# Patient Record
Sex: Male | Born: 1940 | Race: White | Hispanic: No | Marital: Married | Smoking: Former smoker
Health system: Southern US, Community
[De-identification: ages and names within clinical notes are randomized; demographics above are authoritative.]

## PROBLEM LIST (undated history)

## (undated) DIAGNOSIS — E7800 Pure hypercholesterolemia, unspecified: Secondary | ICD-10-CM

## (undated) DIAGNOSIS — E119 Type 2 diabetes mellitus without complications: Secondary | ICD-10-CM

## (undated) DIAGNOSIS — M109 Gout, unspecified: Secondary | ICD-10-CM

## (undated) DIAGNOSIS — K769 Liver disease, unspecified: Secondary | ICD-10-CM

## (undated) DIAGNOSIS — H9190 Unspecified hearing loss, unspecified ear: Secondary | ICD-10-CM

## (undated) DIAGNOSIS — I2510 Atherosclerotic heart disease of native coronary artery without angina pectoris: Secondary | ICD-10-CM

## (undated) DIAGNOSIS — G4739 Other sleep apnea: Secondary | ICD-10-CM

## (undated) DIAGNOSIS — I10 Essential (primary) hypertension: Secondary | ICD-10-CM

## (undated) DIAGNOSIS — N289 Disorder of kidney and ureter, unspecified: Secondary | ICD-10-CM

## (undated) DIAGNOSIS — I519 Heart disease, unspecified: Secondary | ICD-10-CM

## (undated) DIAGNOSIS — C61 Malignant neoplasm of prostate: Secondary | ICD-10-CM

## (undated) DIAGNOSIS — G4731 Primary central sleep apnea: Secondary | ICD-10-CM

## (undated) HISTORY — DX: Pure hypercholesterolemia, unspecified: E78.00

## (undated) HISTORY — DX: Gout, unspecified: M10.9

## (undated) HISTORY — PX: TONSILLECTOMY: SUR1361

## (undated) HISTORY — DX: Disorder of kidney and ureter, unspecified: N28.9

## (undated) HISTORY — DX: Liver disease, unspecified: K76.9

## (undated) HISTORY — PX: DOPPLER ECHOCARDIOGRAPHY: SHX263

## (undated) HISTORY — DX: Malignant neoplasm of prostate: C61

## (undated) HISTORY — DX: Essential (primary) hypertension: I10

## (undated) HISTORY — PX: CARDIAC CATHETERIZATION: SHX172

## (undated) HISTORY — DX: Other sleep apnea: G47.39

## (undated) HISTORY — DX: Type 2 diabetes mellitus without complications: E11.9

## (undated) HISTORY — DX: Atherosclerotic heart disease of native coronary artery without angina pectoris: I25.10

## (undated) HISTORY — DX: Heart disease, unspecified: I51.9

## (undated) HISTORY — DX: Primary central sleep apnea: G47.31

## (undated) HISTORY — PX: COLONOSCOPY: SHX174

---

## 1984-12-10 HISTORY — PX: CORONARY ARTERY BYPASS GRAFT: SHX141

## 1998-06-27 ENCOUNTER — Ambulatory Visit (HOSPITAL_COMMUNITY): Admission: RE | Admit: 1998-06-27 | Discharge: 1998-06-27 | Payer: Self-pay | Admitting: Cardiology

## 2004-02-17 ENCOUNTER — Ambulatory Visit (HOSPITAL_COMMUNITY): Admission: RE | Admit: 2004-02-17 | Discharge: 2004-02-17 | Payer: Self-pay | Admitting: Gastroenterology

## 2005-12-10 HISTORY — PX: HEMORRHOID SURGERY: SHX153

## 2006-10-07 ENCOUNTER — Encounter (INDEPENDENT_AMBULATORY_CARE_PROVIDER_SITE_OTHER): Payer: Self-pay | Admitting: *Deleted

## 2006-10-07 ENCOUNTER — Ambulatory Visit (HOSPITAL_COMMUNITY): Admission: RE | Admit: 2006-10-07 | Discharge: 2006-10-07 | Payer: Self-pay | Admitting: General Surgery

## 2006-12-10 HISTORY — PX: CARDIOVASCULAR STRESS TEST: SHX262

## 2009-06-22 ENCOUNTER — Ambulatory Visit (HOSPITAL_COMMUNITY): Admission: RE | Admit: 2009-06-22 | Discharge: 2009-06-22 | Payer: Self-pay | Admitting: Cardiology

## 2009-12-10 HISTORY — PX: CHOLECYSTECTOMY: SHX55

## 2010-01-04 ENCOUNTER — Ambulatory Visit (HOSPITAL_COMMUNITY): Admission: RE | Admit: 2010-01-04 | Discharge: 2010-01-05 | Payer: Self-pay | Admitting: Surgery

## 2010-01-04 ENCOUNTER — Encounter (INDEPENDENT_AMBULATORY_CARE_PROVIDER_SITE_OTHER): Payer: Self-pay | Admitting: Surgery

## 2011-02-25 LAB — COMPREHENSIVE METABOLIC PANEL
AST: 18 U/L (ref 0–37)
Albumin: 3.9 g/dL (ref 3.5–5.2)
Alkaline Phosphatase: 85 U/L (ref 39–117)
Chloride: 106 mEq/L (ref 96–112)
GFR calc Af Amer: 59 mL/min — ABNORMAL LOW (ref >60)
Potassium: 3.8 mEq/L (ref 3.5–5.1)
Total Bilirubin: 0.5 mg/dL (ref 0.3–1.2)

## 2011-04-24 NOTE — Cardiovascular Report (Signed)
NAMEELIJIAH, Bruce Gray NO.:  [id number]   MEDICAL RECORD NO.:  [id number]          PATIENT TYPE:  OIB   LOCATION:  2899                         FACILITY:  [facility]   PHYSICIAN:  Thereasa Solo. Little, M.D. DATE OF BIRTH:  Jun 25, 1941   DATE OF PROCEDURE:  06/22/2009  DATE OF DISCHARGE:                            CARDIAC CATHETERIZATION   INDICATIONS FOR TEST:  This 70 year old male had bypass surgery in 1986.  He had a repeat cath in 1996 because of chest burning.  His grafts were  patent.  He presented to my office as a work-in with chest discomfort 2  days ago with recurrent burning in his chest.  There is no associated  shortness of breath or awareness of any arrhythmias with this and since  there is clearly a change in his symptoms.   Because of this, the patient is brought in for cardiac catheterization.   PROCEDURE:  After obtaining informed consent, the patient was prepped  and draped in the usual sterile fashion exposing the right groin and  local anesthetic with 1% Xylocaine and Seldinger technique employed, a 5-  [place] introducer sheath was placed in the right femoral artery.  Left  and right coronary arteriography, graft visualization x3,  ventriculography, and aortic root injection x3 was performed.   COMPLICATIONS:  None.   TOTAL CONTRAST USED:  180 mL.   RESULTS:  Central aortic pressure 131/62.  Left ventricular pressure  138/7.  End-diastolic pressure 17.   VENTRICULOGRAPHY:  Ventriculography in the RAO projection performed at  the end of the case revealed mild global hypokinesis with an ejection  fraction of approximately 50%.  There was ectopy and +1 mitral  regurgitation and it was probably related to the PVCs.   Aortic root:  Aortic root was performed because I could not selectively  find the graft to the PDA.  After 3 injections, I reread Dr. Adele Dan  note from 1986 and there appears and in fact it is true that his PDA is  supplied by a  sequential graft.   CORONARY ARTERIOGRAPHY:  There was dense calcification of the RCA, left  main, proximal LAD, and proximal circumflex.  1. Left main:  Normal.  2. LAD:  There was ostial 80% calcification of the LAD.  The vessel      was 100% occluded after a very small septal perforator and the      distal vessel was grafted.  3. Circumflex:  100% occluded at the ostium.  4. Right coronary artery:  100% occluded in the mid portion with the      entire proximal segment diffusely diseased.   GRAFTS:  1. Saphenous vein graft sequentially to the diagonal and the OM.  The      proximal portion of graft had a tapering less than 40% area of      narrowing.  There was no thrombus in the graft and it was not a      flow-limiting lesion.  It supplied two very small vessels, an OM      and a diagonal.  There was retrograde filling  of the LAD from the      diagonal.  2. Saphenous vein graft, sequentially OM-2 and the PDA.  This graft      was greater than 4 mm in diameter.  It was widely patent.  OM-2 was      a large vessel free of disease.  The PDA was free of disease.  3. Internal mammary artery.  The internal mammary artery to the LAD      was small less than 2 mm.  As it inserts into the LAD, there was      obvious bidirectional flow.   CONCLUSION:  1. Widely patent grafts with minimal narrowing in the proximal portion      of the saphenous vein graft to the diagonal and obtuse marginal.  I      do not think this is a flow-limiting issue.  2. Diffuse native disease and it may very well be that the ostial 80%      left anterior descending disease is responsible for so much of      discomfort.  It supplies only a very small septal perforator.  This      entire vessel is small and less than 1.5 mm and diffusely      calcified.  It is not amenable to any type of intervention.   He is currently on beta-blockers and Norvasc.  He had been on ACE  inhibitors in the past but had an increase  in his creatinine.  This was  discontinued.  I plan to add Imdur 30 mg half a tablet a day to his  current medications.  We will hydrate him and check his renal functions  in 2 days when he comes for followup.           ______________________________  Thereasa Solo. Little, M.D.     ABL/MEDQ  D:  06/22/2009  T:  06/22/2009  Job:  [id number]   cc:   Tammy R. Collins [place], M.D.  Catheterization Laboratory

## 2011-04-27 NOTE — Op Note (Signed)
NAME:  JT, BRABEC NO.:  [id number]   MEDICAL RECORD NO.:  [id number]          PATIENT TYPE:  AMB   LOCATION:  DAY                          FACILITY:  [facility]   PHYSICIAN:  Timothy E. Earlene Plater, M.D. DATE OF BIRTH:  [birth year]   DATE OF PROCEDURE:  10/07/2006  DATE OF DISCHARGE:                                 OPERATIVE REPORT   PREOPERATIVE DIAGNOSIS:  Bleeding prolapsing hemorrhoids.   POSTOPERATIVE DIAGNOSIS:  Bleeding prolapsing hemorrhoids.   PROCEDURE:  PPH hemorrhoidectomy.   SURGEON:  Timothy E. Earlene Plater, M.D.   ANESTHESIA:  General.   Mr. Gerhold is 14, has visited my office on a number of occasions, and has  been treated with conservative management, dietary changes, stool softeners,  and sclerotherapy, and has continued symptoms of pressure, protrusion, and  frequent bright red bleeding.  Colonoscopy 2-1/2 years ago was negative.  After a number of visits and failed to control the symptoms, because of his  nervousness about this, he wishes to have surgery. This has been carefully  discussed, including the details of the [facility] and the expectations and  potential complications.   He was seen, evaluated by Dr. Julieanne Manson preoperatively, seen and  evaluated by anesthesia preoperatively.  Today, his laboratory data looks  satisfactory except for an unexpected elevation of BUN at 33, creatinine  1.9.  He is ready to proceed surgery.  He is seen, identified, and the  permit signed.   The patient was taken to the operating room and placed supine.  General  endotracheal anesthesia administered.  He was carefully placed in lithotomy.  Perianal area was inspected.  There were some prolapsed internal  hemorrhoids.  There was one prominent left posterior external tag that was  uncomplicated.  The anus was lubricated, and the flexible sigmoidoscope was  introduced and under direct vision was gently advanced to 50 cm.  No new or  bleeding lesions were seen.  The  scope was withdrawn.  The area was prepped  and draped in the usual fashion.  A mixture of Marcaine 0.25% with  epinephrine mixed 1:20 with Wydase was injected as a wide field block around  the perineum.  The operating anoscope was placed, generously lubricated.  Hemorrhoids were noted, most predominantly right anterior.  A 2-0 Prolene  pursestring suture was placed approximately 4 cm proximal to the dentate  line.  This appeared secure.  It was tested and was satisfactory.  The  operating anoscope was removed. The clear plastic short anoscope was  inserted.  The suture was brought through the center of that scope. This was  held firmly in place.  Then, the [facility] stapling device in the fully open  position was gently advanced into the clear anoscope until it popped through  the pursestring suture.  The pursestring suture was then tied down snugly  around the post of the stapling device.  The sutures was brought through the  post and the stapling mechanism.  With the proper alignment and pressure,  the stapler was closed, held for 30 seconds, fired, held for 30 seconds, and  removed.  There was some unusual bleeding, and there was a slight gap in the  doughnut.  I therefore quickly examined and looked, and the defect was  posterior for a distance of only about 1 cm.  This was then patched over and  sutured with interrupted 4-0 Vicryls, which completed the entire 360-degree  PPH hemorrhoidectomy.  No other lesions or bleeding was seen.  This was watched for total of 10 minutes.  A pack of Gelfoam wrapped in  Surgicel was placed. All counts were correct.  He tolerated it well and was  removed to the recovery room in good condition.  Written and verbal  instructions were given to him and his wife, including Vicodin, #36, and he  will be seen in followup in the office.      Timothy E. Earlene Plater, M.D.  Electronically Signed     TED/MEDQ  D:  10/07/2006  T:  10/08/2006  Job:  [id number]   cc:    Thereasa Solo. Little, M.D.  Fax: [phone]   Teena Irani. Arlyce Dice, M.D.  Fax: [phone]

## 2011-04-27 NOTE — Op Note (Signed)
NAME:  Bruce Gray, Bruce Gray                          ACCOUNT NO.:  [id number]   MEDICAL RECORD NO.:  [id number]                   PATIENT TYPE:  AMB   LOCATION:  ENDO                                 FACILITY:  [facility]   PHYSICIAN:  Graylin Shiver, M.D.                DATE OF BIRTH:  1941-08-31   DATE OF PROCEDURE:  02/17/2004  DATE OF DISCHARGE:                                 OPERATIVE REPORT   PROCEDURE:  Colonoscopy.   INDICATIONS FOR PROCEDURE:  Intermittent rectal bleeding.   CONSENT:  Informed consent was obtained after explanation of the risks of  bleeding, infection, and perforation.   PREMEDICATION:  Fentanyl 62.5 mcg IV, Versed 6 mg IV.   DESCRIPTION OF PROCEDURE:  With the patient in the left lateral decubitus  position, the rectal exam was performed.  No masses were felt.  Some small  external hemorrhoids were noted.  The Olympus colonoscope was inserted into  the rectum and advanced around the colon to the cecum.  Cecal landmarks were  identified.  The cecum and ascending colon were normal.  The transverse  colon was normal.  The descending colon, sigmoid and rectum were normal.  He  tolerated the procedure well without complications.   IMPRESSION:  Normal colonoscopy to the cecum with some small  hemorrhoids.                                               Graylin Shiver, M.D.    Bruce Gray  D:  02/17/2004  T:  02/17/2004  Job:  [id number]

## 2011-12-11 HISTORY — PX: INSERTION PROSTATE RADIATION SEED: SUR718

## 2012-11-20 ENCOUNTER — Other Ambulatory Visit (HOSPITAL_COMMUNITY): Payer: Self-pay | Admitting: Cardiovascular Disease

## 2012-11-20 DIAGNOSIS — I2510 Atherosclerotic heart disease of native coronary artery without angina pectoris: Secondary | ICD-10-CM

## 2012-12-19 ENCOUNTER — Ambulatory Visit (HOSPITAL_COMMUNITY)
Admission: RE | Admit: 2012-12-19 | Discharge: 2012-12-19 | Disposition: A | Discharge status: Home / Self Care | Payer: Medicare Other | Source: Ambulatory Visit | Attending: Cardiovascular Disease | Admitting: Cardiovascular Disease

## 2012-12-19 ENCOUNTER — Ambulatory Visit (HOSPITAL_COMMUNITY): Payer: Self-pay

## 2012-12-19 DIAGNOSIS — I10 Essential (primary) hypertension: Secondary | ICD-10-CM | POA: Insufficient documentation

## 2012-12-19 DIAGNOSIS — I4510 Unspecified right bundle-branch block: Secondary | ICD-10-CM | POA: Insufficient documentation

## 2012-12-19 DIAGNOSIS — I2510 Atherosclerotic heart disease of native coronary artery without angina pectoris: Secondary | ICD-10-CM

## 2012-12-19 DIAGNOSIS — Z8249 Family history of ischemic heart disease and other diseases of the circulatory system: Secondary | ICD-10-CM | POA: Insufficient documentation

## 2012-12-19 MED ORDER — TECHNETIUM TC 99M SESTAMIBI GENERIC - CARDIOLITE
31.0000 | Freq: Once | INTRAVENOUS | Status: AC | PRN
  Administered 2012-12-19: 31 via INTRAVENOUS

## 2012-12-19 MED ORDER — REGADENOSON 0.4 MG/5ML IV SOLN
0.4000 mg | Freq: Once | INTRAVENOUS | Status: AC
  Administered 2012-12-19: 0.4 mg via INTRAVENOUS

## 2012-12-19 MED ORDER — TECHNETIUM TC 99M SESTAMIBI GENERIC - CARDIOLITE
10.6000 | Freq: Once | INTRAVENOUS | Status: AC | PRN
  Administered 2012-12-19: 11 via INTRAVENOUS

## 2012-12-19 NOTE — Procedures (Addendum)
[facility] [address] [place] [address] [phone]  Cardiology Nuclear Med Study  Bruce Gray is a 72 y.o. male     MRN : [id number]     DOB: 1941-09-02  Procedure Date: 12/19/2012  Nuclear Med Background Indication for Stress Test:  Graft Patency History:  CABG x 5 1986 Cardiac Risk Factors: Family History - CAD, History of Smoking, Hypertension, Lipids and RBBB  Symptoms:  none   Nuclear Pre-Procedure Caffeine/Decaff Intake:  10:00pm NPO After: 8:00am   IV Site: R Antecubital  IV 0.9% NS with Angio Cath:  22g  Chest Size (in):  42 IV Started by: Koren Shiver, CNMT  Height: 5\' 7"  (1.702 m)  Cup Size: n/a  BMI:  Body mass index is 26.94 kg/(m^2). Weight:  172 lb (78.019 kg)   Tech Comments: held carvedilol, isosorbide and amlodipine 24 hrs prior to test    Nuclear Med Study 1 or 2 day study: 1 day  Stress Test Type:  Stress  Order Authorizing Provider:  Nicki Guadalajara, MD   Resting Radionuclide: Technetium 55m Sestamibi  Resting Radionuclide Dose: 10.6 mCi   Stress Radionuclide:  Technetium 18m Sestamibi  Stress Radionuclide Dose: 31.0 mCi           Stress Protocol Rest HR: 68 Stress HR: 102  Rest BP: 163/86 Stress BP: 150/71  Exercise Time (min): n/a METS: n/a          Dose of Adenosine (mg):  n/a Dose of Lexiscan: 0.4 mg  Dose of Atropine (mg): n/a Dose of Dobutamine: n/a mcg/kg/min (at max HR)  Stress Test Technologist: Ernestene Mention, CCT Nuclear Technologist: Gonzella Lex, CNMT   Rest Procedure:  Myocardial perfusion imaging was performed at rest 45 minutes following the intravenous administration of Technetium 76m Sestamibi. Stress Procedure:  The patient received IV Lexiscan 0.4 mg over 15-seconds.  Technetium 55m Sestamibi injected at 30-seconds.  There were no significant changes with Lexiscan.  Quantitative spect images were obtained after a 45 minute delay.  Transient Ischemic Dilatation  (Normal <1.22):  0.94 Lung/Heart Ratio (Normal <0.45):  0.42 QGS EDV:  62 ml QGS ESV:  18 ml LV Ejection Fraction: 71%  Signed by   Rest ECG: NSR - Normal EKG  Stress ECG: There are scattered PVCs.  QPS Raw Data Images:  Mild diaphragmatic attenuation.  Normal left ventricular size. Stress Images:  Small fixed apical thinning defect, basal inferior defect. Rest Images:  Small fixed apical thinning defect, basal inferior defect. Subtraction (SDS):  No evidence of ischemia.  Impression Exercise Capacity:  Lexiscan with no exercise. BP Response:  Normal blood pressure response. Clinical Symptoms:  No significant symptoms noted. ECG Impression:  There are scattered PACs. Comparison with Prior Nuclear Study: Prior NST in 2008 was non-ischemic as well.  Overall Impression:  Low risk stress nuclear study. Small fixed apical thinning defect. Basal inferior bowel artifact.  LV Wall Motion:  NL LV Function; NL Wall Motion  Chrystie Nose, MD, [facility] Attending Cardiologist [facility]  Chrystie Nose, MD  12/19/2012 1:16 PM

## 2012-12-24 ENCOUNTER — Ambulatory Visit (HOSPITAL_COMMUNITY)
Admission: RE | Admit: 2012-12-24 | Discharge: 2012-12-24 | Disposition: A | Discharge status: Home / Self Care | Payer: Medicare Other | Source: Ambulatory Visit | Attending: Cardiovascular Disease | Admitting: Cardiovascular Disease

## 2012-12-24 DIAGNOSIS — I369 Nonrheumatic tricuspid valve disorder, unspecified: Secondary | ICD-10-CM | POA: Insufficient documentation

## 2012-12-24 DIAGNOSIS — I059 Rheumatic mitral valve disease, unspecified: Secondary | ICD-10-CM | POA: Insufficient documentation

## 2012-12-24 DIAGNOSIS — I2510 Atherosclerotic heart disease of native coronary artery without angina pectoris: Secondary | ICD-10-CM | POA: Insufficient documentation

## 2012-12-24 DIAGNOSIS — E119 Type 2 diabetes mellitus without complications: Secondary | ICD-10-CM | POA: Insufficient documentation

## 2012-12-24 DIAGNOSIS — I379 Nonrheumatic pulmonary valve disorder, unspecified: Secondary | ICD-10-CM | POA: Insufficient documentation

## 2012-12-24 NOTE — Progress Notes (Signed)
[facility]   2D echo completed 12/24/2012.   Cindy Leita Lindbloom, RDCS   

## 2013-04-10 ENCOUNTER — Encounter: Payer: Self-pay | Admitting: Cardiovascular Disease

## 2013-07-03 ENCOUNTER — Encounter: Payer: Self-pay | Admitting: Cardiovascular Disease

## 2013-07-03 ENCOUNTER — Ambulatory Visit (INDEPENDENT_AMBULATORY_CARE_PROVIDER_SITE_OTHER): Payer: Self-pay | Admitting: Cardiovascular Disease

## 2013-07-03 VITALS — BP 140/78 | HR 75 | Ht 67.0 in | Wt 169.5 lb

## 2013-07-03 DIAGNOSIS — G4733 Obstructive sleep apnea (adult) (pediatric): Secondary | ICD-10-CM | POA: Insufficient documentation

## 2013-07-03 DIAGNOSIS — E785 Hyperlipidemia, unspecified: Secondary | ICD-10-CM

## 2013-07-03 DIAGNOSIS — E119 Type 2 diabetes mellitus without complications: Secondary | ICD-10-CM

## 2013-07-03 DIAGNOSIS — I10 Essential (primary) hypertension: Secondary | ICD-10-CM | POA: Insufficient documentation

## 2013-07-03 DIAGNOSIS — I2510 Atherosclerotic heart disease of native coronary artery without angina pectoris: Secondary | ICD-10-CM

## 2013-07-03 DIAGNOSIS — Z8546 Personal history of malignant neoplasm of prostate: Secondary | ICD-10-CM

## 2013-07-03 NOTE — Patient Instructions (Addendum)
Your physician recommends that you schedule a follow-up appointment in: 6 months  

## 2013-07-03 NOTE — Progress Notes (Signed)
Patient ID: Bruce Gray, male   DOB: [birth year], 72 y.o.   MRN: [id number]     HPI: Bruce Gray, is a 72 y.o. male who presents to the office today for six-month cardiology evaluation. He is a former patient of Dr. Caprice Kluver and to establish cardiology care with me in December 2013.  Bruce Gray underwent CABG revascularization surgery in 1986 the last catheterization in July 2000 and showed all his grafts to be patent but he had aggression of native coronary artery disease with total occlusion of his native RCA, total occlusion of the circumflex, and total occlusion of his LAD has a small septal perforating artery with ostial 80% left main stenosis. He had a sequential graft to a diagonal and obtuse marginal vessel, and vein sequentially to the OM 2 and PDA which were normal  Bruce Gray has a history of remote prostate cancer with seed implantation. History of type 2 diabetes mellitus, complex sleep apnea in 2009 was recommended that he undergo BiPAP adapt servoventilation titration. He apparently is not utilizing any therapy. He states he is sleeping 8 hours per night. He states his wife is has not noticed and snoring. He does not wish to pursue treatment at this time.  Bruce Gray underwent a nuclear perfusion an echo Doppler study in January 2014. Ejection fraciton was 55-60%; Grade 1 diastolic dysfunction. On nuclear imaging, myocardial perfusion was normal.   Since I last saw him, he did have an episode of vertical diplopia for which he did see an eye doctor.  He also lost a brother who had complex medical history and died in 2023-01-26. He tells me he recently was found to be anemic and apparently just sent off stool guaiacs but has not yet heard of the results.  From a cardiac standpoint, he denies recent chest pain. He is unaware of tachycardia palpitations. He denies bleeding. He denies significant shortness of breath.  Past Medical History  Diagnosis Date  . Prostate cancer     seed  implantation  . Diabetes mellitus   . Mild renal insufficiency   . CAD (coronary artery disease)   . Complex sleep apnea syndrome   . Gout     Past Surgical History  Procedure Laterality Date  . Coronary artery bypass graft  1986    Allergies  Allergen Reactions  . Prevacid (Lansoprazole) Nausea And Vomiting    Current Outpatient Prescriptions  Medication Sig Dispense Refill  . allopurinol (ZYLOPRIM) 100 MG tablet Take 100 mg by mouth daily.      Marland Kitchen amLODipine (NORVASC) 5 MG tablet Take 5 mg by mouth daily.      Marland Kitchen aspirin 81 MG tablet Take 81 mg by mouth daily.      Marland Kitchen atorvastatin (LIPITOR) 40 MG tablet Take 1 tablet by mouth daily.      . carvedilol (COREG) 6.25 MG tablet Take 1 tablet by mouth 2 (two) times daily.      Marland Kitchen docusate sodium (COLACE) 100 MG capsule Take 100 mg by mouth daily.      . furosemide (LASIX) 20 MG tablet Take 20 mg by mouth daily.      . isosorbide mononitrate (IMDUR) 30 MG 24 hr tablet Take 0.5 tablets by mouth daily.      . metFORMIN (GLUCOPHAGE-XR) 500 MG 24 hr tablet Take 500 mg by mouth daily.      . niacin 500 MG tablet Take 500 mg by mouth daily with breakfast.      .  omeprazole (PRILOSEC) 20 MG capsule Take 1 capsule by mouth daily.      . Saw Palmetto, Serenoa repens, (SAW PALMETTO PO) Take 900 mg by mouth daily.      . tamsulosin (FLOMAX) 0.4 MG CAPS Take 1 capsule by mouth daily.       No current facility-administered medications for this visit.    Socially he is married for 52 years. 2 children 4 grandchildren. He is retired. Completed 12th grade education. Is no tobacco or alcohol use.  ROS is negative for fevers, chills or night sweats. He denies any awareness of palpitations. He denies presyncope or syncope. There is no wheezing. He denies chest pressure. He states he is sleeping at least 8 hours per night. He is unaware of any snoring. He has not been utilizing any CPAP or BiPAP therapy. Denies daytime sleepiness he is unaware of bright  red blood per rectum. He denies edema. He denies paresthesias to  Other system review is negative.  PE BP 140/78  Pulse 75  Ht 5\' 7"  (1.702 m)  Wt 169 lb 8 oz (76.885 kg)  BMI 26.54 kg/m2  General: Alert, oriented, no distress.  Skin: normal turgor, no rashes HEENT: Normocephalic, atraumatic. Pupils round and reactive; sclera anicteric;no lid lag.  Nose without nasal septal hypertrophy Mouth/Parynx benign; Mallinpatti scale 2/3 Neck: No JVD, no carotid briuts Lungs: clear to ausculatation and percussion; no wheezing or rales Heart: RRR, s1 s2 normal 1/6 systolic murmur; no s3 Abdomen: soft, nontender; no hepatosplenomehaly, BS+; abdominal aorta nontender and not dilated by palpation. Pulses 2+ Extremities: no clubbing cyanosis or edema, Homan's sign negative  Neurologic: grossly nonfocal  ECG: Sinus rhythm with a premature atrial complex. Nonspecific T wave abnormality. QTc interval 381 ms.  LABS:  BMET    Component Value Date/Time   NA 140 01/03/2010 1420   K 3.8 01/03/2010 1420   CL 106 01/03/2010 1420   CO2 24 01/03/2010 1420   GLUCOSE 97 01/03/2010 1420   BUN 24* 01/03/2010 1420   CREATININE 1.44 01/03/2010 1420   CALCIUM 9.5 01/03/2010 1420   GFRNONAA 49* 01/03/2010 1420   GFRAA  Value: 59        The eGFR has been calculated using the MDRD equation. This calculation has not been validated in all clinical situations. eGFR's persistently <60 mL/min signify possible Chronic Kidney Disease.* 01/03/2010 1420     Hepatic Function Panel     Component Value Date/Time   PROT 6.9 01/03/2010 1420   ALBUMIN 3.9 01/03/2010 1420   AST 18 01/03/2010 1420   ALT 15 01/03/2010 1420   ALKPHOS 85 01/03/2010 1420   BILITOT 0.5 01/03/2010 1420     CBC No results found for this basename: wbc, rbc, hgb, hct, plt, mcv, mch, mchc, rdw, neutrabs, lymphsabs, monoabs, eosabs, basosabs     BNP No results found for this basename: probnp    Lipid Panel  No results found for this basename: chol,  trig, hdl, cholhdl, vldl, ldlcalc     RADIOLOGY: No results found.  ASSESSMENT AND PLAN: The cardiac standpoint, Bruce. Navarro is now 28 years since his CABG revascularization surgery. He has documented native coronary artery occlusion as noted above with patent grafts. His last nuclear study done in January 2014 continued to show fairly normal perfusion with only inferior bowel artifact noted. When I last saw he was in little pain and higher than the recommended dose of simvastatin. Consequently, I discontinued simvastatin and switched him to atorvastatin 40 mg.  He has tolerated this therapy and continues to take niacin 500 mg. Subsequent blood in April 2014 showed a total cholesterol now 146 triglycerides 140 HDL 42 LDL 76 on his combination therapy the he was recently found to be mildly anemic. Stool guaiac so recently been sent off. He may ultimately require colonoscopy. Cardiac standpoint he is doing well without anginal symptomatology. His blood pressure is now well controlled. There is a rare PAC. His lipid status is very good on current therapy. As long as he remains stable I will see him in 6 months for cardiology reevaluation.       Lennette Bihari, MD, [facility]  07/03/2013 10:29 AM

## 2013-07-15 ENCOUNTER — Other Ambulatory Visit: Payer: Self-pay

## 2013-07-16 ENCOUNTER — Encounter: Payer: Self-pay | Admitting: *Deleted

## 2013-07-16 ENCOUNTER — Encounter: Payer: Self-pay | Admitting: Cardiovascular Disease

## 2013-07-16 NOTE — Telephone Encounter (Signed)
Message forwarded to W. Waddell, CMA.  

## 2013-10-15 ENCOUNTER — Other Ambulatory Visit: Payer: Self-pay

## 2013-12-22 ENCOUNTER — Ambulatory Visit (INDEPENDENT_AMBULATORY_CARE_PROVIDER_SITE_OTHER): Payer: Medicare PPO | Admitting: Cardiovascular Disease

## 2013-12-22 VITALS — BP 110/72 | HR 87 | Ht 67.0 in | Wt 169.6 lb

## 2013-12-22 DIAGNOSIS — E785 Hyperlipidemia, unspecified: Secondary | ICD-10-CM

## 2013-12-22 DIAGNOSIS — Z8546 Personal history of malignant neoplasm of prostate: Secondary | ICD-10-CM

## 2013-12-22 DIAGNOSIS — E119 Type 2 diabetes mellitus without complications: Secondary | ICD-10-CM

## 2013-12-22 DIAGNOSIS — I2510 Atherosclerotic heart disease of native coronary artery without angina pectoris: Secondary | ICD-10-CM

## 2013-12-22 DIAGNOSIS — G4733 Obstructive sleep apnea (adult) (pediatric): Secondary | ICD-10-CM

## 2013-12-22 DIAGNOSIS — I10 Essential (primary) hypertension: Secondary | ICD-10-CM

## 2013-12-22 MED ORDER — CARVEDILOL 6.25 MG PO TABS: 6.2500 mg | ORAL_TABLET | Freq: Two times a day (BID) | ORAL | Status: DC

## 2013-12-22 MED ORDER — OMEPRAZOLE 20 MG PO CPDR: 20.0000 mg | DELAYED_RELEASE_CAPSULE | Freq: Every day | ORAL | Status: DC

## 2013-12-22 MED ORDER — AMLODIPINE BESYLATE 5 MG PO TABS: 5.0000 mg | ORAL_TABLET | Freq: Every day | ORAL | Status: DC

## 2013-12-22 MED ORDER — ISOSORBIDE MONONITRATE ER 30 MG PO TB24: 15.0000 mg | ORAL_TABLET | Freq: Every day | ORAL | Status: DC

## 2013-12-22 MED ORDER — ATORVASTATIN CALCIUM 40 MG PO TABS: 40.0000 mg | ORAL_TABLET | Freq: Every day | ORAL | Status: DC

## 2013-12-22 NOTE — Patient Instructions (Signed)
Your physician recommends that you schedule a follow-up appointment in: 1 YEAR. No changes were made today in your therapy.

## 2014-01-14 ENCOUNTER — Encounter: Payer: Self-pay | Admitting: Cardiovascular Disease

## 2014-01-14 NOTE — Progress Notes (Signed)
Patient ID: Bruce Gray, male   DOB: 19-Aug-1941, 73 y.o.   MRN: [id number]     HPI: Bruce Gray is a 73 y.o. male who is a former patient of Dr. Rex Kras. I saw him one year ago in January 2014.  Mr. Santone has a history of known coronary artery disease and in 1996 underwent CABG revascularization surgery the has a remote history of prostate CA treated with seed implantation, history of type 2 diabetes mellitus, mild renal insufficiency and also has a history of complex sleep apnea for which he has refused to be treated. There also is a history of gout.  An echo Doppler study in January 2014 showed normal systolic function with ejection fraction of 55-60% with grade 1 diastolic dysfunction. There is mild primary hypertension with estimated PA pressure 34 mm. He had mild atrial dilatation.  His last nuclear perfusion study in January 2014 showed normal perfusion and function with apical thinning.  Over the past year, he has continued to be fairly stable. He does have GERD for which he takes omeprazole. He has started to exercise at the [facility]. He denies recent anginal symptoms. He is unaware of palpitations. He does take medication for blood pressure. He denies leg swelling. He has a history of hyperlipidemia and as tolerated atorvastatin.  Past Medical History  Diagnosis Date  . Prostate cancer     seed implantation  . Diabetes mellitus   . Mild renal insufficiency   . CAD (coronary artery disease)   . Complex sleep apnea syndrome   . Gout     Past Surgical History  Procedure Laterality Date  . Coronary artery bypass graft  1986    Allergies  Allergen Reactions  . Prevacid [Lansoprazole] Nausea And Vomiting    Current Outpatient Prescriptions  Medication Sig Dispense Refill  . allopurinol (ZYLOPRIM) 100 MG tablet Take 200 mg by mouth daily.       Marland Kitchen amLODipine (NORVASC) 5 MG tablet Take 1 tablet (5 mg total) by mouth daily.  90 tablet  3  . aspirin 81 MG tablet Take 81 mg by  mouth daily.      Marland Kitchen atorvastatin (LIPITOR) 40 MG tablet Take 1 tablet (40 mg total) by mouth daily.  90 tablet  3  . carvedilol (COREG) 6.25 MG tablet Take 1 tablet (6.25 mg total) by mouth 2 (two) times daily.  180 tablet  3  . docusate sodium (COLACE) 100 MG capsule Take 100 mg by mouth daily.      . furosemide (LASIX) 20 MG tablet Take 20 mg by mouth daily.      . isosorbide mononitrate (IMDUR) 30 MG 24 hr tablet Take 0.5 tablets (15 mg total) by mouth daily.  45 tablet  3  . metFORMIN (GLUCOPHAGE-XR) 500 MG 24 hr tablet Take 500 mg by mouth daily.      . niacin 500 MG tablet Take 500 mg by mouth daily with breakfast.      . omeprazole (PRILOSEC) 20 MG capsule Take 1 capsule (20 mg total) by mouth daily.  90 capsule  3  . tamsulosin (FLOMAX) 0.4 MG CAPS Take 1 capsule by mouth daily.       No current facility-administered medications for this visit.    History   Social History  . Marital Status: Married    Spouse Name: N/A    Number of Children: 2  . Years of Education: 12   Occupational History  . retired    Science writer  History Main Topics  . Smoking status: Former Smoker    Quit date: 07/03/1958  . Smokeless tobacco: Never Used  . Alcohol Use: No     Comment: occasional  . Drug Use: No  . Sexual Activity: Not on file   Other Topics Concern  . Not on file   Social History Narrative  . No narrative on file    Family History  Problem Relation Age of Onset  . Cancer - Other Mother     breast  . Stroke Father     ROS is negative for fevers, chills or night sweats. He denies headaches. He denies change in vision or hearing. He is unaware of lymphadenopathy. He denies wheezing. He denies change in exercise. There is no shortness of breath. He denies presyncope or syncope. There is no exertional chest tightness. He denies nausea vomiting. He does not GERD. He denies blood in stool or urine. He denies recent leg swelling. He denies myalgias. He denies tremors. There is no  diabetes. He denies cold or heat intolerance. There is a history of gout. He is in CPAP therapy for his obstructive sleep apnea.   Other comprehensive 14 point system review is negative.  PE BP 110/72  Pulse 87  Ht $R'5\' 7"'Yj$  (1.702 m)  Wt 169 lb 9.6 oz (76.93 kg)  BMI 26.56 kg/m2  General: Alert, oriented, no distress.  Skin: normal turgor, no rashes HEENT: Normocephalic, atraumatic. Pupils round and reactive; sclera anicteric;no lid lag. Extraocular muscles intact. Nose without nasal septal hypertrophy Mouth/Parynx benign; Mallinpatti scale 3 Neck: No JVD, no carotid bruits; normal carotid upstroke Lungs: clear to ausculatation and percussion; no wheezing or rales Chest wall: no tenderness to palpitation Heart: RRR, s1 s2 normal 1/6 systolic murmur along the left sternal border. No gallop or rubs. Abdomen: soft, nontender; no hepatosplenomehaly, BS+; abdominal aorta nontender and not dilated by palpation. Back: no CVA tenderness Pulses 2+ Extremities: no clubbing cyanosis or edema, Homan's sign negative  Neurologic: grossly nonfocal; cranial nerves grossly normal. Psychologic: normal affect and mood.  ECG (independently read by me): Sinus rhythm 87 beats per minute. Complete left bundle-branch block. Nonspecific T changes.  LABS:  BMET    Component Value Date/Time   NA 140 01/03/2010 1420   K 3.8 01/03/2010 1420   CL 106 01/03/2010 1420   CO2 24 01/03/2010 1420   GLUCOSE 97 01/03/2010 1420   BUN 24* 01/03/2010 1420   CREATININE 1.44 01/03/2010 1420   CALCIUM 9.5 01/03/2010 1420   GFRNONAA 49* 01/03/2010 1420   GFRAA  Value: 59        The eGFR has been calculated using the MDRD equation. This calculation has not been validated in all clinical situations. eGFR's persistently <60 mL/min signify possible Chronic Kidney Disease.* 01/03/2010 1420     Hepatic Function Panel     Component Value Date/Time   PROT 6.9 01/03/2010 1420   ALBUMIN 3.9 01/03/2010 1420   AST 18 01/03/2010 1420   ALT  15 01/03/2010 1420   ALKPHOS 85 01/03/2010 1420   BILITOT 0.5 01/03/2010 1420     CBC No results found for this basename: wbc, rbc, hgb, hct, plt, mcv, mch, mchc, rdw, neutrabs, lymphsabs, monoabs, eosabs, basosabs     BNP No results found for this basename: probnp    Lipid Panel  No results found for this basename: chol, trig, hdl, cholhdl, vldl, ldlcalc     RADIOLOGY: No results found.    ASSESSMENT AND PLAN: Mr. Cobin  Leroy Sea is now 73 years old. He is status post CABG surgery in 1986. His last cardiac catheterization was done by Dr. Rex Kras in July 2010 which showed dense calcification of his native coronary arteries. All native vessels were occluded proximally. The headache pain sequential graft to the diagonal and OM with a proximal tapering of 40%. The sequential graft to the own to a PDA was large caliber and widely patent. His LIMA graft to the LAD was small and patent. He continues to be chest pain-free on his current medical regimen. He does have hyperlipidemia and has been taking niacin in addition to atorvastatin 40 mg. His blood pressure today is stable at 118/70. He's not having any significant edema on his current dose of furosemide 20 mg. I am recommending complete her laboratory be checked and if necessary adjustments will be made to his medical regimen depending upon the laboratory results. As long as he remains stable, we'll see him in one year for followup evaluation.     Troy Sine, MD, [facility]  01/14/2014 4:13 PM

## 2014-01-27 ENCOUNTER — Encounter: Payer: Self-pay | Admitting: Cardiovascular Disease

## 2014-08-12 ENCOUNTER — Other Ambulatory Visit: Payer: Self-pay | Admitting: Physician Assistant

## 2014-08-17 ENCOUNTER — Encounter (HOSPITAL_BASED_OUTPATIENT_CLINIC_OR_DEPARTMENT_OTHER): Payer: Self-pay | Admitting: *Deleted

## 2014-08-17 ENCOUNTER — Encounter (HOSPITAL_BASED_OUTPATIENT_CLINIC_OR_DEPARTMENT_OTHER)
Admission: RE | Admit: 2014-08-17 | Discharge: 2014-08-17 | Disposition: A | Discharge status: Home / Self Care | Payer: Medicare PPO | Source: Ambulatory Visit | Attending: Orthopedic Surgery | Admitting: Orthopedic Surgery

## 2014-08-17 DIAGNOSIS — Y929 Unspecified place or not applicable: Secondary | ICD-10-CM | POA: Diagnosis not present

## 2014-08-17 DIAGNOSIS — I10 Essential (primary) hypertension: Secondary | ICD-10-CM | POA: Diagnosis not present

## 2014-08-17 DIAGNOSIS — I2510 Atherosclerotic heart disease of native coronary artery without angina pectoris: Secondary | ICD-10-CM | POA: Diagnosis not present

## 2014-08-17 DIAGNOSIS — M109 Gout, unspecified: Secondary | ICD-10-CM | POA: Diagnosis not present

## 2014-08-17 DIAGNOSIS — IMO0002 Reserved for concepts with insufficient information to code with codable children: Secondary | ICD-10-CM | POA: Diagnosis present

## 2014-08-17 DIAGNOSIS — E119 Type 2 diabetes mellitus without complications: Secondary | ICD-10-CM | POA: Diagnosis not present

## 2014-08-17 DIAGNOSIS — Z885 Allergy status to narcotic agent status: Secondary | ICD-10-CM | POA: Diagnosis not present

## 2014-08-17 DIAGNOSIS — Z87891 Personal history of nicotine dependence: Secondary | ICD-10-CM | POA: Diagnosis not present

## 2014-08-17 DIAGNOSIS — X500XXA Overexertion from strenuous movement or load, initial encounter: Secondary | ICD-10-CM | POA: Diagnosis not present

## 2014-08-17 DIAGNOSIS — Z951 Presence of aortocoronary bypass graft: Secondary | ICD-10-CM | POA: Diagnosis not present

## 2014-08-17 LAB — BASIC METABOLIC PANEL
Anion gap: 14 (ref 5–15)
BUN: 18 mg/dL (ref 6–23)
CHLORIDE: 104 meq/L (ref 96–112)
CO2: 23 mEq/L (ref 19–32)
CREATININE: 1.16 mg/dL (ref 0.50–1.35)
Calcium: 9.3 mg/dL (ref 8.4–10.5)
GFR calc Af Amer: 69 mL/min — ABNORMAL LOW (ref >90)
GFR calc non Af Amer: 60 mL/min — ABNORMAL LOW (ref >90)
GLUCOSE: 119 mg/dL — AB (ref 70–99)
POTASSIUM: 4 meq/L (ref 3.7–5.3)
Sodium: 141 mEq/L (ref 137–147)

## 2014-08-17 NOTE — Progress Notes (Signed)
To come in for bmet-had ekg-2/15-last cardiac testing normal Does not use a cpap-to bring all meds and overnight bag in case he has to stay

## 2014-08-18 NOTE — H&P (Signed)
[facility] [address] [place], [place] [address] [phone] A Division of [facility]  Ninetta Lights, M.D.   Robert A. Noemi Chapel, M.D.   Faythe Casa, M.D.   Johnny Bridge, M.D.   Almedia Balls, M.D Ernesta Amble. Percell Miller, M.D.  Joseph Pierini, M.D.  Lanier Prude, M.D.    Verner Chol, M.D. Mary L. Fenton Malling, PA-C  Kirstin A. Shepperson, PA-C  Josh [place], PA-C [place], [place]   RE: Bruce, Gray                                [phone]      DOB: [birth year] INITIAL EVALUATION:  08-06-14 Bruce Gray is a new patient to the office.  I have taken care of his wife.  He is a 73 year-old male with a relatively acute onset of mechanical symptoms medial aspect, left knee.  He was walking, twisted and developed acute pain in the medial side of his knee.  Marked mechanical symptoms since.  This occurred earlier this week.  Able to bear weight, but he is using crutches.  Very uncomfortable.  No previous significant knee problems.  He does have a history of gout.  He is on Allopurinol, but nothing that he is describing sounds like an inflammatory event.   Remaining history is reviewed, updated and included in the chart.  This is significant for diabetes and hypertension.  Coronary artery bypass surgery in 1986.  Followed by Dr. Georgina Peer.  Last visit was in January of this year and he has been told he is doing well without ongoing issues.   Allergies: GI upset from Percocet and Codeine.    EXAMINATION: General exam is outlined and included in the chart.  Specifically, healthy appearing 73 year-old male.  Height: 5?6.  Weight: 168 pounds.  Markedly antalgic gait on the left, that he can barely put his foot down.  He has no acutely inflamed joints.  Left knee exquisitely tender medial joint line.  Positive McMurray's.  Not a lot of grating or crepitus.  Motion 0-100, being limited by pain.  Ligaments stable.  Neurovascularly  intact distally.  Opposite right knee does not have any joint line tenderness.  Good motion.  Good stability.        X-RAYS: Four view standing x-rays show some moderate narrowing of the medial compartment in both knees.  Evidence of some calcification popliteal artery.  He also has staples from his bypass surgery.   IMPRESSION: Acute medial meniscus tear, left knee.  Exquisite mechanical symptoms.    PLAN: I am going to give him an intraarticular Cortisone injection for comfort and relief for now.  He has enough in the way of findings however to get down to his diagnosis.  MRI scan to look at his meniscus.  I am going to follow up with him after his injection and after his scan to make a decision about treatment.  We have already touched upon the fact that this is probably a meniscus tear that is going to have to be addressed.  Final decision after we see his scan.  I am going to obviously have to get some cardiac clearance if we have to proceed.  Continued  [facility] [address] [place], [place] [address] [phone] A Division of [facility]  Ninetta Lights, M.D.  Robert A. Noemi Chapel, M.D.   Faythe Casa, M.D.   Johnny Bridge, M.D.   Almedia Balls, M.D Ernesta Amble. Percell Miller, M.D.  Joseph Pierini, M.D.  Lanier Prude, M.D.    Verner Chol, M.D. Mary L. Fenton Malling, PA-C  Kirstin A. Shepperson, PA-C  Josh McClave, PA-C [place], [place]   RE: Bruce, Gray                                [phone]      DOB: 1941/06/30 PROGRESS NOTE: 08-06-14 PROCEDURE NOTE: The patient's clinical condition is marked by substantial pain and/or significant functional disability.  Other conservative therapy has not provided relief, is contraindicated, or not appropriate.  There is a reasonable likelihood that injection will significantly improve the patient's pain and/or functional disability. After appropriate consent and  under sterile technique intraarticular injection of the left knee with Depo-Medrol/Marcaine.  Tolerated this well.       Ninetta Lights, M.D.   Electronically verified by Ninetta Lights, M.D. DFM:jjh Cc: Dr. Florina Ou, fax: [phone] Cc: Dr. Shelva Majestic, fax: [phone] D 08-06-14 T 08-09-14  [facility] [address] [place], [place] [address] [phone] A Division of [facility]  Ninetta Lights, M.D.   Robert A. Noemi Chapel, M.D.   Faythe Casa, M.D.   Johnny Bridge, M.D.   Almedia Balls, M.D Ernesta Amble. Percell Miller, M.D.  Joseph Pierini, M.D.  Lanier Prude, M.D.    Verner Chol, M.D. Mary L. Fenton Malling, PA-C  Kirstin A. Shepperson, PA-C  Josh [place], PA-C [place], [place]   RE: Bruce, Gray                                [phone]      DOB: 18-Mar-1941 PROGRESS NOTE: 08-10-14 Bruce Gray comes in for follow up.  He continues to have marked mechanical symptoms.  Cannot straighten his knee out.  MRI is complete.  When I saw him in the office I looked at the scan itself and I think he definitely has a displaced meniscus tear.  Fortunately I am not seeing a lot of other marked degenerative changes.  There are some areas of wear medial compartment, but again this does not look that profound.  By the time of this dictation his official report had come through showing free edge tearing medial meniscus.  Official report doesn't show a large displaced fragment, but based on his clinical findings and what I saw, I think that is the case.  There are certainly sufficient mechanical symptoms to warrant proceeding.  More than 25 minutes spent face-to-face with Bruce Gray and his wife.  We are going to set up exam under anesthesia, arthroscopy.  Because of his diabetes I want to try to do this as a first morning case.  We are going to set it up for next week.  Be cautious in the interim.  They understand and agree.  I will see  him at the time of operative intervention.    Ninetta Lights, M.D.   Electronically verified by Ninetta Lights, M.D. DFM:jjh D 08-11-14 T 08-11-14

## 2014-08-20 ENCOUNTER — Encounter (HOSPITAL_BASED_OUTPATIENT_CLINIC_OR_DEPARTMENT_OTHER)
Admission: RE | Disposition: A | Discharge status: Home / Self Care | Payer: Self-pay | Source: Ambulatory Visit | Attending: Orthopedic Surgery

## 2014-08-20 ENCOUNTER — Encounter (HOSPITAL_BASED_OUTPATIENT_CLINIC_OR_DEPARTMENT_OTHER): Payer: Self-pay | Admitting: *Deleted

## 2014-08-20 ENCOUNTER — Ambulatory Visit (HOSPITAL_BASED_OUTPATIENT_CLINIC_OR_DEPARTMENT_OTHER): Payer: Medicare PPO | Admitting: Anesthesiology

## 2014-08-20 ENCOUNTER — Ambulatory Visit (HOSPITAL_BASED_OUTPATIENT_CLINIC_OR_DEPARTMENT_OTHER)
Admission: RE | Admit: 2014-08-20 | Discharge: 2014-08-20 | Disposition: A | Discharge status: Home / Self Care | Payer: Medicare PPO | Source: Ambulatory Visit | Attending: Orthopedic Surgery | Admitting: Orthopedic Surgery

## 2014-08-20 ENCOUNTER — Encounter (HOSPITAL_BASED_OUTPATIENT_CLINIC_OR_DEPARTMENT_OTHER): Payer: Medicare PPO | Admitting: Anesthesiology

## 2014-08-20 DIAGNOSIS — IMO0002 Reserved for concepts with insufficient information to code with codable children: Secondary | ICD-10-CM | POA: Insufficient documentation

## 2014-08-20 DIAGNOSIS — Z87891 Personal history of nicotine dependence: Secondary | ICD-10-CM | POA: Insufficient documentation

## 2014-08-20 DIAGNOSIS — Y929 Unspecified place or not applicable: Secondary | ICD-10-CM | POA: Insufficient documentation

## 2014-08-20 DIAGNOSIS — M109 Gout, unspecified: Secondary | ICD-10-CM | POA: Diagnosis not present

## 2014-08-20 DIAGNOSIS — Z885 Allergy status to narcotic agent status: Secondary | ICD-10-CM | POA: Insufficient documentation

## 2014-08-20 DIAGNOSIS — I10 Essential (primary) hypertension: Secondary | ICD-10-CM | POA: Diagnosis not present

## 2014-08-20 DIAGNOSIS — Z951 Presence of aortocoronary bypass graft: Secondary | ICD-10-CM | POA: Insufficient documentation

## 2014-08-20 DIAGNOSIS — I2510 Atherosclerotic heart disease of native coronary artery without angina pectoris: Secondary | ICD-10-CM | POA: Diagnosis not present

## 2014-08-20 DIAGNOSIS — E119 Type 2 diabetes mellitus without complications: Secondary | ICD-10-CM | POA: Insufficient documentation

## 2014-08-20 DIAGNOSIS — X500XXA Overexertion from strenuous movement or load, initial encounter: Secondary | ICD-10-CM | POA: Insufficient documentation

## 2014-08-20 HISTORY — PX: KNEE ARTHROSCOPY WITH MEDIAL MENISECTOMY: SHX5651

## 2014-08-20 HISTORY — DX: Unspecified hearing loss, unspecified ear: H91.90

## 2014-08-20 LAB — POCT HEMOGLOBIN-HEMACUE
HEMOGLOBIN: 11.3 g/dL — AB (ref 13.0–17.0)
Hemoglobin: 11.9 g/dL — ABNORMAL LOW (ref 13.0–17.0)

## 2014-08-20 LAB — GLUCOSE, CAPILLARY
Glucose-Capillary: 121 mg/dL — ABNORMAL HIGH (ref 70–99)
Glucose-Capillary: 103 mg/dL — ABNORMAL HIGH (ref 70–99)

## 2014-08-20 SURGERY — ARTHROSCOPY, KNEE, WITH MEDIAL MENISCECTOMY
Anesthesia: General | Site: Knee | Laterality: Left

## 2014-08-20 MED ORDER — MIDAZOLAM HCL 5 MG/5ML IJ SOLN
INTRAMUSCULAR | Status: DC | PRN
  Administered 2014-08-20: 1 mg via INTRAVENOUS

## 2014-08-20 MED ORDER — HYDROMORPHONE HCL PF 1 MG/ML IJ SOLN: 0.2500 mg | INTRAMUSCULAR | Status: DC | PRN

## 2014-08-20 MED ORDER — CEFAZOLIN SODIUM-DEXTROSE 2-3 GM-% IV SOLR
INTRAVENOUS | Status: AC
  Filled 2014-08-20: qty 50

## 2014-08-20 MED ORDER — BUPIVACAINE HCL (PF) 0.5 % IJ SOLN
INTRAMUSCULAR | Status: AC
  Filled 2014-08-20: qty 30

## 2014-08-20 MED ORDER — LACTATED RINGERS IV SOLN: INTRAVENOUS | Status: DC

## 2014-08-20 MED ORDER — MIDAZOLAM HCL 2 MG/2ML IJ SOLN
INTRAMUSCULAR | Status: AC
  Filled 2014-08-20: qty 2

## 2014-08-20 MED ORDER — FENTANYL CITRATE 0.05 MG/ML IJ SOLN
INTRAMUSCULAR | Status: AC
  Filled 2014-08-20: qty 4

## 2014-08-20 MED ORDER — MEPERIDINE HCL 25 MG/ML IJ SOLN: 6.2500 mg | INTRAMUSCULAR | Status: DC | PRN

## 2014-08-20 MED ORDER — ONDANSETRON HCL 4 MG/2ML IJ SOLN: 4.0000 mg | Freq: Once | INTRAMUSCULAR | Status: DC | PRN

## 2014-08-20 MED ORDER — LACTATED RINGERS IV SOLN
INTRAVENOUS | Status: DC
  Administered 2014-08-20 (×2): via INTRAVENOUS

## 2014-08-20 MED ORDER — METHYLPREDNISOLONE ACETATE 40 MG/ML IJ SUSP
INTRAMUSCULAR | Status: AC
  Filled 2014-08-20: qty 1

## 2014-08-20 MED ORDER — FENTANYL CITRATE 0.05 MG/ML IJ SOLN: 50.0000 ug | INTRAMUSCULAR | Status: DC | PRN

## 2014-08-20 MED ORDER — CEFAZOLIN SODIUM-DEXTROSE 2-3 GM-% IV SOLR
2.0000 g | INTRAVENOUS | Status: AC
  Administered 2014-08-20: 2 g via INTRAVENOUS

## 2014-08-20 MED ORDER — METHYLPREDNISOLONE ACETATE 80 MG/ML IJ SUSP
INTRAMUSCULAR | Status: DC | PRN
  Administered 2014-08-20: 80 mg via INTRA_ARTICULAR

## 2014-08-20 MED ORDER — EPHEDRINE SULFATE 50 MG/ML IJ SOLN
INTRAMUSCULAR | Status: DC | PRN
  Administered 2014-08-20: 10 mg via INTRAVENOUS

## 2014-08-20 MED ORDER — FENTANYL CITRATE 0.05 MG/ML IJ SOLN
INTRAMUSCULAR | Status: DC | PRN
  Administered 2014-08-20 (×2): 25 ug via INTRAVENOUS
  Administered 2014-08-20: 50 ug via INTRAVENOUS

## 2014-08-20 MED ORDER — SODIUM CHLORIDE 0.9 % IR SOLN
Status: DC | PRN
  Administered 2014-08-20: 4500 mL

## 2014-08-20 MED ORDER — METHYLPREDNISOLONE ACETATE 80 MG/ML IJ SUSP
INTRAMUSCULAR | Status: AC
  Filled 2014-08-20: qty 1

## 2014-08-20 MED ORDER — DEXAMETHASONE SODIUM PHOSPHATE 4 MG/ML IJ SOLN
INTRAMUSCULAR | Status: DC | PRN
  Administered 2014-08-20: 4 mg via INTRAVENOUS

## 2014-08-20 MED ORDER — PROPOFOL 10 MG/ML IV BOLUS
INTRAVENOUS | Status: DC | PRN
  Administered 2014-08-20: 150 mg via INTRAVENOUS

## 2014-08-20 MED ORDER — OXYCODONE HCL 5 MG PO TABS: 5.0000 mg | ORAL_TABLET | ORAL | Status: DC | PRN

## 2014-08-20 MED ORDER — BUPIVACAINE HCL (PF) 0.5 % IJ SOLN
INTRAMUSCULAR | Status: DC | PRN
  Administered 2014-08-20: 20 mL via INTRA_ARTICULAR

## 2014-08-20 MED ORDER — OXYCODONE HCL 5 MG PO TABS: 5.0000 mg | ORAL_TABLET | Freq: Once | ORAL | Status: DC | PRN

## 2014-08-20 MED ORDER — CHLORHEXIDINE GLUCONATE 4 % EX LIQD
60.0000 mL | Freq: Once | CUTANEOUS | Status: DC
Start: 2014-08-20 — End: 2014-08-20

## 2014-08-20 MED ORDER — MIDAZOLAM HCL 2 MG/2ML IJ SOLN: 1.0000 mg | INTRAMUSCULAR | Status: DC | PRN

## 2014-08-20 MED ORDER — OXYCODONE HCL 5 MG/5ML PO SOLN: 5.0000 mg | Freq: Once | ORAL | Status: DC | PRN

## 2014-08-20 MED ORDER — LIDOCAINE HCL (CARDIAC) 20 MG/ML IV SOLN
INTRAVENOUS | Status: DC | PRN
  Administered 2014-08-20: 80 mg via INTRAVENOUS

## 2014-08-20 MED ORDER — ONDANSETRON HCL 4 MG/2ML IJ SOLN
INTRAMUSCULAR | Status: DC | PRN
  Administered 2014-08-20: 4 mg via INTRAVENOUS

## 2014-08-20 SURGICAL SUPPLY — 40 items
BANDAGE ELASTIC 6 VELCRO ST LF (GAUZE/BANDAGES/DRESSINGS) ×3 IMPLANT
BLADE CUDA 5.5 (BLADE) IMPLANT
BLADE CUDA GRT WHITE 3.5 (BLADE) IMPLANT
BLADE CUTTER GATOR 3.5 (BLADE) ×3 IMPLANT
BLADE CUTTER MENIS 5.5 (BLADE) IMPLANT
BLADE GREAT WHITE 4.2 (BLADE) ×2 IMPLANT
BLADE GREAT WHITE 4.2MM (BLADE) ×1
BUR OVAL 4.0 (BURR) IMPLANT
CANISTER SUCT 3000ML (MISCELLANEOUS) IMPLANT
CUTTER MENISCUS  4.2MM (BLADE)
CUTTER MENISCUS 4.2MM (BLADE) IMPLANT
DRAPE ARTHROSCOPY W/POUCH 90 (DRAPES) ×3 IMPLANT
DURAPREP 26ML APPLICATOR (WOUND CARE) ×3 IMPLANT
ELECT MENISCUS 165MM 90D (ELECTRODE) IMPLANT
ELECT REM PT RETURN 9FT ADLT (ELECTROSURGICAL) ×3
ELECTRODE REM PT RTRN 9FT ADLT (ELECTROSURGICAL) IMPLANT
GAUZE SPONGE 4X4 12PLY STRL (GAUZE/BANDAGES/DRESSINGS) ×4 IMPLANT
GAUZE XEROFORM 1X8 LF (GAUZE/BANDAGES/DRESSINGS) ×3 IMPLANT
GLOVE BIOGEL PI IND STRL 7.0 (GLOVE) ×1 IMPLANT
GLOVE BIOGEL PI INDICATOR 7.0 (GLOVE) ×4
GLOVE ECLIPSE 6.5 STRL STRAW (GLOVE) ×5 IMPLANT
GLOVE EXAM NITRILE LRG STRL (GLOVE) ×2 IMPLANT
GLOVE ORTHO TXT STRL SZ7.5 (GLOVE) ×3 IMPLANT
GOWN STRL REUS W/ TWL LRG LVL3 (GOWN DISPOSABLE) ×2 IMPLANT
GOWN STRL REUS W/ TWL XL LVL3 (GOWN DISPOSABLE) ×1 IMPLANT
GOWN STRL REUS W/TWL LRG LVL3 (GOWN DISPOSABLE) ×6
GOWN STRL REUS W/TWL XL LVL3 (GOWN DISPOSABLE) ×3
HOLDER KNEE FOAM BLUE (MISCELLANEOUS) ×3 IMPLANT
IV NS IRRIG 3000ML ARTHROMATIC (IV SOLUTION) ×6 IMPLANT
KNEE WRAP E Z 3 GEL PACK (MISCELLANEOUS) ×3 IMPLANT
MANIFOLD NEPTUNE II (INSTRUMENTS) ×3 IMPLANT
PACK ARTHROSCOPY DSU (CUSTOM PROCEDURE TRAY) ×3 IMPLANT
PACK BASIN DAY SURGERY FS (CUSTOM PROCEDURE TRAY) ×3 IMPLANT
PENCIL BUTTON HOLSTER BLD 10FT (ELECTRODE) IMPLANT
SET ARTHROSCOPY TUBING (MISCELLANEOUS) ×3
SET ARTHROSCOPY TUBING LN (MISCELLANEOUS) ×1 IMPLANT
SUT ETHILON 3 0 PS 1 (SUTURE) ×3 IMPLANT
SUT VIC AB 3-0 FS2 27 (SUTURE) IMPLANT
TOWEL OR 17X24 6PK STRL BLUE (TOWEL DISPOSABLE) ×3 IMPLANT
WATER STERILE IRR 1000ML POUR (IV SOLUTION) ×3 IMPLANT

## 2014-08-20 NOTE — Transfer of Care (Signed)
Immediate Anesthesia Transfer of Care Note  Patient: Bruce Gray  Procedure(s) Performed: Procedure(s): LEFT KNEE ARTHROSCOPY WITH DEBRIDEMENT/SHAVING (CHONDROPLASTY), MEDIAL MENISECTOMY, EXCISION OF PLICA (Left)  Patient Location: PACU  Anesthesia Type:General  Level of Consciousness: awake and sedated  Airway & Oxygen Therapy: Patient Spontanous Breathing and Patient connected to face mask oxygen  Post-op Assessment: Report given to PACU RN, Post -op Vital signs reviewed and stable and Patient moving all extremities  Post vital signs: Reviewed and stable  Complications: No apparent anesthesia complications

## 2014-08-20 NOTE — Anesthesia Preprocedure Evaluation (Signed)
Anesthesia Evaluation  Patient identified by MRN, date of birth, ID band Patient awake    Reviewed: Allergy & Precautions, H&P , NPO status , Patient's Chart, lab work & pertinent test results  Airway Mallampati: I TM Distance: >3 FB Neck ROM: Full    Dental   Pulmonary former smoker,          Cardiovascular hypertension, Pt. on medications + CAD and + CABG     Neuro/Psych    GI/Hepatic   Endo/Other  diabetes, Well Controlled, Type 2, Oral Hypoglycemic Agents  Renal/GU      Musculoskeletal   Abdominal   Peds  Hematology   Anesthesia Other Findings   Reproductive/Obstetrics                           Anesthesia Physical Anesthesia Plan  ASA: III  Anesthesia Plan: General   Post-op Pain Management:    Induction: Intravenous  Airway Management Planned: LMA  Additional Equipment:   Intra-op Plan:   Post-operative Plan: Extubation in OR  Informed Consent: I have reviewed the patients History and Physical, chart, labs and discussed the procedure including the risks, benefits and alternatives for the proposed anesthesia with the patient or authorized representative who has indicated his/her understanding and acceptance.     Plan Discussed with: CRNA and Surgeon  Anesthesia Plan Comments:         Anesthesia Quick Evaluation

## 2014-08-20 NOTE — Interval H&P Note (Signed)
History and Physical Interval Note:  08/20/2014 7:09 AM  Bruce Gray  has presented today for surgery, with the diagnosis of left knee chondromalcia - patella, tear menicus medial knee  The various methods of treatment have been discussed with the patient and family. After consideration of risks, benefits and other options for treatment, the patient has consented to  Procedure(s): LEFT KNEE ARTHROSCOPY WITH DEBRIDEMENT/SHAVING (CHONDROPLASTY), MEDIAL MENISECTOMY (Left) as a surgical intervention .  The patient's history has been reviewed, patient examined, no change in status, stable for surgery.  I have reviewed the patient's chart and labs.  Questions were answered to the patient's satisfaction.     Kimara Bencomo F

## 2014-08-20 NOTE — Discharge Instructions (Signed)
Arthroscopic Procedure, Knee, Care After Refer to this sheet in the next few weeks. These discharge instructions provide you with general information on caring for yourself after you leave the hospital. Your health care provider may also give you specific instructions. Your treatment has been planned according to the most current medical practices available, but unavoidable complications sometimes occur. If you have any problems or questions after discharge, please call your health care provider. HOME CARE INSTRUCTIONS   Weight bearing as tolerated.  Change dressing in 3 days and apply band-aids.  May shower but do not soak incisions.  May apply ice for up to 20 minutes at a time for pain and swelling.  Follow up appointment in one week.   It is normal to be sore for a couple days after surgery. See your health care provider if this seems to be getting worse rather than better.  Only take over-the-counter or prescription medicines for pain, discomfort, or fever as directed by your health care provider.  Take showers rather than baths, or as directed by your health care provider.  Change bandages (dressings) if necessary or as directed.  You may resume normal diet and activities as directed or allowed.  Avoid lifting and driving until you are directed otherwise.  Make an appointment to see your health care provider for stitches (suture) or staple removal as directed.  You may put ice on the area.  Put the ice in a plastic bag. Place a towel between your skin and the bag.  Leave the ice on for 15-20 minutes, three to four times per day for the first 2 days.  Elevate the knee above the level of your heart to reduce swelling, and avoid dangling the leg.  Do 10-15 ankle pumps (pointing your toes toward you and then away from you) two to three times daily.  If you are given compression stockings to wear after surgery, use them for as long as your surgeon tells you (around 10-14  days).  Avoid smoking and exposure to secondhand smoke. SEEK MEDICAL CARE IF:   You have increased bleeding from your wounds.  You see redness or swelling or you have increasing pain in your wounds.  You have pus coming from your wound.  You have a fever or persistent symptoms for more than 2-3 days.  You notice a bad smell coming from the wound or dressing.  You have severe pain with any motion of your knee. SEEK IMMEDIATE MEDICAL CARE IF:   You develop a rash.  You have difficulty breathing.  You develop any reaction or side effects to medicines taken.  You develop pain in the calves or back of the knee.  You develop chest pain, shortness of breath, or difficulty breathing.  You develop numbness or tingling in the leg or foot. MAKE SURE YOU:   Understand these instructions.  Will watch your condition.  Will get help right away if you are not doing well or you get worse. Document Released: 06/15/2005 Document Revised: 12/01/2013 Document Reviewed: 04/23/2013 [facility] Patient Information 2015 [place], [place]. This information is not intended to replace advice given to you by your health care provider. Make sure you discuss any questions you have with your health care provider.  Post Anesthesia Home Care Instructions  Activity: Get plenty of rest for the remainder of the day. A responsible adult should stay with you for 24 hours following the procedure.  For the next 24 hours, DO NOT: -Drive a car -Paediatric nurse -Drink alcoholic  beverages -Take any medication unless instructed by your physician -Make any legal decisions or sign important papers.  Meals: Start with liquid foods such as gelatin or soup. Progress to regular foods as tolerated. Avoid greasy, spicy, heavy foods. If nausea and/or vomiting occur, drink only clear liquids until the nausea and/or vomiting subsides. Call your physician if vomiting continues.  Special Instructions/Symptoms: Your throat  may feel dry or sore from the anesthesia or the breathing tube placed in your throat during surgery. If this causes discomfort, gargle with warm salt water. The discomfort should disappear within 24 hours.

## 2014-08-20 NOTE — Anesthesia Postprocedure Evaluation (Signed)
  Anesthesia Post-op Note  Patient: Bruce Gray  Procedure(s) Performed: Procedure(s): LEFT KNEE ARTHROSCOPY WITH DEBRIDEMENT/SHAVING (CHONDROPLASTY), MEDIAL MENISECTOMY, EXCISION OF PLICA (Left)  Patient Location: PACU  Anesthesia Type:General  Level of Consciousness: awake and alert   Airway and Oxygen Therapy: Patient Spontanous Breathing  Post-op Pain: none  Post-op Assessment: Post-op Vital signs reviewed, Patient's Cardiovascular Status Stable and Respiratory Function Stable  Post-op Vital Signs: Reviewed  Filed Vitals:   08/20/14 0845  BP: 120/62  Pulse: 78  Temp: 36.4 C  Resp: 15    Complications: No apparent anesthesia complications

## 2014-08-20 NOTE — Anesthesia Procedure Notes (Addendum)
Procedure Name: LMA Insertion Date/Time: 08/20/2014 7:26 AM Performed by: Baxter Flattery Pre-anesthesia Checklist: Patient identified, Emergency Drugs available, Suction available and Patient being monitored Patient Re-evaluated:Patient Re-evaluated prior to inductionOxygen Delivery Method: Circle System Utilized Preoxygenation: Pre-oxygenation with 100% oxygen Intubation Type: IV induction Ventilation: Mask ventilation without difficulty LMA: LMA inserted LMA Size: 4.0 Number of attempts: 1 Airway Equipment and Method: bite block Placement Confirmation: positive ETCO2 and breath sounds checked- equal and bilateral Tube secured with: Tape Dental Injury: Teeth and Oropharynx as per pre-operative assessment

## 2014-08-23 ENCOUNTER — Encounter (HOSPITAL_BASED_OUTPATIENT_CLINIC_OR_DEPARTMENT_OTHER): Payer: Self-pay | Admitting: Orthopedic Surgery

## 2014-08-23 NOTE — Op Note (Signed)
NAMETALHA, ISER NO.:  [id number]  MEDICAL RECORD NO.:  [id number]  LOCATION:                               FACILITY:  [facility]  PHYSICIAN:  Ninetta Lights, M.D. 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  POSTOPERATIVE DIAGNOSES: 1. Left knee tricompartmental arthritis with marked displaced medial     meniscus tear with grade 2 and 3 changes of the patella. 2. Grade 3 changes of the medial compartment. 3. Extensive degenerative complex tearing of the medial meniscus with     underlying chondrocalcinosis.  PROCEDURES: 1. Left knee exam under anesthesia and arthroscopy. 2. Partial medial meniscectomy. 3. Tricompartmental chondroplasty. 4. Excision of a relatively large fibrous medial plica.  SURGEON:  Ninetta Lights, M.D.  ASSISTANT:  Doran Stabler, P.A.  ANESTHESIA:  General.  BLOOD LOSS:  Minimal.  SPECIMENS:  None.  CULTURES:  None.  COMPLICATIONS:  None.  DRESSINGS:  Soft compressive.  TOURNIQUET:  Not employed.  PROCEDURE IN DETAIL:  The patient was brought to operating room, placed on the operating table in supine position.  After adequate anesthesia had been obtained, a leg holder was applied.  Leg was prepped and draped in usual sterile fashion.  Two portals, one each in medial and lateral parapatellar.  Arthroscope was introduced.  Knee was distended and inspected.  Two patellar tracking.  Some grade 2 and 3 changes, peak of the patella debrided.  Relatively large fibrotic medial plica excised. ACL intact.  Lateral compartment had a little bit of chondral calcinosis, no overt tears, minimal degenerative change.  Medial compartment, extensive complex tearing, posterior middle third medial meniscus.  Taken out to a stable rim,  tapered into remaining meniscus. Underlying chondrocalcinosis.  Diffuse grade 3 change in the compartment debrided.  The entire knee examined.  No other findings were appreciated.  Instruments were removed.  Portals were closed with nylon. Knee injected with Depo-Medrol Marcaine.  Anesthesia was reversed. Brought to the recovery room.  Tolerated the surgery well.  No complications.     Ninetta Lights, M.D.     DFM/MEDQ  D:  08/20/2014  T:  08/21/2014  Job:  [id number]

## 2014-12-29 ENCOUNTER — Other Ambulatory Visit: Payer: Self-pay | Admitting: Cardiovascular Disease

## 2015-01-03 ENCOUNTER — Ambulatory Visit: Payer: Medicare Other | Admitting: Cardiovascular Disease

## 2015-01-04 ENCOUNTER — Encounter: Payer: Self-pay | Admitting: Cardiovascular Disease

## 2015-01-04 NOTE — Telephone Encounter (Signed)
Rx(s) sent to pharmacy electronically.  

## 2015-02-22 DIAGNOSIS — K644 Residual hemorrhoidal skin tags: Secondary | ICD-10-CM | POA: Diagnosis not present

## 2015-02-22 DIAGNOSIS — Z1211 Encounter for screening for malignant neoplasm of colon: Secondary | ICD-10-CM | POA: Diagnosis not present

## 2015-02-22 DIAGNOSIS — K5730 Diverticulosis of large intestine without perforation or abscess without bleeding: Secondary | ICD-10-CM | POA: Diagnosis not present

## 2015-03-01 DIAGNOSIS — H5210 Myopia, unspecified eye: Secondary | ICD-10-CM | POA: Diagnosis not present

## 2015-03-08 DIAGNOSIS — M25562 Pain in left knee: Secondary | ICD-10-CM | POA: Diagnosis not present

## 2015-03-11 ENCOUNTER — Ambulatory Visit (INDEPENDENT_AMBULATORY_CARE_PROVIDER_SITE_OTHER): Payer: Commercial Managed Care - HMO | Admitting: Cardiovascular Disease

## 2015-03-11 VITALS — BP 156/90 | HR 80 | Ht 65.0 in | Wt 170.9 lb

## 2015-03-11 DIAGNOSIS — I10 Essential (primary) hypertension: Secondary | ICD-10-CM

## 2015-03-11 DIAGNOSIS — I2510 Atherosclerotic heart disease of native coronary artery without angina pectoris: Secondary | ICD-10-CM

## 2015-03-11 DIAGNOSIS — E118 Type 2 diabetes mellitus with unspecified complications: Secondary | ICD-10-CM

## 2015-03-11 NOTE — Patient Instructions (Signed)
Your physician wants you to follow-up in: 1 year or sooner if needed. You will receive a reminder letter in the mail two months in advance. If you don't receive a letter, please call our office to schedule the follow-up appointment.  Your physician has requested that you have a lexiscan myoview prior to your next visit in 1 year.

## 2015-03-12 ENCOUNTER — Encounter: Payer: Self-pay | Admitting: Cardiovascular Disease

## 2015-03-12 NOTE — Progress Notes (Signed)
Patient ID: Bruce [name], male   DOB: 22-Oct-1941, 73 y.o.   MRN: [id number]     Primary M.D.: Dr. Harlene [name]  HPI: Bruce [name] is a 74 y.o. male who is a former patient of Dr. Rex [name].  He presents for one-year follow-up cardiology evaluation.  Bruce [name] has known CAD and in 1996 underwent CABG revascularization surgery in 1986 by Dr. Glade [name].  He has a  history of prostate CA treated with seed implantation, type 2 diabetes mellitus, mild renal insufficiency and also has a history of complex sleep apnea for which he has refused to be treated. There also is a history of gout.  An echo Doppler study in January 2014 showed normal systolic function with ejection fraction of 55-60% with grade 1 diastolic dysfunction. There is mild primary hypertension with estimated PA pressure 34 mm. He had mild atrial dilatation.  His last nuclear perfusion study in January 2014 showed normal perfusion and function with apical thinning.  Since I last saw him in January 2015, he denies any recent chest pain.,  He has been taking amlodipine 5 mg and carvedilol 6.250 g twice a day in addition to Lasix 20 mg and isosorbide mononitrate 15 mg for his blood pressure and CAD.  He is unaware of any palpitations.  He denies presyncope or syncope.  He has been on combination therapy for mixed hyperlipidemia with atorvastatin 40 mg in addition to niacin 500 mg.  His GERD has been fairly well controlled with omeprazole 20 mg daily.  As been on aspirin.  There also is a history of gout for which he takesallupurinol 100 mg.  He is diabetic on metformin 500 mg daily.  He denies daytime fatigue.  He does snore. He never pursued CPAP/BIPAP therapy for his sleep apnea.  Past Medical History  Diagnosis Date  . Prostate cancer     seed implantation  . Diabetes mellitus     type 2  . Mild renal insufficiency   . CAD (coronary artery disease)   . Gout   . HOH (hard of hearing)   . Complex sleep apnea syndrome     does  not use a cpap-cannot  . Heart disease   . Liver disease   . Hypertension   . High cholesterol     Past Surgical History  Procedure Laterality Date  . Coronary artery bypass graft  1986  . Cholecystectomy  2011  . Tonsillectomy    . Hemorrhoid surgery  2007  . Colonoscopy    . Insertion prostate radiation seed  2013  . Cardiac catheterization  86,2010  . Knee arthroscopy with medial menisectomy Left 08/20/2014    Procedure: LEFT KNEE ARTHROSCOPY WITH DEBRIDEMENT/SHAVING (CHONDROPLASTY), MEDIAL MENISECTOMY, EXCISION OF PLICA;  Surgeon: Ninetta Lights, MD;  Location: [facility];  Service: Orthopedics;  Laterality: Left;  . Doppler echocardiography      mild concentric LVH with mild grade 1 diastolic dysfunction with normal tissue doppler parameters  . Cardiovascular stress test  2008    Allergies  Allergen Reactions  . Codeine Nausea And Vomiting  . Prevacid [Lansoprazole] Nausea And Vomiting    Current Outpatient Prescriptions  Medication Sig Dispense Refill  . allopurinol (ZYLOPRIM) 100 MG tablet Take 200 mg by mouth daily.     Marland Kitchen amLODipine (NORVASC) 5 MG tablet Take 1 tablet (5 mg total) by mouth daily. <PLEASE SCHEDULE APPOINTMENT FOR REFILLS> 90 tablet 0  . aspirin 81 MG tablet Take 81 mg by  mouth daily.    Marland Kitchen atorvastatin (LIPITOR) 40 MG tablet Take 1 tablet (40 mg total) by mouth daily. <PLEASE SCHEDULE APPOINTMENT FOR REFILLS> 90 tablet 0  . carvedilol (COREG) 6.25 MG tablet Take 1 tablet (6.25 mg total) by mouth 2 (two) times daily. <PLEASE SCHEDULE APPOINTMENT FOR REFILLS> 180 tablet 0  . docusate sodium (COLACE) 100 MG capsule Take 100 mg by mouth daily.    . furosemide (LASIX) 20 MG tablet Take 20 mg by mouth daily.    . isosorbide mononitrate (IMDUR) 30 MG 24 hr tablet Take 0.5 tablets (15 mg total) by mouth daily. <PLEASE SCHEDULE APPOINTMENT FOR REFILLS> 45 tablet 0  . metFORMIN (GLUCOPHAGE-XR) 500 MG 24 hr tablet Take 500 mg by mouth daily.    .  niacin 500 MG tablet Take 500 mg by mouth daily with breakfast.    . omeprazole (PRILOSEC) 20 MG capsule Take 1 capsule (20 mg total) by mouth daily. <PLEASE SCHEDULE APPOINTMENT FOR REFILLS> 90 capsule 3  . oxyCODONE (ROXICODONE) 5 MG immediate release tablet Take 1 tablet (5 mg total) by mouth every 4 (four) hours as needed for severe pain. 60 tablet 0  . tamsulosin (FLOMAX) 0.4 MG CAPS Take 1 capsule by mouth daily after supper.      No current facility-administered medications for this visit.    History   Social History  . Marital Status: Married    Spouse Name: N/A  . Number of Children: 2  . Years of Education: 12   Occupational History  . retired    Social History Main Topics  . Smoking status: Former Smoker    Quit date: 07/03/1958  . Smokeless tobacco: Never Used  . Alcohol Use: No     Comment: occasional  . Drug Use: No  . Sexual Activity: Not on file   Other Topics Concern  . Not on file   Social History Narrative    Family History  Problem Relation Age of Onset  . Cancer - Other Mother     breast  . Stroke Father    Bruce General: Negative; No fevers, chills, or night sweats;  HEENT: Positive for cataracts; No changes  hearing, sinus congestion, difficulty swallowing Pulmonary: Negative; No cough, wheezing, shortness of breath, hemoptysis Cardiovascular: See history of present illness GI: Negative; No nausea, vomiting, diarrhea, or abdominal pain GU: Negative; No dysuria, hematuria, or difficulty voiding Musculoskeletal: Had left knee surgery by Dr. Percell Miller.  History of left hip bursitis. Hematologic/Oncology: Negative; no easy bruising, bleeding Endocrine: Negative; no heat/cold intolerance; no diabetes Neuro: Negative; no changes in balance, headaches Skin: Negative; No rashes or skin lesions Psychiatric: Negative; No behavioral problems, depression Sleep: History of complex sleep apnea; currently not on therapy snoring, daytime sleepiness,  hypersomnolence, bruxism, restless legs, hypnogognic hallucinations, no cataplexy Other comprehensive 14 point system review is negative.   PE BP 156/90 mmHg  Pulse 80  Ht '5\' 5"'  (1.651 m)  Wt 170 lb 14.4 oz (77.52 kg)  BMI 28.44 kg/m2  Repeat blood pressure by me 140/82 General: Alert, oriented, no distress.  Skin: normal turgor, no rashes HEENT: Normocephalic, atraumatic. Pupils round and reactive; sclera anicteric;no lid lag. Extraocular muscles intact. Nose without nasal septal hypertrophy Mouth/Parynx benign; Mallinpatti scale 3 Neck: No JVD, no carotid bruits; normal carotid upstroke Lungs: clear to ausculatation and percussion; no wheezing or rales Chest wall: no tenderness to palpitation Heart: RRR, s1 s2 normal 1/6 systolic murmur along the left sternal border. No gallop or rubs. Abdomen:  soft, nontender; no hepatosplenomehaly, BS+; abdominal aorta nontender and not dilated by palpation. Back: no CVA tenderness Pulses 2+ Extremities: no clubbing cyanosis or edema, Homan's sign negative  Neurologic: grossly nonfocal; cranial nerves grossly normal. Psychologic: normal affect and mood.  ECG (independently read by me): Normal sinus rhythm at 80 bpm.  Normal intervals.  No significant ST segment changes  January 2015 ECG (independently read by me): Sinus rhythm 87 beats per minute. Complete left bundle-branch block. Nonspecific T changes.  LABS:  BMET  BMP Latest Ref Rng 08/17/2014 01/03/2010  Glucose 70 - 99 mg/dL 119(H) 97  BUN 6 - 23 mg/dL 18 24(H)  Creatinine 0.50 - 1.35 mg/dL 1.16 1.44  Sodium 137 - 147 mEq/L 141 140  Potassium 3.7 - 5.3 mEq/L 4.0 3.8  Chloride 96 - 112 mEq/L 104 106  CO2 19 - 32 mEq/L 23 24  Calcium 8.4 - 10.5 mg/dL 9.3 9.5     Hepatic Function Panel   Hepatic Function Latest Ref Rng 01/03/2010  Total Protein 6.0 - 8.3 g/dL 6.9  Albumin 3.5 - 5.2 g/dL 3.9  AST 0 - 37 U/L 18  ALT 0 - 53 U/L 15  Alk Phosphatase 39 - 117 U/L 85  Total  Bilirubin 0.3 - 1.2 mg/dL 0.5     CBC No results found for: WBC   BNP No results found for: PROBNP  Lipid Panel  No results found for: CHOL   RADIOLOGY: No results found.    ASSESSMENT AND PLAN: Bruce [name] is a 74 years old gentleman who is 30 years status post CABG surgery done by Dr. Glade [name] in 1986. His last cardiac catheterization was done by Dr. Rex [name] in July 2010 which showed dense calcification of his native coronary arteries. All native vessels were occluded proximally. There was a patent  sequential graft to the diagonal and OM with a proximal tapering of 40%. The sequential graft to the AM and PDA was large caliber and widely patent. His LIMA graft to the LAD was small and patent. He continues to be chest pain-free on his current medical regimen. He does have hyperlipidemia and has been taking niacin in addition to atorvastatin 40 mg. his blood pressure today was borderline to mildly elevated on his current therapy.  I again discussed his untreated sleep apnea and associated cardiovascular comorbidities.  He is contemplating possible following up with a reevaluation for this, but at present, he did not wish for me to schedule him for a follow-up evaluation.  He will contact me regarding his decision.  He sees Dr. Harlene [name], for primary care.  He tells me she will obese, obtaining a complete set of laboratory.  Target LDL is less than 70 in this patient with CAD.  I have recommended that next year he undergo a [place] study for further evaluation of his CAD and graft patency.  I will see him sooner if problems arise.  Otherwise, I will see him following his nuclear perfusion study.    Troy Sine, MD, [facility]  03/12/2015 1:49 PM

## 2015-03-15 DIAGNOSIS — E1122 Type 2 diabetes mellitus with diabetic chronic kidney disease: Secondary | ICD-10-CM | POA: Diagnosis not present

## 2015-03-15 DIAGNOSIS — I10 Essential (primary) hypertension: Secondary | ICD-10-CM | POA: Diagnosis not present

## 2015-03-15 DIAGNOSIS — E780 Pure hypercholesterolemia: Secondary | ICD-10-CM | POA: Diagnosis not present

## 2015-03-31 ENCOUNTER — Telehealth: Payer: Self-pay | Admitting: Cardiovascular Disease

## 2015-03-31 MED ORDER — ATORVASTATIN CALCIUM 40 MG PO TABS: 40.0000 mg | ORAL_TABLET | Freq: Every day | ORAL | Status: DC

## 2015-03-31 MED ORDER — CARVEDILOL 6.25 MG PO TABS: 6.2500 mg | ORAL_TABLET | Freq: Two times a day (BID) | ORAL | Status: DC

## 2015-03-31 MED ORDER — ISOSORBIDE MONONITRATE ER 30 MG PO TB24: 15.0000 mg | ORAL_TABLET | Freq: Every day | ORAL | Status: DC

## 2015-03-31 MED ORDER — OMEPRAZOLE 20 MG PO CPDR: 20.0000 mg | DELAYED_RELEASE_CAPSULE | Freq: Every day | ORAL | Status: DC

## 2015-03-31 MED ORDER — AMLODIPINE BESYLATE 5 MG PO TABS: 5.0000 mg | ORAL_TABLET | Freq: Every day | ORAL | Status: DC

## 2015-03-31 NOTE — Telephone Encounter (Signed)
°  1. Which medications need to be refilled? Amlodipine,Atorvastatin,Carvedilol,Isosorbide andOmetrazole  2. Which pharmacy is medication to be sent to?[facility]  3. Do they need a 30 day or 90 day supply? 90 and refills  4. Would they like a call back once the medication has been sent to the pharmacy? no

## 2015-03-31 NOTE — Telephone Encounter (Signed)
Rx(s) sent to pharmacy electronically.  

## 2015-05-18 DIAGNOSIS — N183 Chronic kidney disease, stage 3 (moderate): Secondary | ICD-10-CM | POA: Diagnosis not present

## 2015-05-18 DIAGNOSIS — I10 Essential (primary) hypertension: Secondary | ICD-10-CM | POA: Diagnosis not present

## 2015-05-18 DIAGNOSIS — C61 Malignant neoplasm of prostate: Secondary | ICD-10-CM | POA: Diagnosis not present

## 2015-05-25 DIAGNOSIS — C61 Malignant neoplasm of prostate: Secondary | ICD-10-CM | POA: Diagnosis not present

## 2015-05-25 DIAGNOSIS — R3916 Straining to void: Secondary | ICD-10-CM | POA: Diagnosis not present

## 2015-05-25 DIAGNOSIS — N401 Enlarged prostate with lower urinary tract symptoms: Secondary | ICD-10-CM | POA: Diagnosis not present

## 2015-06-06 ENCOUNTER — Other Ambulatory Visit: Payer: Self-pay

## 2015-06-24 DIAGNOSIS — M25562 Pain in left knee: Secondary | ICD-10-CM | POA: Diagnosis not present

## 2015-09-16 DIAGNOSIS — I10 Essential (primary) hypertension: Secondary | ICD-10-CM | POA: Diagnosis not present

## 2015-09-16 DIAGNOSIS — N189 Chronic kidney disease, unspecified: Secondary | ICD-10-CM | POA: Diagnosis not present

## 2015-09-16 DIAGNOSIS — E785 Hyperlipidemia, unspecified: Secondary | ICD-10-CM | POA: Diagnosis not present

## 2015-09-16 DIAGNOSIS — E119 Type 2 diabetes mellitus without complications: Secondary | ICD-10-CM | POA: Diagnosis not present

## 2015-09-20 DIAGNOSIS — E119 Type 2 diabetes mellitus without complications: Secondary | ICD-10-CM | POA: Diagnosis not present

## 2015-09-20 DIAGNOSIS — E785 Hyperlipidemia, unspecified: Secondary | ICD-10-CM | POA: Diagnosis not present

## 2015-09-20 DIAGNOSIS — I10 Essential (primary) hypertension: Secondary | ICD-10-CM | POA: Diagnosis not present

## 2015-12-07 DIAGNOSIS — C61 Malignant neoplasm of prostate: Secondary | ICD-10-CM | POA: Diagnosis not present

## 2015-12-14 DIAGNOSIS — R3916 Straining to void: Secondary | ICD-10-CM | POA: Diagnosis not present

## 2015-12-14 DIAGNOSIS — N401 Enlarged prostate with lower urinary tract symptoms: Secondary | ICD-10-CM | POA: Diagnosis not present

## 2015-12-14 DIAGNOSIS — C61 Malignant neoplasm of prostate: Secondary | ICD-10-CM | POA: Diagnosis not present

## 2015-12-14 DIAGNOSIS — Z0000 Encounter for general adult medical examination without abnormal findings: Secondary | ICD-10-CM | POA: Diagnosis not present

## 2016-01-17 DIAGNOSIS — R0782 Intercostal pain: Secondary | ICD-10-CM | POA: Diagnosis not present

## 2016-02-20 DIAGNOSIS — J209 Acute bronchitis, unspecified: Secondary | ICD-10-CM | POA: Diagnosis not present

## 2016-02-29 DIAGNOSIS — H35361 Drusen (degenerative) of macula, right eye: Secondary | ICD-10-CM | POA: Diagnosis not present

## 2016-02-29 DIAGNOSIS — H5210 Myopia, unspecified eye: Secondary | ICD-10-CM | POA: Diagnosis not present

## 2016-04-03 ENCOUNTER — Other Ambulatory Visit: Payer: Self-pay | Admitting: Cardiovascular Disease

## 2016-04-04 ENCOUNTER — Other Ambulatory Visit: Payer: Self-pay | Admitting: Cardiovascular Disease

## 2016-04-04 NOTE — Telephone Encounter (Signed)
Rx(s) sent to pharmacy electronically.  

## 2016-04-27 ENCOUNTER — Ambulatory Visit (INDEPENDENT_AMBULATORY_CARE_PROVIDER_SITE_OTHER): Payer: Commercial Managed Care - HMO | Admitting: Cardiovascular Disease

## 2016-04-27 ENCOUNTER — Encounter: Payer: Self-pay | Admitting: Cardiovascular Disease

## 2016-04-27 VITALS — BP 134/73 | HR 68 | Ht 67.0 in | Wt 168.4 lb

## 2016-04-27 DIAGNOSIS — I2510 Atherosclerotic heart disease of native coronary artery without angina pectoris: Secondary | ICD-10-CM | POA: Diagnosis not present

## 2016-04-27 DIAGNOSIS — E785 Hyperlipidemia, unspecified: Secondary | ICD-10-CM

## 2016-04-27 DIAGNOSIS — G4733 Obstructive sleep apnea (adult) (pediatric): Secondary | ICD-10-CM | POA: Diagnosis not present

## 2016-04-27 NOTE — Progress Notes (Signed)
Patient ID: Bruce Gray, male   DOB: [birth year], 75 y.o.   MRN: [id number]     Primary M.D.: Dr. Harlene Ramus  HPI: Bruce Gray is a 75 y.o. male who is a former patient of Dr. Rex Kras who  presents for one-year follow-up cardiology evaluation.  Bruce Gray has known CAD and underwent CABG revascularization surgery in 1986 by Dr. Glade Nurse.  He has a  history of prostate CA treated with seed implantation, type 2 diabetes mellitus, mild renal insufficiency and also has a history of complex sleep apnea for which he has refused to be treated. There also is a history of gout.  An echo Doppler study in January 2014 showed normal systolic function with ejection fraction of 55-60% with grade 1 diastolic dysfunction. There is mild primary hypertension with estimated PA pressure 34 mm. He had mild atrial dilatation.  His last nuclear perfusion study in January 2014 showed normal perfusion and function with apical thinning.  Since I last saw him in April 2016, he denies any recent chest pain.,  He has been taking amlodipine 5 mg and carvedilol 6.250 g twice a day in addition to Lasix 20 mg and isosorbide mononitrate 15 mg for his blood pressure and CAD.  He is unaware of any palpitations.  He denies presyncope or syncope.  He has been on combination therapy for mixed hyperlipidemia with atorvastatin 40 mg in addition to niacin 500 mg.  His GERD has been fairly well controlled with omeprazole 20 mg daily.  As been on aspirin.  There also is a history of gout for which he takesallupurinol 100 mg.  He is diabetic on metformin 500 mg daily.  He denies daytime fatigue.  He does snore. He never pursued CPAP/BIPAP therapy for his sleep apnea.   He tells me that he had lab work done by Dr. Zadie Rhine and equal.  Was told that his cholesterol was 137, and triglycerides were 144.  He did not know the specifics of sub-fractionation.  He will also have follow-up laboratory by her this fall.  He presents for evaluation  today.  Past Medical History  Diagnosis Date  . Prostate cancer ([place])     seed implantation  . Diabetes mellitus ([place])     type 2  . Mild renal insufficiency   . CAD (coronary artery disease)   . Gout   . HOH (hard of hearing)   . Complex sleep apnea syndrome     does not use a cpap-cannot  . Heart disease   . Liver disease   . Hypertension   . High cholesterol     Past Surgical History  Procedure Laterality Date  . Coronary artery bypass graft  1986  . Cholecystectomy  2011  . Tonsillectomy    . Hemorrhoid surgery  2007  . Colonoscopy    . Insertion prostate radiation seed  2013  . Cardiac catheterization  86,2010  . Knee arthroscopy with medial menisectomy Left 08/20/2014    Procedure: LEFT KNEE ARTHROSCOPY WITH DEBRIDEMENT/SHAVING (CHONDROPLASTY), MEDIAL MENISECTOMY, EXCISION OF PLICA;  Surgeon: Ninetta Lights, MD;  Location: [facility];  Service: Orthopedics;  Laterality: Left;  . Doppler echocardiography      mild concentric LVH with mild grade 1 diastolic dysfunction with normal tissue doppler parameters  . Cardiovascular stress test  2008    Allergies  Allergen Reactions  . Codeine Nausea And Vomiting  . Prevacid [Lansoprazole] Nausea And Vomiting    Current Outpatient Prescriptions  Medication Sig Dispense Refill  . allopurinol (ZYLOPRIM) 100 MG tablet Take 200 mg by mouth daily.     Marland Kitchen amLODipine (NORVASC) 5 MG tablet Take 1 tablet (5 mg total) by mouth daily. MUST KEEP APPOINTMENT 04/27/16 WITH DR Claiborne Billings FOR FUTURE REFILLS 90 tablet 0  . aspirin 81 MG tablet Take 81 mg by mouth daily.    Marland Kitchen atorvastatin (LIPITOR) 40 MG tablet Take 1 tablet (40 mg total) by mouth daily. MUST KEEP APPOINTMENT 04/27/16 WITH DR Claiborne Billings FOR FUTURE REFILLS 90 tablet 0  . carvedilol (COREG) 6.25 MG tablet Take 1 tablet (6.25 mg total) by mouth 2 (two) times daily with a meal. MUST KEEP APPOINTMENT 04/27/16 WITH DR Claiborne Billings FOR FUTURE REFILLS 180 tablet 0  . docusate sodium  (COLACE) 100 MG capsule Take 100 mg by mouth daily.    . furosemide (LASIX) 20 MG tablet Take 20 mg by mouth daily.    . isosorbide mononitrate (IMDUR) 30 MG 24 hr tablet TAKE 1/2 TABLET EVERY DAY 45 tablet 0  . metFORMIN (GLUCOPHAGE-XR) 500 MG 24 hr tablet Take 500 mg by mouth daily.    . niacin 500 MG tablet Take 500 mg by mouth daily with breakfast.    . omeprazole (PRILOSEC) 20 MG capsule Take 1 capsule (20 mg total) by mouth daily. 90 capsule 3   No current facility-administered medications for this visit.    Social History   Social History  . Marital Status: Married    Spouse Name: N/A  . Number of Children: 2  . Years of Education: 12   Occupational History  . retired    Social History Main Topics  . Smoking status: Former Smoker    Quit date: 07/03/1958  . Smokeless tobacco: Never Used  . Alcohol Use: No     Comment: occasional  . Drug Use: No  . Sexual Activity: Not on file   Other Topics Concern  . Not on file   Social History Narrative    Family History  Problem Relation Age of Onset  . Cancer - Other Mother     breast  . Stroke Father    ROS General: Negative; No fevers, chills, or night sweats;  HEENT: Positive for cataracts; No changes  hearing, sinus congestion, difficulty swallowing Pulmonary: Negative; No cough, wheezing, shortness of breath, hemoptysis Cardiovascular: See history of present illness GI: Negative; No nausea, vomiting, diarrhea, or abdominal pain GU: Negative; No dysuria, hematuria, or difficulty voiding Musculoskeletal: Had left knee surgery by Dr. Percell Miller.  History of left hip bursitis. Hematologic/Oncology: Negative; no easy bruising, bleeding Endocrine: Negative; no heat/cold intolerance; no diabetes Neuro: Negative; no changes in balance, headaches Skin: Negative; No rashes or skin lesions Psychiatric: Negative; No behavioral problems, depression Sleep: History of complex sleep apnea; currently not on therapy snoring, daytime  sleepiness, hypersomnolence, bruxism, restless legs, hypnogognic hallucinations, no cataplexy Other comprehensive 14 point system review is negative.   PE BP 134/73 mmHg  Pulse 68  Ht '5\' 7"'  (1.702 m)  Wt 168 lb 6.4 oz (76.386 kg)  BMI 26.37 kg/m2  Repeat blood pressure by me 136/78  Wt Readings from Last 3 Encounters:  04/27/16 168 lb 6.4 oz (76.386 kg)  03/11/15 170 lb 14.4 oz (77.52 kg)  08/20/14 162 lb 9.6 oz (73.755 kg)   General: Alert, oriented, no distress.  Skin: normal turgor, no rashes HEENT: Normocephalic, atraumatic. Pupils round and reactive; sclera anicteric;no lid lag. Extraocular muscles intact. Nose without nasal septal hypertrophy Mouth/Parynx benign; Mallinpatti  scale 3 Neck: No JVD, no carotid bruits; normal carotid upstroke Lungs: clear to ausculatation and percussion; no wheezing or rales Chest wall: no tenderness to palpitation Heart: RRR, s1 s2 normal 1/6 systolic murmur along the left sternal border. No gallop or rubs. Abdomen: soft, nontender; no hepatosplenomehaly, BS+; abdominal aorta nontender and not dilated by palpation. Back: no CVA tenderness Pulses 2+ Extremities: no clubbing cyanosis or edema, Homan's sign negative  Neurologic: grossly nonfocal; cranial nerves grossly normal. Psychologic: normal affect and mood.  ECG (independently read by me): Normal sinus rhythm at 68 bpm.  Nonspecific ST changes inferolaterally.  QTc interval 412 ms.  April 2016 ECG (independently read by me): Normal sinus rhythm at 80 bpm.  Normal intervals.  No significant ST segment changes  January 2015 ECG (independently read by me): Sinus rhythm 87 beats per minute. Complete left bundle-branch block. Nonspecific T changes.  LABS:  BMP Latest Ref Rng 08/17/2014 01/03/2010  Glucose 70 - 99 mg/dL 119(H) 97  BUN 6 - 23 mg/dL 18 24(H)  Creatinine 0.50 - 1.35 mg/dL 1.16 1.44  Sodium 137 - 147 mEq/L 141 140  Potassium 3.7 - 5.3 mEq/L 4.0 3.8  Chloride 96 - 112 mEq/L 104  106  CO2 19 - 32 mEq/L 23 24  Calcium 8.4 - 10.5 mg/dL 9.3 9.5    Hepatic Function Latest Ref Rng 01/03/2010  Total Protein 6.0 - 8.3 g/dL 6.9  Albumin 3.5 - 5.2 g/dL 3.9  AST 0 - 37 U/L 18  ALT 0 - 53 U/L 15  Alk Phosphatase 39 - 117 U/L 85  Total Bilirubin 0.3 - 1.2 mg/dL 0.5    No results found for: WBC   BNP No results found for: PROBNP  Lipid Panel  No results found for: CHOL   RADIOLOGY: No results found.    ASSESSMENT AND PLAN: Bruce Gray is a 75 years old gentleman who is 31 years status post CABG surgery done by Dr. Glade Nurse in 1986. His last cardiac catheterization was done by Dr. Rex Kras in July 2010 which showed dense calcification of his native coronary arteries. All native vessels were occluded proximally. There was a patent  sequential graft to the diagonal and OM with a proximal tapering of 40%. The sequential graft to the AM and PDA was large caliber and widely patent. His LIMA graft to the LAD was small and patent.  Over the past year, he has remained active and  continues to be chest pain-free on his current medical regimen.  His blood pressure today was stable on his current regimen consisting of amlodipine 5 mg, carvedilol 6.25 mg twice a day in addition to his Lasix 20 mg low-dose nitrate therapy.  He does not have edema.  He has been taking omeprazole for GERD.  He denies myalgias and is tolerating atorvastatin and low-dose niacin.  Dr. Zadie Rhine has been checking blood work.  I do not have the specifics of her lab work from last year and shoe be rechecking lab work over the next several months.  I will last of these be forwarded to me for my review.  His weight is stable with a BMI of 26.37.  We discussed importance of continued exercise with activity, at least 5 days per week for at least 30 minutes if possible.  As long as he remains stable I will see him in one year for reevaluation.   Time spent: 25 minutes  Troy Sine, MD, [facility]  04/27/2016 6:02  PM

## 2016-04-27 NOTE — Patient Instructions (Signed)
Your physician wants you to follow-up in: 1 year or sooner if needed. You will receive a reminder letter in the mail two months in advance. If you don't receive a letter, please call our office to schedule the follow-up appointment.   If you need a refill on your cardiac medications before your next appointment, please call your pharmacy.   

## 2016-05-18 DIAGNOSIS — H18412 Arcus senilis, left eye: Secondary | ICD-10-CM | POA: Diagnosis not present

## 2016-05-18 DIAGNOSIS — H2512 Age-related nuclear cataract, left eye: Secondary | ICD-10-CM | POA: Diagnosis not present

## 2016-05-18 DIAGNOSIS — H2513 Age-related nuclear cataract, bilateral: Secondary | ICD-10-CM | POA: Diagnosis not present

## 2016-05-18 DIAGNOSIS — H18411 Arcus senilis, right eye: Secondary | ICD-10-CM | POA: Diagnosis not present

## 2016-05-18 DIAGNOSIS — H2511 Age-related nuclear cataract, right eye: Secondary | ICD-10-CM | POA: Diagnosis not present

## 2016-05-23 DIAGNOSIS — Z7984 Long term (current) use of oral hypoglycemic drugs: Secondary | ICD-10-CM | POA: Diagnosis not present

## 2016-05-23 DIAGNOSIS — I10 Essential (primary) hypertension: Secondary | ICD-10-CM | POA: Diagnosis not present

## 2016-05-23 DIAGNOSIS — E785 Hyperlipidemia, unspecified: Secondary | ICD-10-CM | POA: Diagnosis not present

## 2016-05-23 DIAGNOSIS — E119 Type 2 diabetes mellitus without complications: Secondary | ICD-10-CM | POA: Diagnosis not present

## 2016-05-24 ENCOUNTER — Telehealth: Payer: Self-pay | Admitting: *Deleted

## 2016-05-24 NOTE — Telephone Encounter (Signed)
Requesting surgical clearance:   1. Type of surgery: right and left eye  2. Surgeon:   3. Surgical date:  June 26 AND July 14  4. Medications that need to be held: ASA                       Special instructions: [place]

## 2016-05-28 NOTE — Telephone Encounter (Signed)
[place] for surgery and hold ASA.

## 2016-05-28 NOTE — Telephone Encounter (Signed)
Faxed surgical clearance to Dr Rosalva Ferron. [employer].

## 2016-06-05 DIAGNOSIS — I8311 Varicose veins of right lower extremity with inflammation: Secondary | ICD-10-CM | POA: Diagnosis not present

## 2016-06-05 DIAGNOSIS — L820 Inflamed seborrheic keratosis: Secondary | ICD-10-CM | POA: Diagnosis not present

## 2016-06-05 DIAGNOSIS — I8312 Varicose veins of left lower extremity with inflammation: Secondary | ICD-10-CM | POA: Diagnosis not present

## 2016-06-05 DIAGNOSIS — I872 Venous insufficiency (chronic) (peripheral): Secondary | ICD-10-CM | POA: Diagnosis not present

## 2016-06-07 ENCOUNTER — Other Ambulatory Visit: Payer: Self-pay | Admitting: Cardiovascular Disease

## 2016-06-07 NOTE — Telephone Encounter (Signed)
Rx(s) sent to pharmacy electronically.  

## 2016-06-13 DIAGNOSIS — C61 Malignant neoplasm of prostate: Secondary | ICD-10-CM | POA: Diagnosis not present

## 2016-06-18 ENCOUNTER — Other Ambulatory Visit: Payer: Self-pay | Admitting: Cardiovascular Disease

## 2016-06-18 DIAGNOSIS — Z0000 Encounter for general adult medical examination without abnormal findings: Secondary | ICD-10-CM | POA: Diagnosis not present

## 2016-06-18 DIAGNOSIS — E785 Hyperlipidemia, unspecified: Secondary | ICD-10-CM | POA: Diagnosis not present

## 2016-06-18 DIAGNOSIS — E119 Type 2 diabetes mellitus without complications: Secondary | ICD-10-CM | POA: Diagnosis not present

## 2016-06-18 DIAGNOSIS — I10 Essential (primary) hypertension: Secondary | ICD-10-CM | POA: Diagnosis not present

## 2016-06-18 DIAGNOSIS — Z7984 Long term (current) use of oral hypoglycemic drugs: Secondary | ICD-10-CM | POA: Diagnosis not present

## 2016-06-18 DIAGNOSIS — Z8546 Personal history of malignant neoplasm of prostate: Secondary | ICD-10-CM | POA: Diagnosis not present

## 2016-06-18 NOTE — Telephone Encounter (Signed)
REFILL 

## 2016-06-18 NOTE — Telephone Encounter (Signed)
Omeprazole refilled 06/18/16

## 2016-06-20 DIAGNOSIS — Z8546 Personal history of malignant neoplasm of prostate: Secondary | ICD-10-CM | POA: Diagnosis not present

## 2016-08-28 DIAGNOSIS — H6120 Impacted cerumen, unspecified ear: Secondary | ICD-10-CM | POA: Diagnosis not present

## 2016-08-28 DIAGNOSIS — R6884 Jaw pain: Secondary | ICD-10-CM | POA: Diagnosis not present

## 2016-09-18 DIAGNOSIS — H903 Sensorineural hearing loss, bilateral: Secondary | ICD-10-CM | POA: Diagnosis not present

## 2016-09-18 DIAGNOSIS — J310 Chronic rhinitis: Secondary | ICD-10-CM | POA: Diagnosis not present

## 2016-09-18 DIAGNOSIS — R070 Pain in throat: Secondary | ICD-10-CM | POA: Diagnosis not present

## 2016-09-18 DIAGNOSIS — K219 Gastro-esophageal reflux disease without esophagitis: Secondary | ICD-10-CM | POA: Diagnosis not present

## 2016-12-17 DIAGNOSIS — E785 Hyperlipidemia, unspecified: Secondary | ICD-10-CM | POA: Diagnosis not present

## 2016-12-17 DIAGNOSIS — E119 Type 2 diabetes mellitus without complications: Secondary | ICD-10-CM | POA: Diagnosis not present

## 2016-12-17 DIAGNOSIS — Z7984 Long term (current) use of oral hypoglycemic drugs: Secondary | ICD-10-CM | POA: Diagnosis not present

## 2016-12-17 DIAGNOSIS — I10 Essential (primary) hypertension: Secondary | ICD-10-CM | POA: Diagnosis not present

## 2016-12-24 DIAGNOSIS — E119 Type 2 diabetes mellitus without complications: Secondary | ICD-10-CM | POA: Diagnosis not present

## 2016-12-24 DIAGNOSIS — K219 Gastro-esophageal reflux disease without esophagitis: Secondary | ICD-10-CM | POA: Diagnosis not present

## 2016-12-24 DIAGNOSIS — Z7984 Long term (current) use of oral hypoglycemic drugs: Secondary | ICD-10-CM | POA: Diagnosis not present

## 2016-12-24 DIAGNOSIS — I10 Essential (primary) hypertension: Secondary | ICD-10-CM | POA: Diagnosis not present

## 2016-12-24 DIAGNOSIS — E785 Hyperlipidemia, unspecified: Secondary | ICD-10-CM | POA: Diagnosis not present

## 2016-12-24 DIAGNOSIS — N189 Chronic kidney disease, unspecified: Secondary | ICD-10-CM | POA: Diagnosis not present

## 2017-01-08 DIAGNOSIS — N183 Chronic kidney disease, stage 3 (moderate): Secondary | ICD-10-CM | POA: Diagnosis not present

## 2017-01-08 DIAGNOSIS — I10 Essential (primary) hypertension: Secondary | ICD-10-CM | POA: Diagnosis not present

## 2017-01-08 DIAGNOSIS — E119 Type 2 diabetes mellitus without complications: Secondary | ICD-10-CM | POA: Diagnosis not present

## 2017-03-02 ENCOUNTER — Other Ambulatory Visit: Payer: Self-pay | Admitting: Cardiovascular Disease

## 2017-03-04 NOTE — Telephone Encounter (Signed)
Rx(s) sent to pharmacy electronically.  

## 2017-03-26 DIAGNOSIS — H5210 Myopia, unspecified eye: Secondary | ICD-10-CM | POA: Diagnosis not present

## 2017-04-12 ENCOUNTER — Encounter: Payer: Self-pay | Admitting: Cardiovascular Disease

## 2017-04-12 ENCOUNTER — Ambulatory Visit (INDEPENDENT_AMBULATORY_CARE_PROVIDER_SITE_OTHER): Payer: Medicare HMO | Admitting: Cardiovascular Disease

## 2017-04-12 VITALS — BP 138/72 | HR 71 | Ht 67.0 in | Wt 166.0 lb

## 2017-04-12 DIAGNOSIS — G4733 Obstructive sleep apnea (adult) (pediatric): Secondary | ICD-10-CM | POA: Diagnosis not present

## 2017-04-12 DIAGNOSIS — K219 Gastro-esophageal reflux disease without esophagitis: Secondary | ICD-10-CM | POA: Diagnosis not present

## 2017-04-12 DIAGNOSIS — I2510 Atherosclerotic heart disease of native coronary artery without angina pectoris: Secondary | ICD-10-CM | POA: Diagnosis not present

## 2017-04-12 DIAGNOSIS — E119 Type 2 diabetes mellitus without complications: Secondary | ICD-10-CM | POA: Diagnosis not present

## 2017-04-12 DIAGNOSIS — R011 Cardiac murmur, unspecified: Secondary | ICD-10-CM

## 2017-04-12 DIAGNOSIS — E785 Hyperlipidemia, unspecified: Secondary | ICD-10-CM

## 2017-04-12 NOTE — Progress Notes (Signed)
Patient ID: Bruce Gray, male   DOB: 1940-12-31, 76 y.o.   MRN: [id number]     Primary M.D.: Dr. Harlene Ramus  HPI: Bruce Gray is a 76 y.o. male who is a former patient of Dr. Rex Kras who  presents for one-year follow-up cardiology evaluation.  Bruce Gray has known CAD and underwent CABG revascularization surgery in 1986 by Dr. Glade Nurse.  He has a  history of prostate CA treated with seed implantation, type 2 diabetes mellitus, mild renal insufficiency and also has a history of complex sleep apnea for which he has refused to be treated. There also is a history of gout.  Since his bypass surgery, he believes he has had 2 heart catheterizations by Dr. Rex Kras with in the 1990s and his last in 2010.  He was told that his grafts remained normal.  An echo Doppler study in January 2014 showed normal systolic function with ejection fraction of 55-60% with grade 1 diastolic dysfunction. There is mild primary hypertension with estimated PA pressure 34 mm. He had mild atrial dilatation.  His last nuclear perfusion study in January 2014 showed normal perfusion and function with apical thinning.  Since I last saw him in May 2017, he has continued to remain stable without recurrent anginal type symptomatology.  He sees Dr. Arnell Asal, for primary care and had laboratory done on 12/17/2016.  Hemoglobin A1c was 6.6, BUN 24 Cr1.4.  LFTs were normal.  Total cholesterol was 138, HDL 41, LDL 68, and triglycerides 145 with a non-HDL of 97.  He notes fatigue.  He has no interest in pursuing his previous diagnosis of sleep apnea.  He is a very strong family history for heart disease and for his brothers have died as a result of this, after he had initially undergone his CABG.  He has not been exercising regularly.  At times he notes some transient ankle swelling.  He presents for evaluation.  Past Medical History:  Diagnosis Date  . CAD (coronary artery disease)   . Complex sleep apnea syndrome      does not use a cpap-cannot  . Diabetes mellitus ([place])    type 2  . Gout   . Heart disease   . High cholesterol   . HOH (hard of hearing)   . Hypertension   . Liver disease   . Mild renal insufficiency   . Prostate cancer ([place])    seed implantation    Past Surgical History:  Procedure Laterality Date  . CARDIAC CATHETERIZATION  86,2010  . CARDIOVASCULAR STRESS TEST  2008  . CHOLECYSTECTOMY  2011  . COLONOSCOPY    . CORONARY ARTERY BYPASS GRAFT  1986  . DOPPLER ECHOCARDIOGRAPHY     mild concentric LVH with mild grade 1 diastolic dysfunction with normal tissue doppler parameters  . HEMORRHOID SURGERY  2007  . INSERTION PROSTATE RADIATION SEED  2013  . KNEE ARTHROSCOPY WITH MEDIAL MENISECTOMY Left 08/20/2014   Procedure: LEFT KNEE ARTHROSCOPY WITH DEBRIDEMENT/SHAVING (CHONDROPLASTY), MEDIAL MENISECTOMY, EXCISION OF PLICA;  Surgeon: Ninetta Lights, MD;  Location: [place] [facility];  Service: Orthopedics;  Laterality: Left;  . TONSILLECTOMY      Allergies  Allergen Reactions  . Codeine Nausea And Vomiting  . Prevacid [Lansoprazole] Nausea And Vomiting    Current Outpatient Prescriptions  Medication Sig Dispense Refill  . allopurinol (ZYLOPRIM) 100 MG tablet Take 200 mg by mouth daily.     Marland Kitchen amLODipine (NORVASC) 5 MG tablet Take 1 tablet (5  mg total) by mouth daily. 90 tablet 3  . aspirin 81 MG tablet Take 81 mg by mouth daily.    Marland Kitchen atorvastatin (LIPITOR) 40 MG tablet TAKE 1 TABLET DAILY AT 6 PM. 90 tablet 0  . carvedilol (COREG) 6.25 MG tablet Take 1 tablet (6.25 mg total) by mouth 2 (two) times daily with a meal. 180 tablet 3  . docusate sodium (COLACE) 100 MG capsule Take 100 mg by mouth daily.    . furosemide (LASIX) 20 MG tablet Take 20 mg by mouth every other day.     . isosorbide mononitrate (IMDUR) 30 MG 24 hr tablet TAKE 1 TABLET EVERY DAY (Patient taking differently: TAKE 1/2 TABLET EVERY DAY) 90 tablet 3  . metFORMIN (GLUCOPHAGE-XR) 500 MG 24 hr tablet  Take 500 mg by mouth daily.    . niacin 500 MG tablet Take 500 mg by mouth daily with breakfast.    . omeprazole (PRILOSEC) 20 MG capsule Take 1 capsule (20 mg total) by mouth daily. 90 capsule 3   No current facility-administered medications for this visit.     Social History   Social History  . Marital status: Married    Spouse name: N/A  . Number of children: 2  . Years of education: 12   Occupational History  . retired    Social History Main Topics  . Smoking status: Former Smoker    Quit date: 07/03/1958  . Smokeless tobacco: Never Used  . Alcohol use No     Comment: occasional  . Drug use: No  . Sexual activity: Not on file   Other Topics Concern  . Not on file   Social History Narrative  . No narrative on file    Family History  Problem Relation Age of Onset  . Cancer - Other Mother     breast  . Stroke Father    ROS General: Negative; No fevers, chills, or night sweats;  HEENT: Positive for cataracts; No changes  hearing, sinus congestion, difficulty swallowing Pulmonary: Negative; No cough, wheezing, shortness of breath, hemoptysis Cardiovascular: See history of present illness GI: Negative; No nausea, vomiting, diarrhea, or abdominal pain GU: Negative; No dysuria, hematuria, or difficulty voiding Musculoskeletal: Had left knee surgery by Dr. Percell Miller.  History of left hip bursitis. Hematologic/Oncology: Negative; no easy bruising, bleeding Endocrine: Negative; no heat/cold intolerance; no diabetes Neuro: Negative; no changes in balance, headaches Skin: Negative; No rashes or skin lesions Psychiatric: Negative; No behavioral problems, depression Sleep: History of complex sleep apnea; currently not on therapy snoring, daytime sleepiness, hypersomnolence, bruxism, restless legs, hypnogognic hallucinations, no cataplexy Other comprehensive 14 point system review is negative.   PE BP 138/72   Pulse 71   Ht _0  (1.702 m)   Wt 166 lb (75.3 kg)   BMI  26.00 kg/m   Repeat blood pressure by me 130/74  Wt Readings from Last 3 Encounters:  04/12/17 166 lb (75.3 kg)  04/27/16 168 lb 6.4 oz (76.4 kg)  03/11/15 170 lb 14.4 oz (77.5 kg)   General: Alert, oriented, no distress.  Skin: normal turgor, no rashes HEENT: Normocephalic, atraumatic. Pupils round and reactive; sclera anicteric;no lid lag. Extraocular muscles intact. Nose without nasal septal hypertrophy Mouth/Parynx benign; Mallinpatti scale 3 Neck: No JVD, no carotid bruits; normal carotid upstroke Lungs: clear to ausculatation and percussion; no wheezing or rales Chest wall: no tenderness to palpitation Heart: RRR, s1 s2 normal 2/6 systolic murmur along the upper right and left sternal border. No gallop  or rubs. Abdomen: soft, nontender; no hepatosplenomehaly, BS+; abdominal aorta nontender and not dilated by palpation. Back: no CVA tenderness Pulses 2+ Extremities: no clubbing cyanosis or edema, Homan's sign negative  Neurologic: grossly nonfocal; cranial nerves grossly normal. Psychologic: normal affect and mood.  ECG (independently read by me): Normal sinus rhythm at 71 bpm with sinus arrhythmia.  Nonspecific T changes.  Normal intervals.  May 2017 ECG (independently read by me): Normal sinus rhythm at 68 bpm.  Nonspecific ST changes inferolaterally.  QTc interval 412 ms.  April 2016 ECG (independently read by me): Normal sinus rhythm at 80 bpm.  Normal intervals.  No significant ST segment changes  January 2015 ECG (independently read by me): Sinus rhythm 87 beats per minute. Complete left bundle-branch block. Nonspecific T changes.  LABS:  BMP Latest Ref Rng & Units 08/17/2014 01/03/2010  Glucose 70 - 99 mg/dL 119(H) 97  BUN 6 - 23 mg/dL 18 24(H)  Creatinine 0.50 - 1.35 mg/dL 1.16 1.44  Sodium 137 - 147 mEq/L 141 140  Potassium 3.7 - 5.3 mEq/L 4.0 3.8  Chloride 96 - 112 mEq/L 104 106  CO2 19 - 32 mEq/L 23 24  Calcium 8.4 - 10.5 mg/dL 9.3 9.5    Hepatic Function  Latest Ref Rng & Units 01/03/2010  Total Protein 6.0 - 8.3 g/dL 6.9  Albumin 3.5 - 5.2 g/dL 3.9  AST 0 - 37 U/L 18  ALT 0 - 53 U/L 15  Alk Phosphatase 39 - 117 U/L 85  Total Bilirubin 0.3 - 1.2 mg/dL 0.5    No results found for: WBC   BNP No results found for: PROBNP  Lipid Panel  No results found for: CHOL   RADIOLOGY: No results found.  IMPRESSION:  1. Systolic murmur   2. CAD in native artery   3. OSA (obstructive sleep apnea)   4. Hyperlipidemia with target LDL less than 70   5. Gastroesophageal reflux disease without esophagitis   6. Type 2 diabetes mellitus without complication, without long-term current use of insulin ([place])     ASSESSMENT AND PLAN: Bruce Gray is a 76 years old gentleman who is 32 years status post CABG surgery done by Dr. Glade Nurse in 1986. His last cardiac catheterization was done by Dr. Rex Kras in July 2010 which showed dense calcification of his native coronary arteries. All native vessels were occluded proximally. There was a patent  sequential graft to the diagonal and OM with a proximal tapering of 40%. The sequential graft to the AM and PDA was large caliber and widely patent. His LIMA graft to the LAD was small and patent.  Over the past year, he has remained active and continues to be chest pain-free on his current medical regimen.  He continues to be on amlodipine 5 mg, isosorbide 30 mg, nadolol 6.25 mg twice a day for both his blood pressure and CAD.  He is not having anginal symptoms.  His blood pressure today is stable.  He is diabetic on metformin and his most recent hemoglobin A1c was 6.6.  He is on combination therapy with atorvastatin 40 mg and continues to be on low-dose niacin 500 mg.  His lipid studies remain well-controlled.  His GERD is controlled with omeprazole.  He does not wish to pursue further evaluation of his sleep apnea.  His physical exam suggests an increase in his systolic murmur compared to previously.  His last echo  Doppler study was in January 2014.  I have suggested a follow-up  echo evaluation.  I have recommended he increase his exercise.  His weight is stable on BMI is excellent at 26.  I will see him back in the office in 6 months for reevaluation.    Time spent: 25 minutes Troy Sine, MD, [facility]  04/12/2017 2:09 PM

## 2017-04-12 NOTE — Patient Instructions (Signed)
Medication Instructions:  Continue current medications  Labwork: None Ordered  Testing/Procedures: Your physician has requested that you have an echocardiogram. Echocardiography is a painless test that uses sound waves to create images of your heart. It provides your doctor with information about the size and shape of your heart and how well your heart's chambers and valves are working. This procedure takes approximately one hour. There are no restrictions for this procedure.   Follow-Up: Your physician wants you to follow-up in: 6 Months. You will receive a reminder letter in the mail two months in advance. If you don't receive a letter, please call our office to schedule the follow-up appointment.   Any Other Special Instructions Will Be Listed Below (If Applicable).     If you need a refill on your cardiac medications before your next appointment, please call your pharmacy.

## 2017-04-24 ENCOUNTER — Ambulatory Visit (HOSPITAL_COMMUNITY): Payer: Medicare HMO | Attending: Cardiovascular Disease

## 2017-04-24 ENCOUNTER — Other Ambulatory Visit: Payer: Self-pay

## 2017-04-24 DIAGNOSIS — I2510 Atherosclerotic heart disease of native coronary artery without angina pectoris: Secondary | ICD-10-CM | POA: Diagnosis not present

## 2017-04-24 DIAGNOSIS — E785 Hyperlipidemia, unspecified: Secondary | ICD-10-CM | POA: Insufficient documentation

## 2017-04-24 DIAGNOSIS — Z951 Presence of aortocoronary bypass graft: Secondary | ICD-10-CM | POA: Insufficient documentation

## 2017-04-24 DIAGNOSIS — R011 Cardiac murmur, unspecified: Secondary | ICD-10-CM | POA: Diagnosis not present

## 2017-04-24 DIAGNOSIS — I340 Nonrheumatic mitral (valve) insufficiency: Secondary | ICD-10-CM | POA: Diagnosis not present

## 2017-04-24 DIAGNOSIS — I119 Hypertensive heart disease without heart failure: Secondary | ICD-10-CM | POA: Insufficient documentation

## 2017-04-24 LAB — ECHOCARDIOGRAM COMPLETE
CHL CUP RV SYS PRESS: 24 mmHg
CHL CUP TV REG PEAK VELOCITY: 229 cm/s
E decel time: 197 msec
EERAT: 10.46
FS: 25 % — AB (ref 28–44)
IVS/LV PW RATIO, ED: 1.15
LA ID, A-P, ES: 39 mm
LA diam end sys: 39 mm
LA diam index: 2.09 cm/m2
LA vol A4C: 37.8 ml
LA vol: 43.2 mL
LAVOLIN: 23.1 mL/m2
LV E/e'average: 10.46
LV TDI E'LATERAL: 8.49
LV TDI E'MEDIAL: 4.68
LV e' LATERAL: 8.49 cm/s
LVEEMED: 10.46
LVOT VTI: 15.5 cm
LVOT area: 4.52 cm2
LVOT diameter: 24 mm
LVOTPV: 70.9 cm/s
LVOTSV: 70 mL
Lateral S' vel: 8.49 cm/s
MV Dec: 197
MV pk A vel: 108 m/s
MV pk E vel: 88.8 m/s
MVPG: 3 mmHg
PW: 10.4 mm — AB (ref 0.6–1.1)
TAPSE: 18.9 mm
TR max vel: 229 cm/s

## 2017-06-11 ENCOUNTER — Other Ambulatory Visit: Payer: Self-pay | Admitting: Cardiovascular Disease

## 2017-06-18 DIAGNOSIS — C61 Malignant neoplasm of prostate: Secondary | ICD-10-CM | POA: Diagnosis not present

## 2017-06-18 DIAGNOSIS — Z7984 Long term (current) use of oral hypoglycemic drugs: Secondary | ICD-10-CM | POA: Diagnosis not present

## 2017-06-18 DIAGNOSIS — I10 Essential (primary) hypertension: Secondary | ICD-10-CM | POA: Diagnosis not present

## 2017-06-18 DIAGNOSIS — E119 Type 2 diabetes mellitus without complications: Secondary | ICD-10-CM | POA: Diagnosis not present

## 2017-06-24 DIAGNOSIS — K219 Gastro-esophageal reflux disease without esophagitis: Secondary | ICD-10-CM | POA: Diagnosis not present

## 2017-06-24 DIAGNOSIS — N189 Chronic kidney disease, unspecified: Secondary | ICD-10-CM | POA: Diagnosis not present

## 2017-06-24 DIAGNOSIS — E119 Type 2 diabetes mellitus without complications: Secondary | ICD-10-CM | POA: Diagnosis not present

## 2017-06-24 DIAGNOSIS — Z7984 Long term (current) use of oral hypoglycemic drugs: Secondary | ICD-10-CM | POA: Diagnosis not present

## 2017-06-24 DIAGNOSIS — E785 Hyperlipidemia, unspecified: Secondary | ICD-10-CM | POA: Diagnosis not present

## 2017-06-24 DIAGNOSIS — I10 Essential (primary) hypertension: Secondary | ICD-10-CM | POA: Diagnosis not present

## 2017-06-25 ENCOUNTER — Other Ambulatory Visit: Payer: Self-pay | Admitting: Cardiovascular Disease

## 2017-06-26 DIAGNOSIS — N401 Enlarged prostate with lower urinary tract symptoms: Secondary | ICD-10-CM | POA: Diagnosis not present

## 2017-06-26 DIAGNOSIS — R3912 Poor urinary stream: Secondary | ICD-10-CM | POA: Diagnosis not present

## 2017-06-26 DIAGNOSIS — Z8546 Personal history of malignant neoplasm of prostate: Secondary | ICD-10-CM | POA: Diagnosis not present

## 2017-06-26 NOTE — Telephone Encounter (Signed)
Rx has been sent to the pharmacy electronically. ° °

## 2017-10-25 ENCOUNTER — Encounter: Payer: Self-pay | Admitting: Cardiovascular Disease

## 2017-10-25 ENCOUNTER — Ambulatory Visit: Payer: Medicare HMO | Admitting: Cardiovascular Disease

## 2017-10-25 VITALS — BP 112/60 | HR 66 | Ht 67.0 in | Wt 167.0 lb

## 2017-10-25 DIAGNOSIS — Z79899 Other long term (current) drug therapy: Secondary | ICD-10-CM | POA: Diagnosis not present

## 2017-10-25 DIAGNOSIS — G4733 Obstructive sleep apnea (adult) (pediatric): Secondary | ICD-10-CM | POA: Diagnosis not present

## 2017-10-25 DIAGNOSIS — E785 Hyperlipidemia, unspecified: Secondary | ICD-10-CM | POA: Diagnosis not present

## 2017-10-25 DIAGNOSIS — I2510 Atherosclerotic heart disease of native coronary artery without angina pectoris: Secondary | ICD-10-CM | POA: Diagnosis not present

## 2017-10-25 DIAGNOSIS — E119 Type 2 diabetes mellitus without complications: Secondary | ICD-10-CM

## 2017-10-25 DIAGNOSIS — K219 Gastro-esophageal reflux disease without esophagitis: Secondary | ICD-10-CM

## 2017-10-25 MED ORDER — EZETIMIBE 10 MG PO TABS: 10.0000 mg | ORAL_TABLET | Freq: Every day | ORAL | 3 refills | Status: DC

## 2017-10-25 NOTE — Progress Notes (Signed)
Patient ID: Bruce Gray, male   DOB: 07-31-1941, 76 y.o.   MRN: [id number]     Primary M.D.: Dr. Harlene Ramus  HPI: Bruce Gray is a 76 y.o. male who is a former patient of Dr. Rex Kras who  presents for a 6 month follow-up cardiology evaluation.  Bruce Gray has known CAD and underwent CABG revascularization surgery in 1986 by Dr. Glade Nurse.  He has a  history of prostate CA treated with seed implantation, type 2 diabetes mellitus, mild renal insufficiency and also has a history of complex sleep apnea for which he has refused to be treated. There also is a history of gout.  Since his bypass surgery, he believes he has had 2 heart catheterizations by Dr. Rex Kras with in the 1990s and his last in 2010.  He was told that his grafts remained normal.  An echo Doppler study in January 2014 showed normal systolic function with ejection fraction of 55-60% with grade 1 diastolic dysfunction. There is mild primary hypertension with estimated PA pressure 34 mm. He had mild atrial dilatation.  His last nuclear perfusion study in January 2014 showed normal perfusion and function with apical thinning.  When saw him in May 2017, he was stable without recurrent anginal type symptomatology.  He sees Dr. Arnell Asal, for primary care and had laboratory  on 12/17/2016.  Hemoglobin A1c was 6.6, BUN 24 Cr1.4.  LFTs were normal.  Total cholesterol was 138, HDL 41, LDL 68, and triglycerides 145 with a non-HDL of 97.  He notes fatigue.  He has no interest in pursuing his previous diagnosis of sleep apnea.  He is a very strong family history for heart disease and for his brothers have died as a result of this, after he had initially undergone his CABG.   Since I last saw him, he has felt well.  He believes he is sleeping better since he is reduce the amount of tea that he had been drinking, which often would keep him awake.  He denies any anginal symptoms.  He underwent an echo Doppler study in May 2018  which showed an EF of 50-55%.  He had normal diastolic parameters.  There was mild MR.  His left atrium was mildly dilated.  He had laboratory done by his primary physician in July 2018.  Total cholesterol was 144, HDL 40, LDL 75, triglycerides 146.  Hemoglobin A1c 6.6.  He had normal LFTs.  He presents for evaluation.  Past Medical History:  Diagnosis Date  . CAD (coronary artery disease)   . Complex sleep apnea syndrome    does not use a cpap-cannot  . Diabetes mellitus ([place])    type 2  . Gout   . Heart disease   . High cholesterol   . HOH (hard of hearing)   . Hypertension   . Liver disease   . Mild renal insufficiency   . Prostate cancer ([place])    seed implantation    Past Surgical History:  Procedure Laterality Date  . CARDIAC CATHETERIZATION  86,2010  . CARDIOVASCULAR STRESS TEST  2008  . CHOLECYSTECTOMY  2011  . COLONOSCOPY    . CORONARY ARTERY BYPASS GRAFT  1986  . DOPPLER ECHOCARDIOGRAPHY     mild concentric LVH with mild grade 1 diastolic dysfunction with normal tissue doppler parameters  . HEMORRHOID SURGERY  2007  . INSERTION PROSTATE RADIATION SEED  2013  . LEFT KNEE ARTHROSCOPY WITH DEBRIDEMENT/SHAVING (CHONDROPLASTY), MEDIAL MENISECTOMY, EXCISION OF PLICA Left 9/93/7169  Performed by Ninetta Lights, MD at [facility]  . TONSILLECTOMY      Allergies  Allergen Reactions  . Codeine Nausea And Vomiting  . Prevacid [Lansoprazole] Nausea And Vomiting    Current Outpatient Medications  Medication Sig Dispense Refill  . allopurinol (ZYLOPRIM) 100 MG tablet Take 200 mg by mouth daily.     Marland Kitchen amLODipine (NORVASC) 5 MG tablet Take 1 tablet (5 mg total) by mouth daily. 90 tablet 3  . aspirin 81 MG tablet Take 81 mg by mouth daily.    Marland Kitchen atorvastatin (LIPITOR) 40 MG tablet TAKE 1 TABLET DAILY AT 6 PM. 90 tablet 1  . carvedilol (COREG) 6.25 MG tablet Take 1 tablet (6.25 mg total) by mouth 2 (two) times daily with a meal. 180 tablet 3  . docusate sodium  (COLACE) 100 MG capsule Take 100 mg by mouth daily.    . furosemide (LASIX) 20 MG tablet Take 20 mg by mouth every other day.     . isosorbide mononitrate (IMDUR) 30 MG 24 hr tablet TAKE 1 TABLET EVERY DAY (Patient taking differently: TAKE 1/2 TABLET EVERY DAY) 90 tablet 3  . metFORMIN (GLUCOPHAGE-XR) 500 MG 24 hr tablet Take 500 mg by mouth daily.    Marland Kitchen omeprazole (PRILOSEC) 20 MG capsule TAKE 1 CAPSULE (20 MG TOTAL) BY MOUTH DAILY. 90 capsule 3  . ezetimibe (ZETIA) 10 MG tablet Take 1 tablet (10 mg total) daily by mouth. 90 tablet 3   No current facility-administered medications for this visit.     Social History   Socioeconomic History  . Marital status: Married    Spouse name: Not on file  . Number of children: 2  . Years of education: 38  . Highest education level: Not on file  Social Needs  . Financial resource strain: Not on file  . Food insecurity - worry: Not on file  . Food insecurity - inability: Not on file  . Transportation needs - medical: Not on file  . Transportation needs - non-medical: Not on file  Occupational History  . Occupation: retired  Tobacco Use  . Smoking status: Former Smoker    Last attempt to quit: 07/03/1958    Years since quitting: 59.3  . Smokeless tobacco: Never Used  Substance and Sexual Activity  . Alcohol use: No    Comment: occasional  . Drug use: No  . Sexual activity: Not on file  Other Topics Concern  . Not on file  Social History Narrative  . Not on file    Family History  Problem Relation Age of Onset  . Cancer - Other Mother        breast  . Stroke Father    ROS General: Negative; No fevers, chills, or night sweats;  HEENT: Positive for cataracts; No changes  hearing, sinus congestion, difficulty swallowing Pulmonary: Negative; No cough, wheezing, shortness of breath, hemoptysis Cardiovascular: See history of present illness GI: Negative; No nausea, vomiting, diarrhea, or abdominal pain GU: Negative; No dysuria,  hematuria, or difficulty voiding Musculoskeletal: Had left knee surgery by Dr. Percell Miller.  History of left hip bursitis. Hematologic/Oncology: Negative; no easy bruising, bleeding Endocrine: Negative; no heat/cold intolerance; no diabetes Neuro: Negative; no changes in balance, headaches Skin: Negative; No rashes or skin lesions Psychiatric: Negative; No behavioral problems, depression Sleep: History of complex sleep apnea; currently not on therapy snoring, daytime sleepiness, hypersomnolence, bruxism, restless legs, hypnogognic hallucinations, no cataplexy Other comprehensive 14 point system review is negative.  PE BP 112/60   Pulse 66   Ht '5\' 7"'  (1.702 m)   Wt 167 lb (75.8 kg)   BMI 26.16 kg/m    Repeat blood pressure by me was 126/68.  Wt Readings from Last 3 Encounters:  10/25/17 167 lb (75.8 kg)  04/12/17 166 lb (75.3 kg)  04/27/16 168 lb 6.4 oz (76.4 kg)   General: Alert, oriented, no distress.  Skin: normal turgor, no rashes, warm and dry HEENT: Normocephalic, atraumatic. Pupils equal round and reactive to light; sclera anicteric; extraocular muscles intact;  Nose without nasal septal hypertrophy Mouth/Parynx benign; Mallinpatti scale 3 Neck: No JVD, no carotid bruits; normal carotid upstroke Lungs: clear to ausculatation and percussion; no wheezing or rales Chest wall: without tenderness to palpitation Heart: PMI not displaced, RRR, s1 s2 normal, 1/6 systolic murmur, no diastolic murmur, no rubs, gallops, thrills, or heaves Abdomen: soft, nontender; no hepatosplenomehaly, BS+; abdominal aorta nontender and not dilated by palpation. Back: no CVA tenderness Pulses 2+ Musculoskeletal: full range of motion, normal strength, no joint deformities Extremities: no clubbing cyanosis or edema, Homan's sign negative  Neurologic: grossly nonfocal; Cranial nerves grossly wnl Psychologic: Normal mood and affect   ECG (independently read by me): Normal sinus rhythm at 66 bpm.   Nonspecific T-wave changes.  QTc interval 396 ms.  May 2018 ECG (independently read by me): Normal sinus rhythm at 71 bpm with sinus arrhythmia.  Nonspecific T changes.  Normal intervals.  May 2017 ECG (independently read by me): Normal sinus rhythm at 68 bpm.  Nonspecific ST changes inferolaterally.  QTc interval 412 ms.  April 2016 ECG (independently read by me): Normal sinus rhythm at 80 bpm.  Normal intervals.  No significant ST segment changes  January 2015 ECG (independently read by me): Sinus rhythm 87 beats per minute. Complete left bundle-branch block. Nonspecific T changes.  LABS:  BMP Latest Ref Rng & Units 08/17/2014 01/03/2010  Glucose 70 - 99 mg/dL 119(H) 97  BUN 6 - 23 mg/dL 18 24(H)  Creatinine 0.50 - 1.35 mg/dL 1.16 1.44  Sodium 137 - 147 mEq/L 141 140  Potassium 3.7 - 5.3 mEq/L 4.0 3.8  Chloride 96 - 112 mEq/L 104 106  CO2 19 - 32 mEq/L 23 24  Calcium 8.4 - 10.5 mg/dL 9.3 9.5    Hepatic Function Latest Ref Rng & Units 01/03/2010  Total Protein 6.0 - 8.3 g/dL 6.9  Albumin 3.5 - 5.2 g/dL 3.9  AST 0 - 37 U/L 18  ALT 0 - 53 U/L 15  Alk Phosphatase 39 - 117 U/L 85  Total Bilirubin 0.3 - 1.2 mg/dL 0.5    No results found for: WBC   BNP No results found for: PROBNP  Lipid Panel  No results found for: CHOL   RADIOLOGY: No results found.  IMPRESSION:  1. CAD in native artery   2. Hyperlipidemia with target LDL less than 70   3. Medication management   4. OSA (obstructive sleep apnea)   5. Gastroesophageal reflux disease without esophagitis   6. Type 2 diabetes mellitus without complication, without long-term current use of insulin ([place])     ASSESSMENT AND PLAN: Bruce Gray is a 76 years old gentleman who is 32 years status post CABG surgery done by Dr. Glade Nurse in 1986. His last cardiac catheterization was done by Dr. Rex Kras in July 2010 which showed dense calcification of his native coronary arteries. All native vessels were occluded proximally.  There was a patent  sequential graft to  the diagonal and OM with a proximal tapering of 40%. The sequential graft to the AM and PDA was large caliber and widely patent. His LIMA graft to the LAD was small and patent.  With reference to his CAD, Bruce Gray remains angina free.  His blood pressure today is stable on his regimen consisting of amlodipine 5 mg, carb, nadolol 6.25 mg twice a day, furosemide 20 mg every other day in addition to his eyes Sr. by Butch Penny nitrate 30 mg daily.  Diabetic on metformin 500 mg daily and is tolerating this well.  He has been on atorvastatin 40 mg in addition to niacin 500 mg.  With his recent LDL being 75, and triglycerides 146, I have recommended that he discontinue niacin and in its place started acetamide 10 mg, which had another 20-25% LDL reduction.  When combined with statin.  He will continue the present dose of atorvastatin.  3 months.  Repeat laboratory will be obtained.  His GERD is controlled with omeprazole.  Although he has sleep apnea, he feels he is sleeping well, particularly since he has avoided tea which she was having significant amount previously and contributed to some of his poor sleep pattern.  I will see him in 6 months for reevaluation.   Time spent: 25 minutes Bruce Sine, MD, [facility]  10/27/2017 5:57 PM

## 2017-10-25 NOTE — Patient Instructions (Addendum)
Medication Instructions:  STOP Niacin  START Zetia 10 mg daily  Labwork: Please return for FASTING labs in 3 months (CMET, Lipid)  Follow-Up: Your physician wants you to follow-up in: 6 months with Dr. Claiborne Billings. You will receive a reminder letter in the mail two months in advance. If you don't receive a letter, please call our office to schedule the follow-up appointment.   Any Other Special Instructions Will Be Listed Below (If Applicable).     If you need a refill on your cardiac medications before your next appointment, please call your pharmacy.

## 2017-10-27 ENCOUNTER — Encounter: Payer: Self-pay | Admitting: Cardiovascular Disease

## 2017-10-30 ENCOUNTER — Telehealth: Payer: Self-pay | Admitting: Cardiovascular Disease

## 2017-10-30 NOTE — Telephone Encounter (Signed)
Returned call to pt he states that he cannot afford generic Zetia it is $77.50/90days he cannot affor this and would like an alternative. Please advise

## 2017-10-30 NOTE — Telephone Encounter (Signed)
New MEssage  Pt c/o medication issue:  1. Name of Medication: Ezetimibe   2. How are you currently taking this medication (dosage and times per day)? 10mg    3. Are you having a reaction (difficulty breathing--STAT)? No   4. What is your medication issue? Per pt medication is to expensive and would like to discuss an alternate medication .please call back to discuss

## 2017-11-03 NOTE — Telephone Encounter (Signed)
He cannot afford Zetia, consider increasing atorvastatin from 40 mg to 80 mg.

## 2017-11-04 MED ORDER — ATORVASTATIN CALCIUM 80 MG PO TABS: 80.0000 mg | ORAL_TABLET | Freq: Every day | ORAL | 1 refills | Status: DC

## 2017-12-20 DIAGNOSIS — E7800 Pure hypercholesterolemia, unspecified: Secondary | ICD-10-CM | POA: Diagnosis not present

## 2017-12-20 DIAGNOSIS — E119 Type 2 diabetes mellitus without complications: Secondary | ICD-10-CM | POA: Diagnosis not present

## 2017-12-20 DIAGNOSIS — Z7984 Long term (current) use of oral hypoglycemic drugs: Secondary | ICD-10-CM | POA: Diagnosis not present

## 2017-12-20 DIAGNOSIS — I10 Essential (primary) hypertension: Secondary | ICD-10-CM | POA: Diagnosis not present

## 2017-12-30 DIAGNOSIS — E119 Type 2 diabetes mellitus without complications: Secondary | ICD-10-CM | POA: Diagnosis not present

## 2017-12-30 DIAGNOSIS — L309 Dermatitis, unspecified: Secondary | ICD-10-CM | POA: Diagnosis not present

## 2017-12-30 DIAGNOSIS — I10 Essential (primary) hypertension: Secondary | ICD-10-CM | POA: Diagnosis not present

## 2017-12-30 DIAGNOSIS — E7800 Pure hypercholesterolemia, unspecified: Secondary | ICD-10-CM | POA: Diagnosis not present

## 2018-01-07 ENCOUNTER — Other Ambulatory Visit: Payer: Self-pay | Admitting: Cardiovascular Disease

## 2018-01-14 ENCOUNTER — Other Ambulatory Visit: Payer: Self-pay | Admitting: Cardiovascular Disease

## 2018-01-14 MED ORDER — CARVEDILOL 6.25 MG PO TABS: 6.2500 mg | ORAL_TABLET | Freq: Two times a day (BID) | ORAL | 3 refills | Status: DC

## 2018-01-14 MED ORDER — AMLODIPINE BESYLATE 5 MG PO TABS: 5.0000 mg | ORAL_TABLET | Freq: Every day | ORAL | 3 refills | Status: DC

## 2018-01-14 MED ORDER — ISOSORBIDE MONONITRATE ER 30 MG PO TB24: 15.0000 mg | ORAL_TABLET | Freq: Every day | ORAL | 3 refills | Status: DC

## 2018-01-14 NOTE — Telephone Encounter (Signed)
°*  STAT* If patient is at the pharmacy, call can be transferred to refill team.   1. Which medications need to be refilled? (please list name of each medication and dose if known) amLODipine (NORVASC) 5 MG tablet         carvedilol (COREG) 6.25 MG tablet        isosorbide mononitrate (IMDUR) 30 MG 24 hr tablet        2. Which pharmacy/location (including street and city if local pharmacy) is medication to be sent to?  [facility] mail order  3. Do they need a 30 day or 90 day supply?  [place]

## 2018-01-14 NOTE — Telephone Encounter (Signed)
Rx(s) sent to pharmacy electronically.  

## 2018-02-03 DIAGNOSIS — Z79899 Other long term (current) drug therapy: Secondary | ICD-10-CM | POA: Diagnosis not present

## 2018-02-03 DIAGNOSIS — I2510 Atherosclerotic heart disease of native coronary artery without angina pectoris: Secondary | ICD-10-CM | POA: Diagnosis not present

## 2018-02-03 DIAGNOSIS — E785 Hyperlipidemia, unspecified: Secondary | ICD-10-CM | POA: Diagnosis not present

## 2018-02-03 LAB — COMPREHENSIVE METABOLIC PANEL
ALK PHOS: 112 IU/L (ref 39–117)
ALT: 21 IU/L (ref 0–44)
AST: 24 IU/L (ref 0–40)
Albumin/Globulin Ratio: 1.9 (ref 1.2–2.2)
Albumin: 4.1 g/dL (ref 3.5–4.8)
BILIRUBIN TOTAL: 0.4 mg/dL (ref 0.0–1.2)
BUN/Creatinine Ratio: 18 (ref 10–24)
BUN: 22 mg/dL (ref 8–27)
CHLORIDE: 104 mmol/L (ref 96–106)
CO2: 20 mmol/L (ref 20–29)
Calcium: 9.3 mg/dL (ref 8.6–10.2)
Creatinine, Ser: 1.24 mg/dL (ref 0.76–1.27)
GFR calc Af Amer: 64 mL/min/{1.73_m2} (ref >59)
GFR calc non Af Amer: 55 mL/min/{1.73_m2} — ABNORMAL LOW (ref >59)
GLUCOSE: 93 mg/dL (ref 65–99)
Globulin, Total: 2.2 g/dL (ref 1.5–4.5)
POTASSIUM: 4.4 mmol/L (ref 3.5–5.2)
Sodium: 139 mmol/L (ref 134–144)
Total Protein: 6.3 g/dL (ref 6.0–8.5)

## 2018-02-03 LAB — LIPID PANEL
CHOL/HDL RATIO: 3.5 ratio (ref 0.0–5.0)
Cholesterol, Total: 124 mg/dL (ref 100–199)
HDL: 35 mg/dL — ABNORMAL LOW (ref >39)
LDL Calculated: 59 mg/dL (ref 0–99)
Triglycerides: 152 mg/dL — ABNORMAL HIGH (ref 0–149)
VLDL Cholesterol Cal: 30 mg/dL (ref 5–40)

## 2018-03-26 ENCOUNTER — Telehealth: Payer: Self-pay | Admitting: Cardiovascular Disease

## 2018-03-26 MED ORDER — OMEPRAZOLE 20 MG PO CPDR: 20.0000 mg | DELAYED_RELEASE_CAPSULE | Freq: Every day | ORAL | 3 refills | Status: DC

## 2018-03-26 NOTE — Telephone Encounter (Signed)
Rx request sent to pharmacy. Spoke to pt to make aware.

## 2018-03-26 NOTE — Telephone Encounter (Signed)
New Message:      *STAT* If patient is at the pharmacy, call can be transferred to refill team.   1. Which medications need to be refilled? (please list name of each medication and dose if known) omeprazole (PRILOSEC) 20 MG capsule  2. Which pharmacy/location (including street and city if local pharmacy) is medication to be sent to?[place], [place]  3. Do they need a 30 day or 90 day supply? [place]

## 2018-04-01 DIAGNOSIS — K625 Hemorrhage of anus and rectum: Secondary | ICD-10-CM | POA: Diagnosis not present

## 2018-04-01 DIAGNOSIS — K30 Functional dyspepsia: Secondary | ICD-10-CM | POA: Diagnosis not present

## 2018-04-10 DIAGNOSIS — H5210 Myopia, unspecified eye: Secondary | ICD-10-CM | POA: Diagnosis not present

## 2018-04-23 ENCOUNTER — Encounter: Payer: Self-pay | Admitting: Cardiovascular Disease

## 2018-04-25 ENCOUNTER — Encounter: Payer: Self-pay | Admitting: Cardiovascular Disease

## 2018-04-25 ENCOUNTER — Ambulatory Visit (INDEPENDENT_AMBULATORY_CARE_PROVIDER_SITE_OTHER): Payer: Medicare HMO | Admitting: Cardiovascular Disease

## 2018-04-25 VITALS — BP 120/68 | HR 79 | Ht 67.0 in | Wt 162.4 lb

## 2018-04-25 DIAGNOSIS — Z8719 Personal history of other diseases of the digestive system: Secondary | ICD-10-CM | POA: Diagnosis not present

## 2018-04-25 DIAGNOSIS — I2510 Atherosclerotic heart disease of native coronary artery without angina pectoris: Secondary | ICD-10-CM

## 2018-04-25 DIAGNOSIS — E119 Type 2 diabetes mellitus without complications: Secondary | ICD-10-CM

## 2018-04-25 DIAGNOSIS — Z951 Presence of aortocoronary bypass graft: Secondary | ICD-10-CM

## 2018-04-25 DIAGNOSIS — E785 Hyperlipidemia, unspecified: Secondary | ICD-10-CM

## 2018-04-25 DIAGNOSIS — G4733 Obstructive sleep apnea (adult) (pediatric): Secondary | ICD-10-CM | POA: Diagnosis not present

## 2018-04-25 NOTE — Progress Notes (Signed)
Patient ID: ANEL PUROHIT, male   DOB: Feb 07, 1941, 77 y.o.   MRN: [id number]     Primary M.D.: Dr. Harlene Ramus  HPI: KILO ESHELMAN is a 77 y.o. male who is a former patient of Dr. Rex Kras who  presents for a 6 month follow-up cardiology evaluation.  Mr. Tetzloff has known CAD and underwent CABG revascularization surgery in 1986 by Dr. Glade Nurse.  He has a  history of prostate CA treated with seed implantation, type 2 diabetes mellitus, mild renal insufficiency and also has a history of complex sleep apnea for which he has refused to be treated. There also is a history of gout.  Since his bypass surgery, he believes he has had 2 heart catheterizations by Dr. Rex Kras with in the 1990s and his last in 2010.  He was told that his grafts remained normal.  An echo Doppler study in January 2014 showed normal systolic function with ejection fraction of 55-60% with grade 1 diastolic dysfunction. There is mild primary hypertension with estimated PA pressure 34 mm. He had mild atrial dilatation.  His last nuclear perfusion study in January 2014 showed normal perfusion and function with apical thinning.  When saw him in May 2017, he was stable without recurrent anginal type symptomatology.  He sees Dr. Arnell Asal, for primary care and had laboratory  on 12/17/2016.  Hemoglobin A1c was 6.6, BUN 24 Cr1.4.  LFTs were normal.  Total cholesterol was 138, HDL 41, LDL 68, and triglycerides 145 with a non-HDL of 97.  He notes fatigue.  He has no interest in pursuing his previous diagnosis of sleep apnea.  He is a very strong family history for heart disease and for his brothers have died as a result of this, after he had initially undergone his CABG.   He underwent an echo Doppler study in May 2018 which showed an EF of 50-55%.  He had normal diastolic parameters.  There was mild MR.  His left atrium was mildly dilated.  He had laboratory done by his primary physician in July 2018.  Total cholesterol was  144, HDL 40, LDL 75, triglycerides 146.  Hemoglobin A1c 6.6.  He had normal LFTs.    Since I last saw him, he states that he has been sleeping well despite not under treatment for his sleep apnea.  He denies any chest pain or shortness of breath.  He remains active.  He has a history of hemorrhoids and had undergone hemorrhoidal surgery and had noticed some mild rectal bleeding.  He denies any palpitations.  He has not had any recent gout flares.  He presents for reevaluation.   Past Medical History:  Diagnosis Date  . CAD (coronary artery disease)   . Complex sleep apnea syndrome    does not use a cpap-cannot  . Diabetes mellitus ([place])    type 2  . Gout   . Heart disease   . High cholesterol   . HOH (hard of hearing)   . Hypertension   . Liver disease   . Mild renal insufficiency   . Prostate cancer ([place])    seed implantation    Past Surgical History:  Procedure Laterality Date  . CARDIAC CATHETERIZATION  86,2010  . CARDIOVASCULAR STRESS TEST  2008  . CHOLECYSTECTOMY  2011  . COLONOSCOPY    . CORONARY ARTERY BYPASS GRAFT  1986  . DOPPLER ECHOCARDIOGRAPHY     mild concentric LVH with mild grade 1 diastolic dysfunction with normal tissue doppler  parameters  . HEMORRHOID SURGERY  2007  . INSERTION PROSTATE RADIATION SEED  2013  . KNEE ARTHROSCOPY WITH MEDIAL MENISECTOMY Left 08/20/2014   Procedure: LEFT KNEE ARTHROSCOPY WITH DEBRIDEMENT/SHAVING (CHONDROPLASTY), MEDIAL MENISECTOMY, EXCISION OF PLICA;  Surgeon: Ninetta Lights, MD;  Location: [facility];  Service: Orthopedics;  Laterality: Left;  . TONSILLECTOMY      Allergies  Allergen Reactions  . Codeine Nausea And Vomiting  . Prevacid [Lansoprazole] Nausea And Vomiting    Current Outpatient Medications  Medication Sig Dispense Refill  . allopurinol (ZYLOPRIM) 100 MG tablet Take 200 mg by mouth daily.     Marland Kitchen amLODipine (NORVASC) 5 MG tablet Take 1 tablet (5 mg total) by mouth daily. 90 tablet 3  . aspirin  81 MG tablet Take 81 mg by mouth daily.    Marland Kitchen atorvastatin (LIPITOR) 80 MG tablet TAKE 1 TABLET EVERY DAY  AT  6PM (NEW DOSE) 90 tablet 2  . carvedilol (COREG) 6.25 MG tablet Take 1 tablet (6.25 mg total) by mouth 2 (two) times daily with a meal. 180 tablet 3  . docusate sodium (COLACE) 100 MG capsule Take 100 mg by mouth daily.    . furosemide (LASIX) 20 MG tablet Take 20 mg by mouth every other day.     . Glucosamine-Chondroitin (OSTEO BI-FLEX REGULAR STRENGTH PO) Take by mouth.    . isosorbide mononitrate (IMDUR) 30 MG 24 hr tablet Take 0.5 tablets (15 mg total) by mouth daily. 45 tablet 3  . metFORMIN (GLUCOPHAGE-XR) 500 MG 24 hr tablet Take 500 mg by mouth daily.    Marland Kitchen omeprazole (PRILOSEC) 20 MG capsule Take 1 capsule (20 mg total) by mouth daily. 90 capsule 3   No current facility-administered medications for this visit.     Social History   Socioeconomic History  . Marital status: Married    Spouse name: Not on file  . Number of children: 2  . Years of education: 76  . Highest education level: Not on file  Occupational History  . Occupation: retired  Scientific laboratory technician  . Financial resource strain: Not on file  . Food insecurity:    Worry: Not on file    Inability: Not on file  . Transportation needs:    Medical: Not on file    Non-medical: Not on file  Tobacco Use  . Smoking status: Former Smoker    Last attempt to quit: 07/03/1958    Years since quitting: 59.8  . Smokeless tobacco: Never Used  Substance and Sexual Activity  . Alcohol use: No    Comment: occasional  . Drug use: No  . Sexual activity: Not on file  Lifestyle  . Physical activity:    Days per week: Not on file    Minutes per session: Not on file  . Stress: Not on file  Relationships  . Social connections:    Talks on phone: Not on file    Gets together: Not on file    Attends religious service: Not on file    Active member of club or organization: Not on file    Attends meetings of clubs or  organizations: Not on file    Relationship status: Not on file  . Intimate partner violence:    Fear of current or ex partner: Not on file    Emotionally abused: Not on file    Physically abused: Not on file    Forced sexual activity: Not on file  Other Topics Concern  . Not  on file  Social History Narrative  . Not on file    Family History  Problem Relation Age of Onset  . Cancer - Other Mother        breast  . Stroke Father    ROS General: Negative; No fevers, chills, or night sweats;  HEENT: Positive for cataracts; No changes  hearing, sinus congestion, difficulty swallowing Pulmonary: Negative; No cough, wheezing, shortness of breath, hemoptysis Cardiovascular: See history of present illness GI: Negative; No nausea, vomiting, diarrhea, or abdominal pain GU: Negative; No dysuria, hematuria, or difficulty voiding Musculoskeletal: Had left knee surgery by Dr. Percell Miller.  History of left hip bursitis. Hematologic/Oncology: Negative; no easy bruising, bleeding Endocrine: Negative; no heat/cold intolerance; no diabetes Neuro: Negative; no changes in balance, headaches Skin: Negative; No rashes or skin lesions Psychiatric: Negative; No behavioral problems, depression Sleep: History of complex sleep apnea; currently not on therapy snoring, daytime sleepiness, hypersomnolence, bruxism, restless legs, hypnogognic hallucinations, no cataplexy Other comprehensive 14 point system review is negative.   PE BP 120/68 (BP Location: Left Arm)   Pulse 79   Ht '5\' 7"'  (1.702 m)   Wt 162 lb 6.4 oz (73.7 kg)   BMI 25.44 kg/m    Repeat blood pressure by me was 128/70  Wt Readings from Last 3 Encounters:  04/25/18 162 lb 6.4 oz (73.7 kg)  10/25/17 167 lb (75.8 kg)  04/12/17 166 lb (75.3 kg)    General: Alert, oriented, no distress.  Skin: normal turgor, no rashes, warm and dry HEENT: Normocephalic, atraumatic. Pupils equal round and reactive to light; sclera anicteric; extraocular  muscles intact;  Nose without nasal septal hypertrophy Mouth/Parynx benign; Mallinpatti 3 Neck: No JVD, no carotid bruits; normal carotid upstroke Lungs: clear to ausculatation and percussion; no wheezing or rales Chest wall: without tenderness to palpitation Heart: PMI not displaced, RRR, s1 s2 normal, 1/6 systolic murmur, no diastolic murmur, no rubs, gallops, thrills, or heaves Abdomen: soft, nontender; no hepatosplenomehaly, BS+; abdominal aorta nontender and not dilated by palpation. Back: no CVA tenderness Pulses 2+ Musculoskeletal: full range of motion, normal strength, no joint deformities Extremities: no clubbing cyanosis or edema, Homan's sign negative  Neurologic: grossly nonfocal; Cranial nerves grossly wnl Psychologic: Normal mood and affect   ECG (independently read by me): Normal sinus rhythm at 79 bpm.  Nonspecific T changes.  Normal intervals.  No ectopy.  November 2018 ECG (independently read by me): Normal sinus rhythm at 66 bpm.  Nonspecific T-wave changes.  QTc interval 396 ms.  May 2018 ECG (independently read by me): Normal sinus rhythm at 71 bpm with sinus arrhythmia.  Nonspecific T changes.  Normal intervals.  May 2017 ECG (independently read by me): Normal sinus rhythm at 68 bpm.  Nonspecific ST changes inferolaterally.  QTc interval 412 ms.  April 2016 ECG (independently read by me): Normal sinus rhythm at 80 bpm.  Normal intervals.  No significant ST segment changes  January 2015 ECG (independently read by me): Sinus rhythm 87 beats per minute. Complete left bundle-branch block. Nonspecific T changes.  LABS:  BMP Latest Ref Rng & Units 02/03/2018 08/17/2014 01/03/2010  Glucose 65 - 99 mg/dL 93 119(H) 97  BUN 8 - 27 mg/dL 22 18 24(H)  Creatinine 0.76 - 1.27 mg/dL 1.24 1.16 1.44  BUN/Creat Ratio 10 - 24 18 - -  Sodium 134 - 144 mmol/L 139 141 140  Potassium 3.5 - 5.2 mmol/L 4.4 4.0 3.8  Chloride 96 - 106 mmol/L 104 104 106  CO2 20 -  29 mmol/L '20 23 24    ' Calcium 8.6 - 10.2 mg/dL 9.3 9.3 9.5    Hepatic Function Latest Ref Rng & Units 02/03/2018 01/03/2010  Total Protein 6.0 - 8.5 g/dL 6.3 6.9  Albumin 3.5 - 4.8 g/dL 4.1 3.9  AST 0 - 40 IU/L 24 18  ALT 0 - 44 IU/L 21 15  Alk Phosphatase 39 - 117 IU/L 112 85  Total Bilirubin 0.0 - 1.2 mg/dL 0.4 0.5    No results found for: WBC   BNP No results found for: PROBNP  Lipid Panel     Component Value Date/Time   CHOL 124 02/03/2018 0843     RADIOLOGY: No results found.  IMPRESSION:  1. CAD in native artery   2. Hx of CABG   3. Hyperlipidemia with target LDL less than 70   4. OSA (obstructive sleep apnea)   5. Type 2 diabetes mellitus without complication, without long-term current use of insulin ([place])   6. History of hemorrhoids     ASSESSMENT AND PLAN: Mr. Afnan Emberton is a 77 years old gentleman who is 33 years status post CABG surgery done by Dr. Glade Nurse in 1986. His last cardiac catheterization was done by Dr. Rex Kras in July 2010 which showed dense calcification of his native coronary arteries. All native vessels were occluded proximally. There was a patent sequential graft to the diagonal and OM with a proximal tapering of 40%. The sequential graft to the AM and PDA was large caliber and widely patent. His LIMA graft to the LAD was small and patent.  He is free of anginal symptomatology.  He denies any change in exertional shortness of breath.  He has remained active.  His blood pressure and angina are controlled on amlodipine 5 mg, carvedilol 6.25 g twice a day, isosorbide 15 mg, and he also takes furosemide 20 g every other day.  He denies recent leg swelling.  He is diabetic on metformin 500 mg daily.  He has a history of hemorrhoids.  Recently, he started to take Preparation H with improvement.  He has a history of hemorrhoidal surgery.  He is on atorvastatin 80 mg for hyperlipidemia.  For every 25 2019.  Lipids were good with total cholesterol 124, LDL 59.  Triglycerides  were minimally increased at 152, and HDL was mildly low at 35. ,  Although he has sleep apnea, he states he has sleeping well.  CT head been drinking a fair amount of tea which he discontinued and feels that his sleep pattern has improved.  He denies waking up gasping for breath.  He believes his sleep is restorative.  I will see him in one year for reevaluation unless problems develop.   Time spent: 25 minutes Troy Sine, MD, [facility]  04/27/2018 4:02 PM

## 2018-04-25 NOTE — Patient Instructions (Signed)

## 2018-04-27 ENCOUNTER — Encounter: Payer: Self-pay | Admitting: Cardiovascular Disease

## 2018-05-12 DIAGNOSIS — H43811 Vitreous degeneration, right eye: Secondary | ICD-10-CM | POA: Diagnosis not present

## 2018-05-14 DIAGNOSIS — H4921 Sixth [abducent] nerve palsy, right eye: Secondary | ICD-10-CM | POA: Diagnosis not present

## 2018-05-14 DIAGNOSIS — H2513 Age-related nuclear cataract, bilateral: Secondary | ICD-10-CM | POA: Diagnosis not present

## 2018-05-27 DIAGNOSIS — H4921 Sixth [abducent] nerve palsy, right eye: Secondary | ICD-10-CM | POA: Diagnosis not present

## 2018-06-25 DIAGNOSIS — Z8546 Personal history of malignant neoplasm of prostate: Secondary | ICD-10-CM | POA: Diagnosis not present

## 2018-06-26 DIAGNOSIS — I10 Essential (primary) hypertension: Secondary | ICD-10-CM | POA: Diagnosis not present

## 2018-06-26 DIAGNOSIS — E7800 Pure hypercholesterolemia, unspecified: Secondary | ICD-10-CM | POA: Diagnosis not present

## 2018-06-26 DIAGNOSIS — E119 Type 2 diabetes mellitus without complications: Secondary | ICD-10-CM | POA: Diagnosis not present

## 2018-06-30 DIAGNOSIS — I10 Essential (primary) hypertension: Secondary | ICD-10-CM | POA: Diagnosis not present

## 2018-06-30 DIAGNOSIS — K219 Gastro-esophageal reflux disease without esophagitis: Secondary | ICD-10-CM | POA: Diagnosis not present

## 2018-06-30 DIAGNOSIS — E119 Type 2 diabetes mellitus without complications: Secondary | ICD-10-CM | POA: Diagnosis not present

## 2018-06-30 DIAGNOSIS — E7800 Pure hypercholesterolemia, unspecified: Secondary | ICD-10-CM | POA: Diagnosis not present

## 2018-06-30 DIAGNOSIS — I2510 Atherosclerotic heart disease of native coronary artery without angina pectoris: Secondary | ICD-10-CM | POA: Diagnosis not present

## 2018-07-02 DIAGNOSIS — N401 Enlarged prostate with lower urinary tract symptoms: Secondary | ICD-10-CM | POA: Diagnosis not present

## 2018-07-02 DIAGNOSIS — R3912 Poor urinary stream: Secondary | ICD-10-CM | POA: Diagnosis not present

## 2018-07-02 DIAGNOSIS — C61 Malignant neoplasm of prostate: Secondary | ICD-10-CM | POA: Diagnosis not present

## 2018-07-28 DIAGNOSIS — H2513 Age-related nuclear cataract, bilateral: Secondary | ICD-10-CM | POA: Diagnosis not present

## 2018-07-28 DIAGNOSIS — H4921 Sixth [abducent] nerve palsy, right eye: Secondary | ICD-10-CM | POA: Diagnosis not present

## 2018-10-14 DIAGNOSIS — R05 Cough: Secondary | ICD-10-CM | POA: Diagnosis not present

## 2018-12-11 ENCOUNTER — Telehealth: Payer: Self-pay | Admitting: Cardiovascular Disease

## 2018-12-11 ENCOUNTER — Other Ambulatory Visit: Payer: Self-pay

## 2018-12-11 MED ORDER — AMLODIPINE BESYLATE 5 MG PO TABS: 5.0000 mg | ORAL_TABLET | Freq: Every day | ORAL | 1 refills | Status: DC

## 2018-12-11 MED ORDER — ATORVASTATIN CALCIUM 80 MG PO TABS: ORAL_TABLET | ORAL | 1 refills | Status: DC

## 2018-12-11 MED ORDER — CARVEDILOL 6.25 MG PO TABS: 6.2500 mg | ORAL_TABLET | Freq: Two times a day (BID) | ORAL | 1 refills | Status: DC

## 2018-12-11 NOTE — Telephone Encounter (Signed)
Medications refilled

## 2018-12-11 NOTE — Telephone Encounter (Signed)
New message      *STAT* If patient is at the pharmacy, call can be transferred to refill team.   1. Which medications need to be refilled? (please list name of each medication and dose if known) amLODipine (NORVASC) 5 MG tablet, atorvastatin (LIPITOR) 80 MG tablet, carvedilol (COREG) 6.25 MG tablet ,  2. Which pharmacy/location (including street and city if local pharmacy) is medication to be sent to?[facility]  3. Do they need a 30 day or 90 day supply?[place]

## 2019-01-01 DIAGNOSIS — E7800 Pure hypercholesterolemia, unspecified: Secondary | ICD-10-CM | POA: Diagnosis not present

## 2019-01-01 DIAGNOSIS — I10 Essential (primary) hypertension: Secondary | ICD-10-CM | POA: Diagnosis not present

## 2019-01-01 DIAGNOSIS — E119 Type 2 diabetes mellitus without complications: Secondary | ICD-10-CM | POA: Diagnosis not present

## 2019-01-01 DIAGNOSIS — Z7984 Long term (current) use of oral hypoglycemic drugs: Secondary | ICD-10-CM | POA: Diagnosis not present

## 2019-01-05 DIAGNOSIS — I10 Essential (primary) hypertension: Secondary | ICD-10-CM | POA: Diagnosis not present

## 2019-01-05 DIAGNOSIS — E7800 Pure hypercholesterolemia, unspecified: Secondary | ICD-10-CM | POA: Diagnosis not present

## 2019-01-05 DIAGNOSIS — Z7984 Long term (current) use of oral hypoglycemic drugs: Secondary | ICD-10-CM | POA: Diagnosis not present

## 2019-01-05 DIAGNOSIS — E1169 Type 2 diabetes mellitus with other specified complication: Secondary | ICD-10-CM | POA: Diagnosis not present

## 2019-01-05 DIAGNOSIS — Z0000 Encounter for general adult medical examination without abnormal findings: Secondary | ICD-10-CM | POA: Diagnosis not present

## 2019-01-05 DIAGNOSIS — I2510 Atherosclerotic heart disease of native coronary artery without angina pectoris: Secondary | ICD-10-CM | POA: Diagnosis not present

## 2019-01-05 DIAGNOSIS — Z23 Encounter for immunization: Secondary | ICD-10-CM | POA: Diagnosis not present

## 2019-01-05 DIAGNOSIS — K219 Gastro-esophageal reflux disease without esophagitis: Secondary | ICD-10-CM | POA: Diagnosis not present

## 2019-01-06 ENCOUNTER — Other Ambulatory Visit: Payer: Self-pay | Admitting: Cardiovascular Disease

## 2019-02-01 DIAGNOSIS — J069 Acute upper respiratory infection, unspecified: Secondary | ICD-10-CM | POA: Diagnosis not present

## 2019-02-01 DIAGNOSIS — E1169 Type 2 diabetes mellitus with other specified complication: Secondary | ICD-10-CM | POA: Diagnosis not present

## 2019-04-10 ENCOUNTER — Other Ambulatory Visit: Payer: Self-pay | Admitting: Cardiovascular Disease

## 2019-04-11 NOTE — Telephone Encounter (Signed)
Isosorbide MN 30 mg refilled. 

## 2019-04-27 ENCOUNTER — Other Ambulatory Visit: Payer: Self-pay | Admitting: Cardiovascular Disease

## 2019-05-18 DIAGNOSIS — I10 Essential (primary) hypertension: Secondary | ICD-10-CM | POA: Diagnosis not present

## 2019-05-18 DIAGNOSIS — L820 Inflamed seborrheic keratosis: Secondary | ICD-10-CM | POA: Diagnosis not present

## 2019-05-18 DIAGNOSIS — L812 Freckles: Secondary | ICD-10-CM | POA: Diagnosis not present

## 2019-05-18 DIAGNOSIS — N189 Chronic kidney disease, unspecified: Secondary | ICD-10-CM | POA: Diagnosis not present

## 2019-05-18 DIAGNOSIS — D225 Melanocytic nevi of trunk: Secondary | ICD-10-CM | POA: Diagnosis not present

## 2019-05-18 DIAGNOSIS — R5383 Other fatigue: Secondary | ICD-10-CM | POA: Diagnosis not present

## 2019-05-18 DIAGNOSIS — I2510 Atherosclerotic heart disease of native coronary artery without angina pectoris: Secondary | ICD-10-CM | POA: Diagnosis not present

## 2019-05-18 DIAGNOSIS — L821 Other seborrheic keratosis: Secondary | ICD-10-CM | POA: Diagnosis not present

## 2019-05-18 DIAGNOSIS — E7800 Pure hypercholesterolemia, unspecified: Secondary | ICD-10-CM | POA: Diagnosis not present

## 2019-05-18 DIAGNOSIS — G4730 Sleep apnea, unspecified: Secondary | ICD-10-CM | POA: Diagnosis not present

## 2019-05-19 DIAGNOSIS — R5383 Other fatigue: Secondary | ICD-10-CM | POA: Diagnosis not present

## 2019-05-19 DIAGNOSIS — G4730 Sleep apnea, unspecified: Secondary | ICD-10-CM | POA: Diagnosis not present

## 2019-05-19 DIAGNOSIS — I2510 Atherosclerotic heart disease of native coronary artery without angina pectoris: Secondary | ICD-10-CM | POA: Diagnosis not present

## 2019-05-19 DIAGNOSIS — I10 Essential (primary) hypertension: Secondary | ICD-10-CM | POA: Diagnosis not present

## 2019-05-19 DIAGNOSIS — N189 Chronic kidney disease, unspecified: Secondary | ICD-10-CM | POA: Diagnosis not present

## 2019-05-19 DIAGNOSIS — E7800 Pure hypercholesterolemia, unspecified: Secondary | ICD-10-CM | POA: Diagnosis not present

## 2019-05-24 NOTE — Progress Notes (Addendum)
Cardiology Office Note   Date:  05/25/2019   ID:  Mads, Borgmeyer [birth year], MRN [id number]  PCP:  Aretta Nip, MD  Cardiologist:  Shelva Majestic, MD EP: None  Chief Complaint  Patient presents with  . Follow-up    fatigue   `    History of Present Illness: Bruce Gray is a 78 y.o. male with a PMH of CAD s/p CABG in 1986, HTN, HLD, OSA not on CPAP, CKD stage 3, who presents for follow-up of his CAD.  He was last evaluated by cardiology at an outpatient visit with Dr. Claiborne Billings 04/2018, at which time he was without cardiac complaints or recent angina. No medication changes occurred and he was recommended to follow-up in 1 year. His last ischemic evaluation was a LHC in 2010 which showed occluded native vessels and patent SVG to diagonal and OM with 40% proximal stenosis, SVG to PDA, and LIMA to LAD. His last echocardiogram 04/2017 showed EF 46-50%, normal diastolic LV function, and no significant valvular abnormalities.   He presents today for follow-up of his CAD. We spend the majority of the visit talking about his work history and long working days along with his bout of depression. He states he is very fatigued and is fatigued with exertion. We discussed his untreated sleep apnea, but he is adamant that he does not want to do the test again and the equipment was too expensive. I encouraged him to speak with Dr. Claiborne Billings about the changes in insurance coverage and new equipment since he was tested in 2009. His PCP has recently drawn labs. Hb was 11 and TSH was elevated at 6.4. I will defer to PCP for treatment of his mild anemia and for further management of TSH.    Past Medical History:  Diagnosis Date  . CAD (coronary artery disease)   . Complex sleep apnea syndrome    does not use a cpap-cannot  . Diabetes mellitus ([place])    type 2  . Gout   . Heart disease   . High cholesterol   . HOH (hard of hearing)   . Hypertension   . Liver disease   . Mild renal insufficiency    . Prostate cancer ([place])    seed implantation    Past Surgical History:  Procedure Laterality Date  . CARDIAC CATHETERIZATION  86,2010  . CARDIOVASCULAR STRESS TEST  2008  . CHOLECYSTECTOMY  2011  . COLONOSCOPY    . CORONARY ARTERY BYPASS GRAFT  1986  . DOPPLER ECHOCARDIOGRAPHY     mild concentric LVH with mild grade 1 diastolic dysfunction with normal tissue doppler parameters  . HEMORRHOID SURGERY  2007  . INSERTION PROSTATE RADIATION SEED  2013  . KNEE ARTHROSCOPY WITH MEDIAL MENISECTOMY Left 08/20/2014   Procedure: LEFT KNEE ARTHROSCOPY WITH DEBRIDEMENT/SHAVING (CHONDROPLASTY), MEDIAL MENISECTOMY, EXCISION OF PLICA;  Surgeon: Ninetta Lights, MD;  Location: [place];  Service: Orthopedics;  Laterality: Left;  . TONSILLECTOMY       Current Outpatient Medications  Medication Sig Dispense Refill  . allopurinol (ZYLOPRIM) 100 MG tablet Take 200 mg by mouth daily.     Marland Kitchen amLODipine (NORVASC) 5 MG tablet TAKE 1 TABLET EVERY DAY 90 tablet 1  . aspirin 81 MG tablet Take 81 mg by mouth daily.    Marland Kitchen atorvastatin (LIPITOR) 80 MG tablet TAKE 1 TABLET EVERY DAY  AT  6PM (NEW DOSE) 90 tablet 1  . carvedilol (COREG) 6.25 MG tablet TAKE  1 TABLET TWICE DAILY WITH MEALS 180 tablet 1  . docusate sodium (COLACE) 100 MG capsule Take 100 mg by mouth daily.    . furosemide (LASIX) 20 MG tablet Take 20 mg by mouth every other day.     . isosorbide mononitrate (IMDUR) 30 MG 24 hr tablet TAKE 1/2 TABLET EVERY DAY, NEED APPOINTMENT FOR FUTURE REFILLS 45 tablet 0  . metFORMIN (GLUCOPHAGE-XR) 500 MG 24 hr tablet Take 500 mg by mouth daily.    Marland Kitchen omeprazole (PRILOSEC) 20 MG capsule Take 1 capsule (20 mg total) by mouth daily. 90 capsule 3   No current facility-administered medications for this visit.     Allergies:   Codeine and Prevacid [lansoprazole]    Social History:  The patient  reports that he quit smoking about 60 years ago. He has never used smokeless tobacco. He reports that he  does not drink alcohol or use drugs.   Family History:  The patient's family history includes Cancer - Other in his mother; Stroke in his father.    ROS:  Please see the history of present illness.   Otherwise, review of systems are positive for none.   All other systems are reviewed and negative.    PHYSICAL EXAM: VS:  BP (!) 150/82   Pulse 66   Temp (!) 97.2 F (36.2 C)   Ht 5\' 5"  (1.651 m)   Wt 158 lb 3.2 oz (71.8 kg)   SpO2 98%   BMI 26.33 kg/m  , BMI Body mass index is 26.33 kg/m. GEN: Well nourished, well developed, in no acute distress HEENT: normal Neck: no JVD, carotid bruits, or masses Cardiac: RRR; no murmurs, rubs, or gallops,no edema  Respiratory:  clear to auscultation bilaterally, normal work of breathing GI: soft, nontender, nondistended, + BS MS: no deformity or atrophy Skin: warm and dry, no rash Neuro:  Strength and sensation are intact Psych: euthymic mood, full affect   EKG:  EKG is ordered today. The ekg ordered today demonstrates sinus with heart rate 65, nonspecific ST changes stable from prior    Recent Labs: No results found for requested labs within last 8760 hours.    Lipid Panel    Component Value Date/Time   CHOL 124 02/03/2018 0843   TRIG 152 (H) 02/03/2018 0843   HDL 35 (L) 02/03/2018 0843   CHOLHDL 3.5 02/03/2018 0843   LDLCALC 59 02/03/2018 0843      Wt Readings from Last 3 Encounters:  05/25/19 158 lb 3.2 oz (71.8 kg)  04/25/18 162 lb 6.4 oz (73.7 kg)  10/25/17 167 lb (75.8 kg)      Other studies Reviewed: Additional studies/ records that were reviewed today include: stress test 2014, echo 2018    ASSESSMENT AND PLAN:  1. CAD s/p CABG: He is not describing classic anginal synptoms, but is describing fatigue. It is difficult to know if this is a new or old problem. We discussed repeating a stress test and echocardiogram, but he defers both of these. He will talk to his wife and let me know if he wants to proceed.     2. HTN Well-controlled at home. No changes in medications.    3. HLD: LDL 59 01/2018 - Continue statin, no change   4. OSA: Not on CPAP but reports good sleep. I have encouraged him to speak with Dr. Claiborne Billings about new treatment options and how this would be good for his overall health. He will think about it. I will have him  back to see Dr. Claiborne Billings in 4 months.    5. Fatigue  6. ? Hypothyroidism 7. Anemia  - we discussed how the above along with untreated sleep apnea can be contributing to his fatigue. He will let me know if he wants to proceed with stress test.    Current medicines are reviewed at length with the patient today.  The patient does not have concerns regarding medicines.  The following changes have been made:  no change  Labs/ tests ordered today include: none  Orders Placed This Encounter  Procedures  . EKG 12-Lead     Disposition:   FU with  in 4 month  Signed, Ledora Bottcher, [place]  05/25/2019 4:14 PM

## 2019-05-25 ENCOUNTER — Encounter: Payer: Self-pay | Admitting: Physician Assistant

## 2019-05-25 ENCOUNTER — Other Ambulatory Visit: Payer: Self-pay

## 2019-05-25 ENCOUNTER — Ambulatory Visit (INDEPENDENT_AMBULATORY_CARE_PROVIDER_SITE_OTHER): Payer: Medicare HMO | Admitting: Physician Assistant

## 2019-05-25 VITALS — BP 150/82 | HR 66 | Temp 97.2°F | Ht 65.0 in | Wt 158.2 lb

## 2019-05-25 DIAGNOSIS — G4733 Obstructive sleep apnea (adult) (pediatric): Secondary | ICD-10-CM

## 2019-05-25 DIAGNOSIS — R7989 Other specified abnormal findings of blood chemistry: Secondary | ICD-10-CM | POA: Diagnosis not present

## 2019-05-25 DIAGNOSIS — D649 Anemia, unspecified: Secondary | ICD-10-CM | POA: Diagnosis not present

## 2019-05-25 DIAGNOSIS — I10 Essential (primary) hypertension: Secondary | ICD-10-CM

## 2019-05-25 DIAGNOSIS — I2510 Atherosclerotic heart disease of native coronary artery without angina pectoris: Secondary | ICD-10-CM

## 2019-05-25 DIAGNOSIS — E785 Hyperlipidemia, unspecified: Secondary | ICD-10-CM

## 2019-05-25 NOTE — Patient Instructions (Signed)
Medication Instructions:  Your physician recommends that you continue on your current medications as directed. Please refer to the Current Medication list given to you today.  If you need a refill on your cardiac medications before your next appointment, please call your pharmacy.    Follow-Up: At [facility], you and your health needs are our priority.  As part of our continuing mission to provide you with exceptional heart care, we have created designated Provider Care Teams.  These Care Teams include your primary Cardiologist (physician) and Advanced Practice Providers (APPs -  Physician Assistants and Nurse Practitioners) who all work together to provide you with the care you need, when you need it. . You have been scheduled for a follow-up appointment with Dr. Claiborne Billings on Thursday, 09/03/19 at 10:00 AM as an IN OFFICE Visit.  Any Other Special Instructions Will Be Listed Below (If Applicable). None

## 2019-06-11 DIAGNOSIS — H5210 Myopia, unspecified eye: Secondary | ICD-10-CM | POA: Diagnosis not present

## 2019-06-11 DIAGNOSIS — H35361 Drusen (degenerative) of macula, right eye: Secondary | ICD-10-CM | POA: Diagnosis not present

## 2019-07-03 DIAGNOSIS — C61 Malignant neoplasm of prostate: Secondary | ICD-10-CM | POA: Diagnosis not present

## 2019-07-06 DIAGNOSIS — D649 Anemia, unspecified: Secondary | ICD-10-CM | POA: Diagnosis not present

## 2019-07-06 DIAGNOSIS — R946 Abnormal results of thyroid function studies: Secondary | ICD-10-CM | POA: Diagnosis not present

## 2019-07-07 ENCOUNTER — Other Ambulatory Visit: Payer: Self-pay | Admitting: Cardiovascular Disease

## 2019-07-10 DIAGNOSIS — C61 Malignant neoplasm of prostate: Secondary | ICD-10-CM | POA: Diagnosis not present

## 2019-07-10 DIAGNOSIS — R3912 Poor urinary stream: Secondary | ICD-10-CM | POA: Diagnosis not present

## 2019-07-10 DIAGNOSIS — N401 Enlarged prostate with lower urinary tract symptoms: Secondary | ICD-10-CM | POA: Diagnosis not present

## 2019-07-14 DIAGNOSIS — H18413 Arcus senilis, bilateral: Secondary | ICD-10-CM | POA: Diagnosis not present

## 2019-07-14 DIAGNOSIS — H25013 Cortical age-related cataract, bilateral: Secondary | ICD-10-CM | POA: Diagnosis not present

## 2019-07-14 DIAGNOSIS — H25043 Posterior subcapsular polar age-related cataract, bilateral: Secondary | ICD-10-CM | POA: Diagnosis not present

## 2019-07-14 DIAGNOSIS — H2511 Age-related nuclear cataract, right eye: Secondary | ICD-10-CM | POA: Diagnosis not present

## 2019-07-14 DIAGNOSIS — H2513 Age-related nuclear cataract, bilateral: Secondary | ICD-10-CM | POA: Diagnosis not present

## 2019-07-14 DIAGNOSIS — H353131 Nonexudative age-related macular degeneration, bilateral, early dry stage: Secondary | ICD-10-CM | POA: Diagnosis not present

## 2019-07-17 DIAGNOSIS — H2512 Age-related nuclear cataract, left eye: Secondary | ICD-10-CM | POA: Diagnosis not present

## 2019-07-17 DIAGNOSIS — H2511 Age-related nuclear cataract, right eye: Secondary | ICD-10-CM | POA: Diagnosis not present

## 2019-07-22 DIAGNOSIS — N5082 Scrotal pain: Secondary | ICD-10-CM | POA: Diagnosis not present

## 2019-07-24 DIAGNOSIS — Z1211 Encounter for screening for malignant neoplasm of colon: Secondary | ICD-10-CM | POA: Diagnosis not present

## 2019-07-31 DIAGNOSIS — H2512 Age-related nuclear cataract, left eye: Secondary | ICD-10-CM | POA: Diagnosis not present

## 2019-08-07 DIAGNOSIS — H2512 Age-related nuclear cataract, left eye: Secondary | ICD-10-CM | POA: Diagnosis not present

## 2019-08-28 DIAGNOSIS — Z0100 Encounter for examination of eyes and vision without abnormal findings: Secondary | ICD-10-CM | POA: Diagnosis not present

## 2019-09-03 ENCOUNTER — Ambulatory Visit: Payer: Medicare HMO | Admitting: Cardiovascular Disease

## 2019-09-21 DIAGNOSIS — R946 Abnormal results of thyroid function studies: Secondary | ICD-10-CM | POA: Diagnosis not present

## 2019-09-21 DIAGNOSIS — Z1211 Encounter for screening for malignant neoplasm of colon: Secondary | ICD-10-CM | POA: Diagnosis not present

## 2019-09-21 DIAGNOSIS — D649 Anemia, unspecified: Secondary | ICD-10-CM | POA: Diagnosis not present

## 2019-09-29 DIAGNOSIS — R6881 Early satiety: Secondary | ICD-10-CM | POA: Diagnosis not present

## 2019-09-29 DIAGNOSIS — R634 Abnormal weight loss: Secondary | ICD-10-CM | POA: Diagnosis not present

## 2019-10-16 DIAGNOSIS — D72829 Elevated white blood cell count, unspecified: Secondary | ICD-10-CM | POA: Diagnosis not present

## 2019-10-27 DIAGNOSIS — Z1159 Encounter for screening for other viral diseases: Secondary | ICD-10-CM | POA: Diagnosis not present

## 2019-10-30 DIAGNOSIS — R634 Abnormal weight loss: Secondary | ICD-10-CM | POA: Diagnosis not present

## 2019-10-30 DIAGNOSIS — K5730 Diverticulosis of large intestine without perforation or abscess without bleeding: Secondary | ICD-10-CM | POA: Diagnosis not present

## 2019-10-30 DIAGNOSIS — R197 Diarrhea, unspecified: Secondary | ICD-10-CM | POA: Diagnosis not present

## 2019-10-30 DIAGNOSIS — K3189 Other diseases of stomach and duodenum: Secondary | ICD-10-CM | POA: Diagnosis not present

## 2019-10-30 DIAGNOSIS — R6881 Early satiety: Secondary | ICD-10-CM | POA: Diagnosis not present

## 2019-10-30 DIAGNOSIS — K2930 Chronic superficial gastritis without bleeding: Secondary | ICD-10-CM | POA: Diagnosis not present

## 2019-11-04 DIAGNOSIS — K2930 Chronic superficial gastritis without bleeding: Secondary | ICD-10-CM | POA: Diagnosis not present

## 2019-11-27 DIAGNOSIS — H269 Unspecified cataract: Secondary | ICD-10-CM | POA: Diagnosis not present

## 2019-11-27 DIAGNOSIS — Z8546 Personal history of malignant neoplasm of prostate: Secondary | ICD-10-CM | POA: Diagnosis not present

## 2019-11-27 DIAGNOSIS — I10 Essential (primary) hypertension: Secondary | ICD-10-CM | POA: Diagnosis not present

## 2019-11-27 DIAGNOSIS — N189 Chronic kidney disease, unspecified: Secondary | ICD-10-CM | POA: Diagnosis not present

## 2019-11-27 DIAGNOSIS — I2510 Atherosclerotic heart disease of native coronary artery without angina pectoris: Secondary | ICD-10-CM | POA: Diagnosis not present

## 2019-11-27 DIAGNOSIS — E7800 Pure hypercholesterolemia, unspecified: Secondary | ICD-10-CM | POA: Diagnosis not present

## 2019-11-27 DIAGNOSIS — E119 Type 2 diabetes mellitus without complications: Secondary | ICD-10-CM | POA: Diagnosis not present

## 2019-11-27 DIAGNOSIS — E1169 Type 2 diabetes mellitus with other specified complication: Secondary | ICD-10-CM | POA: Diagnosis not present

## 2019-11-27 DIAGNOSIS — E785 Hyperlipidemia, unspecified: Secondary | ICD-10-CM | POA: Diagnosis not present

## 2019-12-09 ENCOUNTER — Other Ambulatory Visit: Payer: Self-pay | Admitting: Cardiovascular Disease

## 2019-12-10 ENCOUNTER — Other Ambulatory Visit: Payer: Self-pay

## 2019-12-10 MED ORDER — ISOSORBIDE MONONITRATE ER 30 MG PO TB24: 15.0000 mg | ORAL_TABLET | Freq: Every day | ORAL | 0 refills | Status: DC

## 2019-12-16 ENCOUNTER — Other Ambulatory Visit: Payer: Self-pay | Admitting: *Deleted

## 2019-12-16 ENCOUNTER — Telehealth: Payer: Self-pay

## 2019-12-16 ENCOUNTER — Other Ambulatory Visit: Payer: Self-pay

## 2019-12-16 ENCOUNTER — Telehealth: Payer: Self-pay | Admitting: Cardiovascular Disease

## 2019-12-16 MED ORDER — CARVEDILOL 6.25 MG PO TABS: 6.2500 mg | ORAL_TABLET | Freq: Two times a day (BID) | ORAL | 0 refills | Status: DC

## 2019-12-16 MED ORDER — ATORVASTATIN CALCIUM 80 MG PO TABS: ORAL_TABLET | ORAL | 0 refills | Status: DC

## 2019-12-16 MED ORDER — AMLODIPINE BESYLATE 5 MG PO TABS: 5.0000 mg | ORAL_TABLET | Freq: Every day | ORAL | 0 refills | Status: DC

## 2019-12-16 MED ORDER — ISOSORBIDE MONONITRATE ER 30 MG PO TB24: 15.0000 mg | ORAL_TABLET | Freq: Every day | ORAL | 1 refills | Status: DC

## 2019-12-16 NOTE — Telephone Encounter (Signed)
Spoke with patients wife. Bruce Gray needs ov for future refills.

## 2019-12-16 NOTE — Telephone Encounter (Signed)
REFILLS SENT TO [facility] AS REQUESTED .Adonis Housekeeper

## 2019-12-16 NOTE — Telephone Encounter (Signed)
°*  STAT* If patient is at the pharmacy, call can be transferred to refill team.   1. Which medications need to be refilled? (please list name of each medication and dose if known)    isosorbide mononitrate (IMDUR) 30 MG 24 hr tablet  atorvastatin (LIPITOR) 80 MG tablet  amLODipine (NORVASC) 5 MG tablet   carvedilol (COREG) 6.25 MG tablet  2. Which pharmacy/location (including street and city if local pharmacy) is medication to be sent to? [place], [place]  3. Do they need a 30 day or 90 day supply? 90 days   Patient states he has 7 days worth of Isosorbide. Patient has scheduled an appointment on 12/17/19

## 2019-12-17 ENCOUNTER — Other Ambulatory Visit: Payer: Self-pay

## 2019-12-17 ENCOUNTER — Ambulatory Visit: Payer: Medicare HMO | Admitting: Cardiovascular Disease

## 2019-12-17 ENCOUNTER — Encounter: Payer: Self-pay | Admitting: Cardiovascular Disease

## 2019-12-17 VITALS — BP 133/76 | HR 63 | Temp 97.3°F | Ht 67.0 in | Wt 150.0 lb

## 2019-12-17 DIAGNOSIS — I10 Essential (primary) hypertension: Secondary | ICD-10-CM | POA: Diagnosis not present

## 2019-12-17 DIAGNOSIS — E785 Hyperlipidemia, unspecified: Secondary | ICD-10-CM

## 2019-12-17 DIAGNOSIS — K2970 Gastritis, unspecified, without bleeding: Secondary | ICD-10-CM | POA: Diagnosis not present

## 2019-12-17 DIAGNOSIS — Z951 Presence of aortocoronary bypass graft: Secondary | ICD-10-CM

## 2019-12-17 DIAGNOSIS — I2510 Atherosclerotic heart disease of native coronary artery without angina pectoris: Secondary | ICD-10-CM

## 2019-12-17 DIAGNOSIS — E119 Type 2 diabetes mellitus without complications: Secondary | ICD-10-CM | POA: Diagnosis not present

## 2019-12-17 NOTE — Progress Notes (Signed)
Patient ID: Bruce Gray, male   DOB: [birth year], 80 y.o.   MRN: [id number]     Primary M.D.: Dr. Harlene Ramus  HPI: Bruce Gray is a 79 y.o. male who is a former patient of Dr. Rex Kras who  presents for a 20 month follow-up cardiology evaluation.  Bruce Gray has known CAD and underwent CABG revascularization surgery in 1986 by Dr. Glade Nurse.  He has a  history of prostate CA treated with seed implantation, type 2 diabetes mellitus, mild renal insufficiency and also has a history of complex sleep apnea for which he has refused to be treated. There also is a history of gout.  Since his bypass surgery, he believes he has had 2 heart catheterizations by Dr. Rex Kras with in the 1990s and his last in 2010.  He was told that his grafts remained normal.  An echo Doppler study in January 2014 showed normal systolic function with ejection fraction of 55-60% with grade 1 diastolic dysfunction. There is mild primary hypertension with estimated PA pressure 34 mm. He had mild atrial dilatation.  His last nuclear perfusion study in January 2014 showed normal perfusion and function with apical thinning.  When saw him in May 2017, he was stable without recurrent anginal type symptomatology.  He sees Dr. Arnell Asal, for primary care and had laboratory  on 12/17/2016.  Hemoglobin A1c was 6.6, BUN 24 Cr1.4.  LFTs were normal.  Total cholesterol was 138, HDL 41, LDL 68, and triglycerides 145 with a non-HDL of 97.  He notes fatigue.  He has no interest in pursuing his previous diagnosis of sleep apnea.  He is a very strong family history for heart disease and for his brothers have died as a result of this, after he had initially undergone his CABG.   He underwent an echo Doppler study in May 2018 which showed an EF of 50-55%.  He had normal diastolic parameters.  There was mild MR.  His left atrium was mildly dilated.  He had laboratory done by his primary physician in July 2018.  Total cholesterol  was 144, HDL 40, LDL 75, triglycerides 146.  Hemoglobin A1c 6.6.  He had normal LFTs.    When I last saw him in May 2019 he was denied any anginal symptomatology and denied any change in exertional shortness of breath.  His blood pressure was stable on his regimen consisting of amlodipine 5 mg, carvedilol 6.25 mg twice a day, isosorbide 15 mg and furosemide 20 mg every other day.  LDL cholesterol 59.  He was sleeping well despite not under treatment for his sleep apnea.  He denies any chest pain or shortness of breath.  He remains active.  He has a history of hemorrhoids and had undergone hemorrhoidal surgery and had noticed some mild rectal bleeding.  He denies any palpitations.  He has not had any recent gout flares.    He was evaluated in June 2020 by Fabian Sharp, PA and remained cardiovascularly  stable.  He did have some fatigue..  Since his last evaluation, he denies chest pain PND orthopnea.  He has had an issue with urinary tract infection.  He also has had some thyroid issues as well as iron deficiency.  He underwent cataract surgery.  He did experience some discomfort in the lower abdomen which was felt to be due to a pinched nerve.  He is no longer taking aspirin secondary to a diagnosis of gastritis.  He continues to be on iron  supplementation.  He is on amlodipine 5 mg, carvedilol 6.25 mg twice a day in addition to his previous isosorbide 15 mg dose.  He presents for reevaluation.   Past Medical History:  Diagnosis Date  . CAD (coronary artery disease)   . Complex sleep apnea syndrome    does not use a cpap-cannot  . Diabetes mellitus ([place])    type 2  . Gout   . Heart disease   . High cholesterol   . HOH (hard of hearing)   . Hypertension   . Liver disease   . Mild renal insufficiency   . Prostate cancer ([place])    seed implantation    Past Surgical History:  Procedure Laterality Date  . CARDIAC CATHETERIZATION  86,2010  . CARDIOVASCULAR STRESS TEST  2008  . CHOLECYSTECTOMY   2011  . COLONOSCOPY    . CORONARY ARTERY BYPASS GRAFT  1986  . DOPPLER ECHOCARDIOGRAPHY     mild concentric LVH with mild grade 1 diastolic dysfunction with normal tissue doppler parameters  . HEMORRHOID SURGERY  2007  . INSERTION PROSTATE RADIATION SEED  2013  . KNEE ARTHROSCOPY WITH MEDIAL MENISECTOMY Left 08/20/2014   Procedure: LEFT KNEE ARTHROSCOPY WITH DEBRIDEMENT/SHAVING (CHONDROPLASTY), MEDIAL MENISECTOMY, EXCISION OF PLICA;  Surgeon: Ninetta Lights, MD;  Location: [facility];  Service: Orthopedics;  Laterality: Left;  . TONSILLECTOMY      Allergies  Allergen Reactions  . Codeine Nausea And Vomiting  . Prevacid [Lansoprazole] Nausea And Vomiting    Current Outpatient Medications  Medication Sig Dispense Refill  . allopurinol (ZYLOPRIM) 100 MG tablet Take 200 mg by mouth daily.     Marland Kitchen amLODipine (NORVASC) 5 MG tablet Take 1 tablet (5 mg total) by mouth daily. KEEP UPCOMING APPT 90 tablet 0  . atorvastatin (LIPITOR) 80 MG tablet TAKE 1 TABLET EVERY DAY  AT  6PM KEEP UPCOMING APPT 90 tablet 0  . carvedilol (COREG) 6.25 MG tablet Take 1 tablet (6.25 mg total) by mouth 2 (two) times daily with a meal. KEEP UPCOMING APPT 180 tablet 0  . docusate sodium (COLACE) 100 MG capsule Take 100 mg by mouth daily.    . FEROSUL 325 (65 Fe) MG tablet     . furosemide (LASIX) 20 MG tablet Take 20 mg by mouth every other day.     . isosorbide mononitrate (IMDUR) 30 MG 24 hr tablet Take 0.5 tablets (15 mg total) by mouth daily. 45 tablet 1  . metFORMIN (GLUCOPHAGE-XR) 500 MG 24 hr tablet Take 500 mg by mouth daily.    Marland Kitchen omeprazole (PRILOSEC) 20 MG capsule Take 1 capsule (20 mg total) by mouth daily. 90 capsule 3  . vitamin B-12 (CYANOCOBALAMIN) 500 MCG tablet Take 500 mcg by mouth daily.    Marland Kitchen aspirin 81 MG tablet Take 81 mg by mouth daily.     No current facility-administered medications for this visit.    Social History   Socioeconomic History  . Marital status: Married     Spouse name: Not on file  . Number of children: 2  . Years of education: 21  . Highest education level: Not on file  Occupational History  . Occupation: retired  Tobacco Use  . Smoking status: Former Smoker    Quit date: 07/03/1958    Years since quitting: 61.5  . Smokeless tobacco: Never Used  Substance and Sexual Activity  . Alcohol use: No    Comment: occasional  . Drug use: No  . Sexual activity:  Not on file  Other Topics Concern  . Not on file  Social History Narrative  . Not on file   Social Determinants of Health   Financial Resource Strain:   . Difficulty of Paying Living Expenses: Not on file  Food Insecurity:   . Worried About Charity fundraiser in the Last Year: Not on file  . Ran Out of Food in the Last Year: Not on file  Transportation Needs:   . Lack of Transportation (Medical): Not on file  . Lack of Transportation (Non-Medical): Not on file  Physical Activity:   . Days of Exercise per Week: Not on file  . Minutes of Exercise per Session: Not on file  Stress:   . Feeling of Stress : Not on file  Social Connections:   . Frequency of Communication with Friends and Family: Not on file  . Frequency of Social Gatherings with Friends and Family: Not on file  . Attends Religious Services: Not on file  . Active Member of Clubs or Organizations: Not on file  . Attends Archivist Meetings: Not on file  . Marital Status: Not on file  Intimate Partner Violence:   . Fear of Current or Ex-Partner: Not on file  . Emotionally Abused: Not on file  . Physically Abused: Not on file  . Sexually Abused: Not on file    Family History  Problem Relation Age of Onset  . Cancer - Other Mother        breast  . Stroke Father    ROS General: Negative; No fevers, chills, or night sweats;  HEENT: Positive for cataracts; No changes  hearing, sinus congestion, difficulty swallowing Pulmonary: Negative; No cough, wheezing, shortness of breath,  hemoptysis Cardiovascular: See history of present illness GI: Recent gastritis GU: Negative; No dysuria, hematuria, or difficulty voiding Musculoskeletal: Had left knee surgery by Dr. Percell Miller.  History of left hip bursitis. Hematologic/Oncology: Iron deficiency anemia Endocrine: Negative; no heat/cold intolerance; no diabetes Neuro: Pinched nerve lower back Skin: Negative; No rashes or skin lesions Psychiatric: Negative; No behavioral problems, depression Sleep: History of complex sleep apnea; currently not on therapy snoring, daytime sleepiness, hypersomnolence, bruxism, restless legs, hypnogognic hallucinations, no cataplexy Other comprehensive 14 point system review is negative.   PE BP 133/76   Pulse 63   Temp (!) 97.3 F (36.3 C)   Ht '5\' 7"'  (1.702 m)   Wt 150 lb (68 kg)   SpO2 97%   BMI 23.49 kg/m    Repeat blood pressure by me 126/76  Wt Readings from Last 3 Encounters:  12/17/19 150 lb (68 kg)  05/25/19 158 lb 3.2 oz (71.8 kg)  04/25/18 162 lb 6.4 oz (73.7 kg)   General: Alert, oriented, no distress.  Skin: normal turgor, no rashes, warm and dry HEENT: Normocephalic, atraumatic. Pupils equal round and reactive to light; sclera anicteric; extraocular muscles intact;  Nose without nasal septal hypertrophy Mouth/Parynx benign; Mallinpatti scale 3 Neck: No JVD, no carotid bruits; normal carotid upstroke Lungs: clear to ausculatation and percussion; no wheezing or rales Chest wall: without tenderness to palpitation Heart: PMI not displaced, RRR, s1 s2 normal, 1/6 systolic murmur, no diastolic murmur, no rubs, gallops, thrills, or heaves Abdomen: soft, nontender; no hepatosplenomehaly, BS+; abdominal aorta nontender and not dilated by palpation. Back: no CVA tenderness Pulses 2+ Musculoskeletal: full range of motion, normal strength, no joint deformities Extremities: no clubbing cyanosis or edema, Homan's sign negative  Neurologic: grossly nonfocal; Cranial nerves  grossly wnl  Psychologic: Normal mood and affect  ECG (independently read by me): NSR at 63; nonspecific T changes; Normal intervals; no ectopy  May 2019 ECG (independently read by me): Normal sinus rhythm at 79 bpm.  Nonspecific T changes.  Normal intervals.  No ectopy.  November 2018 ECG (independently read by me): Normal sinus rhythm at 66 bpm.  Nonspecific T-wave changes.  QTc interval 396 ms.  May 2018 ECG (independently read by me): Normal sinus rhythm at 71 bpm with sinus arrhythmia.  Nonspecific T changes.  Normal intervals.  May 2017 ECG (independently read by me): Normal sinus rhythm at 68 bpm.  Nonspecific ST changes inferolaterally.  QTc interval 412 ms.  April 2016 ECG (independently read by me): Normal sinus rhythm at 80 bpm.  Normal intervals.  No significant ST segment changes  January 2015 ECG (independently read by me): Sinus rhythm 87 beats per minute. Complete left bundle-branch block. Nonspecific T changes.  LABS:  BMP Latest Ref Rng & Units 02/03/2018 08/17/2014 01/03/2010  Glucose 65 - 99 mg/dL 93 119(H) 97  BUN 8 - 27 mg/dL 22 18 24(H)  Creatinine 0.76 - 1.27 mg/dL 1.24 1.16 1.44  BUN/Creat Ratio 10 - 24 18 - -  Sodium 134 - 144 mmol/L 139 141 140  Potassium 3.5 - 5.2 mmol/L 4.4 4.0 3.8  Chloride 96 - 106 mmol/L 104 104 106  CO2 20 - 29 mmol/L '20 23 24  ' Calcium 8.6 - 10.2 mg/dL 9.3 9.3 9.5    Hepatic Function Latest Ref Rng & Units 02/03/2018 01/03/2010  Total Protein 6.0 - 8.5 g/dL 6.3 6.9  Albumin 3.5 - 4.8 g/dL 4.1 3.9  AST 0 - 40 IU/L 24 18  ALT 0 - 44 IU/L 21 15  Alk Phosphatase 39 - 117 IU/L 112 85  Total Bilirubin 0.0 - 1.2 mg/dL 0.4 0.5    No results found for: WBC   BNP No results found for: PROBNP   Lipid Panel     Component Value Date/Time   CHOL 124 02/03/2018 0843   TRIG 152 (H) 02/03/2018 0843   HDL 35 (L) 02/03/2018 0843   CHOLHDL 3.5 02/03/2018 0843   LDLCALC 59 02/03/2018 0843   LABVLDL 30 02/03/2018 0843    RADIOLOGY: No  results found.  IMPRESSION:  1. Coronary artery disease involving native coronary artery of native heart without angina pectoris   2. Hx of CABG   3. Essential hypertension   4. Hyperlipidemia with target LDL less than 70   5. Type 2 diabetes mellitus without complication, without long-term current use of insulin ([place])   6. Gastritis without bleeding, unspecified chronicity, unspecified gastritis type    ASSESSMENT AND PLAN: Bruce Gray is an 79 year old gentleman who is 34 years status post CABG surgery done by Dr. Glade Nurse in 1986. His last cardiac catheterization was done by Dr. Rex Kras in July 2010 which showed dense calcification of his native coronary arteries. All native vessels were occluded proximally. There was a patent sequential graft to the diagonal and OM with a proximal tapering of 40%. The sequential graft to the AM and PDA was large caliber and widely patent. His LIMA graft to the LAD was small and patent.  At present, Bruce Gray continues to be angina free and has stable blood pressure on his current medical regimen consisting of amlodipine 5 mg, carvedilol 6.25 mg twice a day, isosorbide 15 mg daily in addition to furosemide every other day 20 mg.  He is no  longer taking aspirin therapy secondary to development of gastritis.  He continues to be on atorvastatin 80 mg for hyperlipidemia.  Laboratory in January 2020 showed an LDL cholesterol at 58.  He sees [place] Rankin for primary care who checks his laboratory.  He is diabetic on metformin and is now taking this 500 mg daily.  There is a history of iron deficiency anemia for which he is still on iron supplementation.  He is on omeprazole for his gastritis and GERD.  He continues his state that he sleeps well.  He is not using CPAP therapy.  I will asked that his laboratory be sent to me once complete.  As long as he remains stable I will see him in 1 year for reevaluation   Time spent: 25 minutes Troy Sine, MD, [facility]   12/19/2019 2:50 PM

## 2019-12-17 NOTE — Patient Instructions (Signed)
Medication Instructions:  NO CHANGES *If you need a refill on your cardiac medications before your next appointment, please call your pharmacy*  Lab Work: If you have labs (blood work) drawn today and your tests are completely normal, you will receive your results only by: Marland Kitchen MyChart Message (if you have MyChart) OR . A paper copy in the mail If you have any lab test that is abnormal or we need to change your treatment, we will call you to review the results.    Follow-Up: At [facility], you and your health needs are our priority.  As part of our continuing mission to provide you with exceptional heart care, we have created designated Provider Care Teams.  These Care Teams include your primary Cardiologist (physician) and Advanced Practice Providers (APPs -  Physician Assistants and Nurse Practitioners) who all work together to provide you with the care you need, when you need it.  Your next appointment:   12 month(s)  The format for your next appointment:   Either In Person or Virtual  Provider:   You may see Shelva Majestic, MD or one of the following Advanced Practice Providers on your designated Care Team:    Almyra Deforest, PA-C  Fabian Sharp, PA-C or   Roby Lofts, [place]

## 2019-12-19 ENCOUNTER — Encounter: Payer: Self-pay | Admitting: Cardiovascular Disease

## 2020-01-06 DIAGNOSIS — E1169 Type 2 diabetes mellitus with other specified complication: Secondary | ICD-10-CM | POA: Diagnosis not present

## 2020-01-06 DIAGNOSIS — L570 Actinic keratosis: Secondary | ICD-10-CM | POA: Diagnosis not present

## 2020-01-06 DIAGNOSIS — D1801 Hemangioma of skin and subcutaneous tissue: Secondary | ICD-10-CM | POA: Diagnosis not present

## 2020-01-06 DIAGNOSIS — E7800 Pure hypercholesterolemia, unspecified: Secondary | ICD-10-CM | POA: Diagnosis not present

## 2020-01-06 DIAGNOSIS — Z7984 Long term (current) use of oral hypoglycemic drugs: Secondary | ICD-10-CM | POA: Diagnosis not present

## 2020-01-06 DIAGNOSIS — L821 Other seborrheic keratosis: Secondary | ICD-10-CM | POA: Diagnosis not present

## 2020-01-11 DIAGNOSIS — Z8546 Personal history of malignant neoplasm of prostate: Secondary | ICD-10-CM | POA: Diagnosis not present

## 2020-01-11 DIAGNOSIS — E1169 Type 2 diabetes mellitus with other specified complication: Secondary | ICD-10-CM | POA: Diagnosis not present

## 2020-01-11 DIAGNOSIS — Z0000 Encounter for general adult medical examination without abnormal findings: Secondary | ICD-10-CM | POA: Diagnosis not present

## 2020-01-11 DIAGNOSIS — R160 Hepatomegaly, not elsewhere classified: Secondary | ICD-10-CM | POA: Diagnosis not present

## 2020-01-11 DIAGNOSIS — E7800 Pure hypercholesterolemia, unspecified: Secondary | ICD-10-CM | POA: Diagnosis not present

## 2020-01-11 DIAGNOSIS — E785 Hyperlipidemia, unspecified: Secondary | ICD-10-CM | POA: Diagnosis not present

## 2020-01-11 DIAGNOSIS — I10 Essential (primary) hypertension: Secondary | ICD-10-CM | POA: Diagnosis not present

## 2020-01-11 DIAGNOSIS — R748 Abnormal levels of other serum enzymes: Secondary | ICD-10-CM | POA: Diagnosis not present

## 2020-01-11 DIAGNOSIS — I2510 Atherosclerotic heart disease of native coronary artery without angina pectoris: Secondary | ICD-10-CM | POA: Diagnosis not present

## 2020-01-13 DIAGNOSIS — I10 Essential (primary) hypertension: Secondary | ICD-10-CM | POA: Diagnosis not present

## 2020-01-13 DIAGNOSIS — N189 Chronic kidney disease, unspecified: Secondary | ICD-10-CM | POA: Diagnosis not present

## 2020-01-13 DIAGNOSIS — E119 Type 2 diabetes mellitus without complications: Secondary | ICD-10-CM | POA: Diagnosis not present

## 2020-01-13 DIAGNOSIS — E1169 Type 2 diabetes mellitus with other specified complication: Secondary | ICD-10-CM | POA: Diagnosis not present

## 2020-01-13 DIAGNOSIS — E785 Hyperlipidemia, unspecified: Secondary | ICD-10-CM | POA: Diagnosis not present

## 2020-01-13 DIAGNOSIS — Z8546 Personal history of malignant neoplasm of prostate: Secondary | ICD-10-CM | POA: Diagnosis not present

## 2020-01-13 DIAGNOSIS — I2510 Atherosclerotic heart disease of native coronary artery without angina pectoris: Secondary | ICD-10-CM | POA: Diagnosis not present

## 2020-01-13 DIAGNOSIS — E7800 Pure hypercholesterolemia, unspecified: Secondary | ICD-10-CM | POA: Diagnosis not present

## 2020-01-13 DIAGNOSIS — H269 Unspecified cataract: Secondary | ICD-10-CM | POA: Diagnosis not present

## 2020-01-14 ENCOUNTER — Other Ambulatory Visit: Payer: Self-pay | Admitting: Family Medicine

## 2020-01-14 DIAGNOSIS — R160 Hepatomegaly, not elsewhere classified: Secondary | ICD-10-CM

## 2020-01-21 ENCOUNTER — Ambulatory Visit
Admission: RE | Admit: 2020-01-21 | Discharge: 2020-01-21 | Disposition: A | Discharge status: Home / Self Care | Payer: Medicare HMO | Source: Ambulatory Visit | Attending: Family Medicine | Admitting: Family Medicine

## 2020-01-21 DIAGNOSIS — R160 Hepatomegaly, not elsewhere classified: Secondary | ICD-10-CM | POA: Diagnosis not present

## 2020-01-25 ENCOUNTER — Other Ambulatory Visit (HOSPITAL_BASED_OUTPATIENT_CLINIC_OR_DEPARTMENT_OTHER): Payer: Self-pay | Admitting: Family Medicine

## 2020-01-25 DIAGNOSIS — K769 Liver disease, unspecified: Secondary | ICD-10-CM

## 2020-01-27 ENCOUNTER — Ambulatory Visit (HOSPITAL_COMMUNITY)
Admission: RE | Admit: 2020-01-27 | Discharge: 2020-01-27 | Disposition: A | Discharge status: Home / Self Care | Payer: Medicare HMO | Source: Ambulatory Visit | Attending: Family Medicine | Admitting: Family Medicine

## 2020-01-27 ENCOUNTER — Other Ambulatory Visit: Payer: Self-pay

## 2020-01-27 DIAGNOSIS — K769 Liver disease, unspecified: Secondary | ICD-10-CM | POA: Insufficient documentation

## 2020-01-27 DIAGNOSIS — K8689 Other specified diseases of pancreas: Secondary | ICD-10-CM | POA: Diagnosis not present

## 2020-01-27 MED ORDER — GADOBUTROL 1 MMOL/ML IV SOLN
7.5000 mL | Freq: Once | INTRAVENOUS | Status: AC | PRN
  Administered 2020-01-27: 15:00:00 7.5 mL via INTRAVENOUS

## 2020-01-30 ENCOUNTER — Other Ambulatory Visit (HOSPITAL_BASED_OUTPATIENT_CLINIC_OR_DEPARTMENT_OTHER): Payer: Medicare HMO

## 2020-02-02 ENCOUNTER — Other Ambulatory Visit: Payer: Self-pay

## 2020-02-04 ENCOUNTER — Ambulatory Visit: Payer: Medicare HMO | Admitting: Hematology & Oncology

## 2020-02-04 ENCOUNTER — Inpatient Hospital Stay: Payer: Medicare HMO

## 2020-02-05 ENCOUNTER — Encounter: Payer: Self-pay | Admitting: Hematology & Oncology

## 2020-02-05 ENCOUNTER — Inpatient Hospital Stay (HOSPITAL_BASED_OUTPATIENT_CLINIC_OR_DEPARTMENT_OTHER): Payer: Medicare HMO | Admitting: Hematology & Oncology

## 2020-02-05 ENCOUNTER — Encounter: Payer: Self-pay | Admitting: *Deleted

## 2020-02-05 ENCOUNTER — Other Ambulatory Visit: Payer: Self-pay

## 2020-02-05 ENCOUNTER — Inpatient Hospital Stay: Payer: Medicare HMO | Attending: Hematology & Oncology

## 2020-02-05 VITALS — BP 152/78 | HR 72 | Temp 97.1°F | Resp 18 | Ht 65.0 in | Wt 143.0 lb

## 2020-02-05 DIAGNOSIS — C799 Secondary malignant neoplasm of unspecified site: Secondary | ICD-10-CM

## 2020-02-05 DIAGNOSIS — E119 Type 2 diabetes mellitus without complications: Secondary | ICD-10-CM | POA: Diagnosis not present

## 2020-02-05 DIAGNOSIS — R160 Hepatomegaly, not elsewhere classified: Secondary | ICD-10-CM | POA: Diagnosis not present

## 2020-02-05 DIAGNOSIS — R634 Abnormal weight loss: Secondary | ICD-10-CM | POA: Insufficient documentation

## 2020-02-05 DIAGNOSIS — Z8546 Personal history of malignant neoplasm of prostate: Secondary | ICD-10-CM | POA: Diagnosis not present

## 2020-02-05 DIAGNOSIS — R933 Abnormal findings on diagnostic imaging of other parts of digestive tract: Secondary | ICD-10-CM | POA: Insufficient documentation

## 2020-02-05 DIAGNOSIS — Z85820 Personal history of malignant melanoma of skin: Secondary | ICD-10-CM

## 2020-02-05 DIAGNOSIS — I2510 Atherosclerotic heart disease of native coronary artery without angina pectoris: Secondary | ICD-10-CM | POA: Diagnosis not present

## 2020-02-05 DIAGNOSIS — I10 Essential (primary) hypertension: Secondary | ICD-10-CM | POA: Insufficient documentation

## 2020-02-05 DIAGNOSIS — C259 Malignant neoplasm of pancreas, unspecified: Secondary | ICD-10-CM

## 2020-02-05 LAB — CMP (CANCER CENTER ONLY)
ALT: 24 U/L (ref 0–44)
AST: 38 U/L (ref 15–41)
Albumin: 4.4 g/dL (ref 3.5–5.0)
Alkaline Phosphatase: 190 U/L — ABNORMAL HIGH (ref 38–126)
Anion gap: 10 (ref 5–15)
BUN: 23 mg/dL (ref 8–23)
CO2: 25 mmol/L (ref 22–32)
Calcium: 10.1 mg/dL (ref 8.9–10.3)
Chloride: 106 mmol/L (ref 98–111)
Creatinine: 1.27 mg/dL — ABNORMAL HIGH (ref 0.61–1.24)
GFR, Est AFR Am: >60 mL/min (ref >60)
GFR, Estimated: 53 mL/min — ABNORMAL LOW (ref >60)
Glucose, Bld: 110 mg/dL — ABNORMAL HIGH (ref 70–99)
Potassium: 4.1 mmol/L (ref 3.5–5.1)
Sodium: 141 mmol/L (ref 135–145)
Total Bilirubin: 0.8 mg/dL (ref 0.3–1.2)
Total Protein: 7.3 g/dL (ref 6.5–8.1)

## 2020-02-05 LAB — CBC WITH DIFFERENTIAL (CANCER CENTER ONLY)
Abs Immature Granulocytes: 0.03 10*3/uL (ref 0.00–0.07)
Basophils Absolute: 0.1 10*3/uL (ref 0.0–0.1)
Basophils Relative: 1 %
Eosinophils Absolute: 1.3 10*3/uL — ABNORMAL HIGH (ref 0.0–0.5)
Eosinophils Relative: 11 %
HCT: 41.4 % (ref 39.0–52.0)
Hemoglobin: 13.7 g/dL (ref 13.0–17.0)
Immature Granulocytes: 0 %
Lymphocytes Relative: 19 %
Lymphs Abs: 2.2 10*3/uL (ref 0.7–4.0)
MCH: 32.8 pg (ref 26.0–34.0)
MCHC: 33.1 g/dL (ref 30.0–36.0)
MCV: 99 fL (ref 80.0–100.0)
Monocytes Absolute: 1.1 10*3/uL — ABNORMAL HIGH (ref 0.1–1.0)
Monocytes Relative: 9 %
Neutro Abs: 7.1 10*3/uL (ref 1.7–7.7)
Neutrophils Relative %: 60 %
Platelet Count: 181 10*3/uL (ref 150–400)
RBC: 4.18 MIL/uL — ABNORMAL LOW (ref 4.22–5.81)
RDW: 14.4 % (ref 11.5–15.5)
WBC Count: 11.9 10*3/uL — ABNORMAL HIGH (ref 4.0–10.5)
nRBC: 0 % (ref 0.0–0.2)

## 2020-02-05 LAB — PREALBUMIN: Prealbumin: 16.1 mg/dL — ABNORMAL LOW (ref 18–38)

## 2020-02-05 MED ORDER — PANCRELIPASE (LIP-PROT-AMYL) 36000-114000 UNITS PO CPEP: 72000.0000 [IU] | ORAL_CAPSULE | Freq: Three times a day (TID) | ORAL | 3 refills | Status: DC

## 2020-02-05 NOTE — Progress Notes (Signed)
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 Hanley Seamen patient "Your Patient Navigator" handout which explains my role, areas in which I am able to help, and all the contact information for myself and the office. Also gave patient MD and Navigator business card. Reviewed with patient the general overview of expected course after initial diagnosis and time frame for all steps to be completed.  New patient packet given to patient which includes: orientation to office and staff; campus directory; education on My Chart and Advance Directives; and patient centered education on pancreatic cancer.   Patient completed visit with Dr. Marin Olp  Will follow up with patient needs on Monday once Dr Marin Olp places orders.   Patient understands all follow up procedures and expectations. They have my number to reach out for any further clarification or additional needs.

## 2020-02-05 NOTE — Progress Notes (Signed)
Referral MD  Reason for Referral: Pancreatic cancer with liver metastasis  Chief Complaint  Patient presents with  . New Patient (Initial Visit)  : I have a tumor my pancreas.  HPI: Bruce Gray is a very nice 79 year old white male.  He is originally from [place].  He goes to church with one of my former patients.  He comes in with his wife.  The big issue with him is that he has been losing weight.  In about a year, he is lost about 20 pounds.  He sees Dr. Milagros Evener.  As always, Dr. Radene Ou is very thorough.  Back in early February, Dr. Radene Ou noted an elevated alkaline phosphatase and an enlarged liver on examination.  She went ahead and ordered an ultrasound of his liver.  This, unfortunately, showed that he had multiple echogenic masses.  She then went ahead and got an MRI of his abdomen and pelvis.  This was done on 01/31/2020.  This showed a 3.9 x 2.7 cm mass in the pancreatic tail.  He had adenopathy in the celiac, portal gastrohepatic areas.  He had diffuse hepatic metastasis.  Based on this, she called our office.  Bruce Gray had known about me through his church member.  As such, we have him in today for an evaluation.  He does look quite vigorous.  Again, comes in with his wife.  He does get full pretty easily.  He does get bloated.  Has had no diarrhea.  He has had no bleeding.  He has had some nausea but no vomiting.  He has had hyperglycemia for about 3 or 4 years.  He is on Metformin.  There is been no leg swelling.  He has had no rashes.  He has had no headache.  He has had no cough.  There is no history of pancreatic cancer in the family.  I think his mother had breast cancer.  He used to smoke probably 6 years ago.  He really does not have much in the way of any alcohol use.  Overall, I would say his performance status is ECOG 1.   Past Medical History:  Diagnosis Date  . CAD (coronary artery disease)   . Complex sleep apnea syndrome    does not use  a cpap-cannot  . Diabetes mellitus ([place])    type 2  . Gout   . Heart disease   . High cholesterol   . HOH (hard of hearing)   . Hypertension   . Liver disease   . Mild renal insufficiency   . 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 CARDIAC CATHETERIZATION  86,2010  . CARDIOVASCULAR STRESS TEST  2008  . CHOLECYSTECTOMY  2011  . COLONOSCOPY    . CORONARY ARTERY BYPASS GRAFT  1986  . DOPPLER ECHOCARDIOGRAPHY     mild concentric LVH with mild grade 1 diastolic dysfunction with normal tissue doppler parameters  . HEMORRHOID SURGERY  2007  . INSERTION PROSTATE RADIATION SEED  2013  . KNEE ARTHROSCOPY WITH MEDIAL MENISECTOMY Left 08/20/2014   Procedure: LEFT KNEE ARTHROSCOPY WITH DEBRIDEMENT/SHAVING (CHONDROPLASTY), MEDIAL MENISECTOMY, EXCISION OF PLICA;  Surgeon: Ninetta Lights, MD;  Location: [place];  Service: Orthopedics;  Laterality: Left;  . TONSILLECTOMY    :   Current Outpatient Medications:  .  allopurinol (ZYLOPRIM) 100 MG tablet, Take 200 mg by mouth daily. , Disp: , Rfl:  .  amLODipine (  NORVASC) 5 MG tablet, Take 1 tablet (5 mg total) by mouth daily. KEEP UPCOMING APPT, Disp: 90 tablet, Rfl: 0 .  atorvastatin (LIPITOR) 80 MG tablet, TAKE 1 TABLET EVERY DAY  AT  6PM KEEP UPCOMING APPT, Disp: 90 tablet, Rfl: 0 .  carvedilol (COREG) 6.25 MG tablet, Take 1 tablet (6.25 mg total) by mouth 2 (two) times daily with a meal. KEEP UPCOMING APPT, Disp: 180 tablet, Rfl: 0 .  docusate sodium (COLACE) 100 MG capsule, Take 100 mg by mouth daily., Disp: , Rfl:  .  FEROSUL 325 (65 Fe) MG tablet, , Disp: , Rfl:  .  furosemide (LASIX) 20 MG tablet, Take 20 mg by mouth every other day. , Disp: , Rfl:  .  isosorbide mononitrate (IMDUR) 30 MG 24 hr tablet, Take 0.5 tablets (15 mg total) by mouth daily., Disp: 45 tablet, Rfl: 1 .  metFORMIN (GLUCOPHAGE-XR) 500 MG 24 hr tablet, Take 500 mg by mouth daily., Disp: , Rfl:   .  omeprazole (PRILOSEC) 20 MG capsule, Take 1 capsule (20 mg total) by mouth daily., Disp: 90 capsule, Rfl: 3 .  vitamin B-12 (CYANOCOBALAMIN) 500 MCG tablet, Take 500 mcg by mouth daily., Disp: , Rfl: :  :  Allergies  Allergen Reactions  . Codeine Nausea And Vomiting  . Lansoprazole Nausea And Vomiting and Diarrhea  :  Family History  Problem Relation Age of Onset  . Cancer - Other Mother        breast  . Stroke Father   :  Social History   Socioeconomic History  . Marital status: Married    Spouse name: Not on file  . Number of children: 2  . Years of education: 84  . Highest education level: Not on file  Occupational History  . Occupation: retired  Tobacco Use  . Smoking status: Former Smoker    Quit date: 07/03/1958    Years since quitting: 61.6  . Smokeless tobacco: Never Used  Substance and Sexual Activity  . Alcohol use: No    Comment: occasional  . Drug use: No  . Sexual activity: Not on file  Other Topics Concern  . Not on file  Social History Narrative  . Not on file   Social Determinants of Health   Financial Resource Strain:   . Difficulty of Paying Living Expenses: Not on file  Food Insecurity:   . Worried About Charity fundraiser in the Last Year: Not on file  . Ran Out of Food in the Last Year: Not on file  Transportation Needs:   . Lack of Transportation (Medical): Not on file  . Lack of Transportation (Non-Medical): Not on file  Physical Activity:   . Days of Exercise per Week: Not on file  . Minutes of Exercise per Session: Not on file  Stress:   . Feeling of Stress : Not on file  Social Connections:   . Frequency of Communication with Friends and Family: Not on file  . Frequency of Social Gatherings with Friends and Family: Not on file  . Attends Religious Services: Not on file  . Active Member of Clubs or Organizations: Not on file  . Attends Archivist Meetings: Not on file  . Marital Status: Not on file  Intimate  Partner Violence:   . Fear of Current or Ex-Partner: Not on file  . Emotionally Abused: Not on file  . Physically Abused: Not on file  . Sexually Abused: Not on file  :  Review  of Systems  Constitutional: Positive for weight loss.  HENT: Negative.   Eyes: Negative.   Respiratory: Negative.   Cardiovascular: Negative.   Gastrointestinal: Positive for abdominal pain, heartburn and nausea.  Genitourinary: Negative.   Musculoskeletal: Negative.   Skin: Negative.   Neurological: Negative.   Endo/Heme/Allergies: Negative.   Psychiatric/Behavioral: Negative.      Exam: This is a fairly well-developed and well-nourished white male in no obvious distress.  Vital signs show temperature of 97.1.  Pulse 72.  Blood pressure 152/78.  Weight is 143 pounds.  Head and neck exam shows no ocular or oral lesions.  He has no scleral icterus.  There are no palpable lymph nodes in the cervical or supraclavicular regions.  Lungs are clear bilaterally.  Cardiac exam regular rate and rhythm with no murmurs, rubs or bruits.  Abdomen is soft.  He has decent bowel sounds.  There is no obvious fluid wave.  There is no obvious abdominal mass.  His liver edge is about 3 cm below the right costal margin.  Extremities shows no clubbing, cyanosis or edema.  He has some age-related osteoarthritic changes.  Skin exam shows no rashes, ecchymoses or petechia.  Neurological exam shows no focal neurological deficits.  @IPVITALS @   Recent Labs    02/05/20 1204  WBC 11.9*  HGB 13.7  HCT 41.4  PLT 181   Recent Labs    02/05/20 1204  NA 141  K 4.1  CL 106  CO2 25  GLUCOSE 110*  BUN 23  CREATININE 1.27*  CALCIUM 10.1    Blood smear review: None  Pathology: Pending    Assessment and Plan: Bruce Gray is a very nice 79 year old white male.  I had to believe that he is going to have adenocarcinoma of the pancreas.  This would already be stage IV disease.  We will have to see what his CA 19-9 is.  We are  definitely going to need a biopsy to confirm histologically that this is malignancy.  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  However, I would not think that this would be the etiology of this pancreatic tail mass in the liver lesions.  He does seem to be in good shape.  I think that if he wished to have treatment, we probably could give him therapy.  I would consider him to be a candidate for gemcitabine/Abraxane.  I think he could manage this.  I do not think he can manage FOLFIRINOX.  I think the weight loss is critical.  This does troubled me.  He is not that big of a guy.  He really cannot lose much more weight.  I will try him on Creon.  Maybe, this will allow his pancreas to rest a little bit and allow him to digest better and absorb better.  I talked to he and his wife at length.  I spent about an hour or so with them.  I told them that this is a malignancy that we can treat but we cannot cure.  Given that this is already stage IV disease, we just do not going to be able to cure this.  Our goal is definitely to allow him to eat better and to gain some weight.  We want quality of life to be a top priority.  He will need a CT scan of the chest for complete staging.  I do not see that a PET scan is  going to help [place] out here.  We will try to get the biopsy next week.  Hopefully, we will have enough tissue to be able to send off molecular markers to see if we might be able to utilize targeted therapy.  I answered all their questions.  We gave him a prayer blanket.  We had a good prayer session which he very much appreciated.  We will get him back once we know the diagnosis and then we can set up our treatment protocol with him.

## 2020-02-08 ENCOUNTER — Telehealth: Payer: Self-pay | Admitting: Hematology & Oncology

## 2020-02-08 ENCOUNTER — Other Ambulatory Visit: Payer: Medicare HMO

## 2020-02-08 ENCOUNTER — Encounter (HOSPITAL_COMMUNITY): Payer: Self-pay

## 2020-02-08 ENCOUNTER — Encounter: Payer: Self-pay | Admitting: *Deleted

## 2020-02-08 ENCOUNTER — Other Ambulatory Visit: Payer: Self-pay | Admitting: *Deleted

## 2020-02-08 LAB — LACTATE DEHYDROGENASE: LDH: 164 U/L (ref 98–192)

## 2020-02-08 LAB — CEA (IN HOUSE-CHCC): CEA (CHCC-In House): 2.77 ng/mL (ref 0.00–5.00)

## 2020-02-08 MED ORDER — PANCRELIPASE (LIP-PROT-AMYL) 36000-114000 UNITS PO CPEP: 72000.0000 [IU] | ORAL_CAPSULE | Freq: Three times a day (TID) | ORAL | 0 refills | Status: DC

## 2020-02-08 MED FILL — CREON 36000 UNIT CPEP: 36000 | 9 days supply | Qty: 56 | Fill #0

## 2020-02-08 NOTE — Progress Notes (Signed)
After the patient had his appointment last week, he will need a CT scan and biopsy.   Patient's wife, Gatha Mayer, called first thing this morning to have CT scheduled. Explained that we need to first work on [place] but once I had an update I would let her know.  Scheduled first available US biopsy at [facility] on 02/16/20. Elvina Sidle was scheduling out to middle of March, so we opted for earlier appointment.  CT scan at [facility]. Authorization obtained and given to GI. They will reach out and schedule with patient.  Yahoo and confirmed she had all the information regarding the biopsy. Also let her know that GI had the authorization and would be reaching out to schedule. She mentioned a Creon prescription that was very expensive for her and she was unable to fill the entire prescription. I told her that I would look into enrolling the patient into the [place] program to assist with access and that I also had a voucher for 56 free capsules I would get for the patient. She was appreciative of the help.   Sent fax enrolling patient in [place] program and sent 56-capsule supply at no cost voucher to for pharmacy to fill and ship to patient.   She knows to reach out with any further questions or concerns.

## 2020-02-08 NOTE — Progress Notes (Unsigned)
Bruce Gray, 79 y.o., 11-28-1941 MRN:  [id number] Phone:  [phone] Jerilynn Mages) PCP:  Aretta Nip, MD Coverage:  [facility] Medicare/[employer] Medicare Hmo  RE: Biopsy Received: Today Message Contents  Bruce Daft, MD  Ernestene Mention for US guided biopsy of a hepatic lesion.    [place]   Previous Messages  ----- Message -----  From: Lenore Cordia  Sent: 02/05/2020  3:49 PM EST  To: [facility] Procedure Requests  Subject: Biopsy                      Procedure Requested: US Biopsy (Liver)    Reason for Procedure: Likely pancreatic cancer. We need multiple biopsies for molecular studies.    Provider Requesting: Volanda Napoleon  Provider Telephone: [phone]    Other Info: Rad exams in Epic

## 2020-02-08 NOTE — Telephone Encounter (Signed)
No los 2/26

## 2020-02-09 LAB — CANCER ANTIGEN 19-9: CA 19-9: 151 U/mL — ABNORMAL HIGH (ref 0–35)

## 2020-02-11 ENCOUNTER — Ambulatory Visit
Admission: RE | Admit: 2020-02-11 | Discharge: 2020-02-11 | Disposition: A | Discharge status: Home / Self Care | Payer: Medicare HMO | Source: Ambulatory Visit | Attending: Hematology & Oncology | Admitting: Hematology & Oncology

## 2020-02-11 DIAGNOSIS — C799 Secondary malignant neoplasm of unspecified site: Secondary | ICD-10-CM

## 2020-02-11 DIAGNOSIS — C259 Malignant neoplasm of pancreas, unspecified: Secondary | ICD-10-CM | POA: Diagnosis not present

## 2020-02-11 DIAGNOSIS — C787 Secondary malignant neoplasm of liver and intrahepatic bile duct: Secondary | ICD-10-CM | POA: Diagnosis not present

## 2020-02-13 ENCOUNTER — Encounter: Payer: Self-pay | Admitting: *Deleted

## 2020-02-15 ENCOUNTER — Other Ambulatory Visit: Payer: Self-pay | Admitting: Student

## 2020-02-15 ENCOUNTER — Encounter: Payer: Self-pay | Admitting: *Deleted

## 2020-02-15 ENCOUNTER — Other Ambulatory Visit: Payer: Self-pay | Admitting: *Deleted

## 2020-02-15 MED ORDER — PANCRELIPASE (LIP-PROT-AMYL) 36000-114000 UNITS PO CPEP: 72000.0000 [IU] | ORAL_CAPSULE | Freq: Three times a day (TID) | ORAL | 11 refills | Status: DC

## 2020-02-15 NOTE — Progress Notes (Signed)
[employer] Assist application completed and sent for patient.

## 2020-02-16 ENCOUNTER — Other Ambulatory Visit: Payer: Self-pay

## 2020-02-16 ENCOUNTER — Ambulatory Visit (HOSPITAL_COMMUNITY)
Admission: RE | Admit: 2020-02-16 | Discharge: 2020-02-16 | Disposition: A | Discharge status: Home / Self Care | Payer: Medicare HMO | Source: Ambulatory Visit | Attending: Hematology & Oncology | Admitting: Hematology & Oncology

## 2020-02-16 DIAGNOSIS — Z79899 Other long term (current) drug therapy: Secondary | ICD-10-CM | POA: Insufficient documentation

## 2020-02-16 DIAGNOSIS — Z951 Presence of aortocoronary bypass graft: Secondary | ICD-10-CM | POA: Diagnosis not present

## 2020-02-16 DIAGNOSIS — E7800 Pure hypercholesterolemia, unspecified: Secondary | ICD-10-CM | POA: Insufficient documentation

## 2020-02-16 DIAGNOSIS — C799 Secondary malignant neoplasm of unspecified site: Secondary | ICD-10-CM

## 2020-02-16 DIAGNOSIS — Z9049 Acquired absence of other specified parts of digestive tract: Secondary | ICD-10-CM | POA: Diagnosis not present

## 2020-02-16 DIAGNOSIS — E119 Type 2 diabetes mellitus without complications: Secondary | ICD-10-CM | POA: Insufficient documentation

## 2020-02-16 DIAGNOSIS — K869 Disease of pancreas, unspecified: Secondary | ICD-10-CM | POA: Insufficient documentation

## 2020-02-16 DIAGNOSIS — I2510 Atherosclerotic heart disease of native coronary artery without angina pectoris: Secondary | ICD-10-CM | POA: Diagnosis not present

## 2020-02-16 DIAGNOSIS — I10 Essential (primary) hypertension: Secondary | ICD-10-CM | POA: Diagnosis not present

## 2020-02-16 DIAGNOSIS — R160 Hepatomegaly, not elsewhere classified: Secondary | ICD-10-CM | POA: Diagnosis not present

## 2020-02-16 DIAGNOSIS — Z7984 Long term (current) use of oral hypoglycemic drugs: Secondary | ICD-10-CM | POA: Diagnosis not present

## 2020-02-16 DIAGNOSIS — Z823 Family history of stroke: Secondary | ICD-10-CM | POA: Diagnosis not present

## 2020-02-16 DIAGNOSIS — H9190 Unspecified hearing loss, unspecified ear: Secondary | ICD-10-CM | POA: Insufficient documentation

## 2020-02-16 DIAGNOSIS — G4733 Obstructive sleep apnea (adult) (pediatric): Secondary | ICD-10-CM | POA: Insufficient documentation

## 2020-02-16 DIAGNOSIS — M109 Gout, unspecified: Secondary | ICD-10-CM | POA: Insufficient documentation

## 2020-02-16 DIAGNOSIS — N289 Disorder of kidney and ureter, unspecified: Secondary | ICD-10-CM | POA: Insufficient documentation

## 2020-02-16 DIAGNOSIS — C259 Malignant neoplasm of pancreas, unspecified: Secondary | ICD-10-CM

## 2020-02-16 DIAGNOSIS — Z8546 Personal history of malignant neoplasm of prostate: Secondary | ICD-10-CM | POA: Insufficient documentation

## 2020-02-16 DIAGNOSIS — C7A8 Other malignant neuroendocrine tumors: Secondary | ICD-10-CM | POA: Diagnosis not present

## 2020-02-16 DIAGNOSIS — Z885 Allergy status to narcotic agent status: Secondary | ICD-10-CM | POA: Insufficient documentation

## 2020-02-16 DIAGNOSIS — Z87891 Personal history of nicotine dependence: Secondary | ICD-10-CM | POA: Diagnosis not present

## 2020-02-16 DIAGNOSIS — Z888 Allergy status to other drugs, medicaments and biological substances status: Secondary | ICD-10-CM | POA: Diagnosis not present

## 2020-02-16 DIAGNOSIS — K769 Liver disease, unspecified: Secondary | ICD-10-CM | POA: Diagnosis present

## 2020-02-16 LAB — CBC
HCT: 38.4 % — ABNORMAL LOW (ref 39.0–52.0)
Hemoglobin: 12.5 g/dL — ABNORMAL LOW (ref 13.0–17.0)
MCH: 33 pg (ref 26.0–34.0)
MCHC: 32.6 g/dL (ref 30.0–36.0)
MCV: 101.3 fL — ABNORMAL HIGH (ref 80.0–100.0)
Platelets: 164 10*3/uL (ref 150–400)
RBC: 3.79 MIL/uL — ABNORMAL LOW (ref 4.22–5.81)
RDW: 14.6 % (ref 11.5–15.5)
WBC: 10 10*3/uL (ref 4.0–10.5)
nRBC: 0 % (ref 0.0–0.2)

## 2020-02-16 LAB — GLUCOSE, CAPILLARY
Glucose-Capillary: 109 mg/dL — ABNORMAL HIGH (ref 70–99)
Glucose-Capillary: 87 mg/dL (ref 70–99)

## 2020-02-16 LAB — PROTIME-INR
INR: 1.1 (ref 0.8–1.2)
Prothrombin Time: 13.6 seconds (ref 11.4–15.2)

## 2020-02-16 MED ORDER — MIDAZOLAM HCL 2 MG/2ML IJ SOLN
INTRAMUSCULAR | Status: AC | PRN
  Administered 2020-02-16: 1 mg via INTRAVENOUS

## 2020-02-16 MED ORDER — FENTANYL CITRATE (PF) 100 MCG/2ML IJ SOLN
INTRAMUSCULAR | Status: AC
  Filled 2020-02-16: qty 2

## 2020-02-16 MED ORDER — FENTANYL CITRATE (PF) 100 MCG/2ML IJ SOLN
INTRAMUSCULAR | Status: AC | PRN
  Administered 2020-02-16: 50 ug via INTRAVENOUS

## 2020-02-16 MED ORDER — GELATIN ABSORBABLE 12-7 MM EX MISC
CUTANEOUS | Status: AC
  Filled 2020-02-16: qty 1

## 2020-02-16 MED ORDER — LIDOCAINE HCL (PF) 1 % IJ SOLN
INTRAMUSCULAR | Status: AC
  Filled 2020-02-16: qty 30

## 2020-02-16 MED ORDER — MIDAZOLAM HCL 2 MG/2ML IJ SOLN
INTRAMUSCULAR | Status: AC
  Filled 2020-02-16: qty 2

## 2020-02-16 MED ORDER — SODIUM CHLORIDE 0.9 % IV SOLN: INTRAVENOUS | Status: DC

## 2020-02-16 NOTE — Procedures (Signed)
Interventional Radiology Procedure Note  Procedure: US guided liver mass biopsy. Mx 18g core biopsy. .  Complications: None  Recommendations:  - Ok to shower tomorrow - Do not submerge for 7 days - Routine care   Signed,  Dulcy Fanny. Earleen Newport, DO

## 2020-02-16 NOTE — Discharge Instructions (Signed)

## 2020-02-16 NOTE — H&P (Signed)
Chief Complaint: Patient was seen in consultation today for liver lesions/biopsy.  Referring Physician(s): Volanda Napoleon  Supervising Physician: Corrie Mckusick  Patient Status: [facility] - Out-pt  History of Present Illness: Bruce Gray is a 79 y.o. male with a past medical history of hypertension, high cholesterol, CAD, renal insufficiency, prostate cancer, diabetes mellitus type II, gout, and OSA. His PCP noted an enlarged liver and elevated alkaline phosphatase during his annual visit. She ordered appropriate imaging scans for further evaluation which unfortunately revealed multiple liver lesions and a pancreatic tail mass consistent with adenocarcinoma of pancreas with metastatic involvement. He was then referred to oncology for further management.  US abdomen RUQ 01/21/2020: 1. Multiple round echogenic lesions throughout the liver are indeterminate. Cannot exclude hepatic metastasis.  2. Larger rounded isoechoic mass is also indeterminate. 3. Recommend CT or MRI with contrast for further evaluation. MRI with and without contrast preferred.  MR abdomen 01/27/2020: 1. 3.9 x 2.7 cm pancreatic tail mass most consistent with adenocarcinoma of the pancreas. 2. Associated celiac axis, portal and gastrohepatic ligament adenopathy and diffuse hepatic metastatic disease. 3. 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  IR requested by Dr. Marin Olp for possible image-guided liver lesion biopsy for tissue diagnosis. Patient awake and alert laying in bed with no complaints at this time. Denies fever, chills, chest pain, dyspnea, abdominal pain, or headache.   Past Medical History:  Diagnosis Date  . CAD (coronary artery disease)   . Complex sleep apnea syndrome    does not use a cpap-cannot  . Diabetes mellitus ([place])    type 2  . Gout   . Heart disease   . High cholesterol   . HOH (hard of hearing)   . Hypertension   .  Liver disease   . Mild renal insufficiency   . Prostate cancer ([place])    seed implantation    Past Surgical History:  Procedure Laterality Date  . CARDIAC CATHETERIZATION  86,2010  . CARDIOVASCULAR STRESS TEST  2008  . CHOLECYSTECTOMY  2011  . COLONOSCOPY    . CORONARY ARTERY BYPASS GRAFT  1986  . DOPPLER ECHOCARDIOGRAPHY     mild concentric LVH with mild grade 1 diastolic dysfunction with normal tissue doppler parameters  . HEMORRHOID SURGERY  2007  . INSERTION PROSTATE RADIATION SEED  2013  . KNEE ARTHROSCOPY WITH MEDIAL MENISECTOMY Left 08/20/2014   Procedure: LEFT KNEE ARTHROSCOPY WITH DEBRIDEMENT/SHAVING (CHONDROPLASTY), MEDIAL MENISECTOMY, EXCISION OF PLICA;  Surgeon: Ninetta Lights, MD;  Location: [facility];  Service: Orthopedics;  Laterality: Left;  . TONSILLECTOMY      Allergies: Codeine and Lansoprazole  Medications: Prior to Admission medications   Medication Sig Start Date End Date Taking? Authorizing Provider  acetaminophen (TYLENOL) 500 MG tablet Take 500 mg by mouth daily as needed for moderate pain or headache.   Yes [provider]  allopurinol (ZYLOPRIM) 100 MG tablet Take 100 mg by mouth 2 (two) times daily.  07/01/13  Yes [provider]  atorvastatin (LIPITOR) 80 MG tablet TAKE 1 TABLET EVERY DAY  AT  6PM KEEP UPCOMING APPT Patient taking differently: Take 80 mg by mouth every evening.  12/16/19  Yes Troy Sine, MD  carvedilol (COREG) 6.25 MG tablet Take 1 tablet (6.25 mg total) by mouth 2 (two) times daily with a meal. KEEP UPCOMING APPT 12/16/19  Yes Troy Sine, MD  docusate sodium (COLACE) 100 MG capsule Take  100 mg by mouth daily as needed for mild constipation.    Yes [provider]  isosorbide mononitrate (IMDUR) 30 MG 24 hr tablet Take 0.5 tablets (15 mg total) by mouth daily. 12/16/19  Yes Troy Sine, MD  lipase/protease/amylase (CREON) 36000 UNITS CPEP capsule Take 2 capsules (72,000 Units total) by  mouth 3 (three) times daily before meals. 02/15/20  Yes Volanda Napoleon, MD  metFORMIN (GLUCOPHAGE) 500 MG tablet Take 500 mg by mouth every evening.   Yes [provider]  omeprazole (PRILOSEC) 20 MG capsule Take 1 capsule (20 mg total) by mouth daily. Patient taking differently: Take 20 mg by mouth in the morning and at bedtime.  03/26/18  Yes Troy Sine, MD  amLODipine (NORVASC) 5 MG tablet Take 1 tablet (5 mg total) by mouth daily. KEEP UPCOMING APPT Patient not taking: Reported on 02/15/2020 12/16/19   Troy Sine, MD     Family History  Problem Relation Age of Onset  . Cancer - Other Mother        breast  . Stroke Father     Social History   Socioeconomic History  . Marital status: Married    Spouse name: Not on file  . Number of children: 2  . Years of education: 62  . Highest education level: Not on file  Occupational History  . Occupation: retired  Tobacco Use  . Smoking status: Former Smoker    Quit date: 07/03/1958    Years since quitting: 61.6  . Smokeless tobacco: Never Used  Substance and Sexual Activity  . Alcohol use: No    Comment: occasional  . Drug use: No  . Sexual activity: Not on file  Other Topics Concern  . Not on file  Social History Narrative  . Not on file   Social Determinants of Health   Financial Resource Strain:   . Difficulty of Paying Living Expenses: Not on file  Food Insecurity:   . Worried About Charity fundraiser in the Last Year: Not on file  . Ran Out of Food in the Last Year: Not on file  Transportation Needs:   . Lack of Transportation (Medical): Not on file  . Lack of Transportation (Non-Medical): Not on file  Physical Activity:   . Days of Exercise per Week: Not on file  . Minutes of Exercise per Session: Not on file  Stress:   . Feeling of Stress : Not on file  Social Connections:   . Frequency of Communication with Friends and Family: Not on file  . Frequency of Social Gatherings with Friends and Family:  Not on file  . Attends Religious Services: Not on file  . Active Member of Clubs or Organizations: Not on file  . Attends Archivist Meetings: Not on file  . Marital Status: Not on file     Review of Systems: A 12 point ROS discussed and pertinent positives are indicated in the HPI above.  All other systems are negative.  Review of Systems  Constitutional: Negative for chills and fever.  Respiratory: Negative for shortness of breath and wheezing.   Cardiovascular: Negative for chest pain and palpitations.  Gastrointestinal: Negative for abdominal pain.  Neurological: Negative for headaches.  Psychiatric/Behavioral: Negative for behavioral problems and confusion.    Vital Signs: BP (!) 164/90   Pulse 65   Temp 98.4 F (36.9 C) (Oral)   Ht 5\' 5"  (1.651 m)   Wt 138 lb (62.6 kg)  SpO2 98%   BMI 22.96 kg/m   Physical Exam Vitals and nursing note reviewed.  Constitutional:      General: He is not in acute distress.    Appearance: Normal appearance.  Cardiovascular:     Rate and Rhythm: Normal rate and regular rhythm.     Heart sounds: Normal heart sounds. No murmur.  Pulmonary:     Effort: Pulmonary effort is normal. No respiratory distress.     Breath sounds: Normal breath sounds. No wheezing.  Skin:    General: Skin is warm and dry.  Neurological:     Mental Status: He is alert and oriented to person, place, and time.  Psychiatric:        Mood and Affect: Mood normal.        Behavior: Behavior normal.      MD Evaluation Airway: WNL Heart: WNL Abdomen: WNL Chest/ Lungs: WNL ASA  Classification: 3 Mallampati/Airway Score: One   Imaging: CT Chest Wo Contrast  Result Date: 02/11/2020 CLINICAL DATA:  New diagnosis of pancreatic cancer metastatic to the liver. Chest staging. Cough and weight loss. Additional history of prostate cancer. EXAM: CT CHEST WITHOUT CONTRAST TECHNIQUE: Multidetector CT imaging of the chest was performed following the standard  protocol without IV contrast. COMPARISON:  01/27/2020 MRI abdomen. 10/03/2006 chest radiograph. FINDINGS: Cardiovascular: Borderline mild cardiomegaly. No significant pericardial effusion/thickening. Three-vessel coronary atherosclerosis status post CABG. Atherosclerotic nonaneurysmal thoracic aorta. Normal caliber pulmonary arteries. Mediastinum/Nodes: No discrete thyroid nodules. Unremarkable esophagus. No pathologically enlarged axillary, mediastinal or hilar lymph nodes, noting limited sensitivity for the detection of hilar adenopathy on this noncontrast study. Lungs/Pleura: No pneumothorax. No pleural effusion. Irregular solid 1.8 x 1.3 cm posterior right upper lobe pulmonary nodule (series 8/image 61) with surrounding ground-glass opacity and with a tiny internal calcification, with suggestion of a branching configuration peripherally. No additional significant pulmonary nodules. Upper abdomen: Trace perihepatic ascites. Known liver lesions and known pancreatic tail mass are indistinct on this noncontrast CT. Cholecystectomy. Musculoskeletal: No aggressive appearing focal osseous lesions. Discontinuity in the lower most sternotomy wire. Moderate thoracic spondylosis. IMPRESSION: 1. Indeterminate irregular solid 1.8 cm posterior right upper lobe pulmonary nodule with surrounding ground-glass opacity and branching configuration peripherally. Differential includes isolated pulmonary metastasis, primary bronchogenic carcinoma or bronchocele. PET-CT may be helpful for further characterization as clinically warranted. 2. No thoracic adenopathy or additional potential findings of metastatic disease in the chest. 3. Borderline mild cardiomegaly. 4. Trace perihepatic ascites. 5. Known liver lesions and known pancreatic tail mass are indistinct on this noncontrast CT, please refer to recent MRI abdomen study for details. 6. Aortic Atherosclerosis (ICD10-I70.0). Electronically Signed   By: Ilona Sorrel M.D.   On:  02/11/2020 14:55   MR ABDOMEN WWO CONTRAST  Result Date: 01/27/2020 CLINICAL DATA:  Evaluate liver lesions seen on recent ultrasound examination. EXAM: MRI ABDOMEN WITHOUT AND WITH CONTRAST TECHNIQUE: Multiplanar multisequence MR imaging of the abdomen was performed both before and after the administration of intravenous contrast. CONTRAST:  7.79mL GADAVIST GADOBUTROL 1 MMOL/ML IV SOLN COMPARISON:  Ultrasound 01/21/2020 FINDINGS: Lower chest: The lung bases are grossly clear. No obvious pulmonary nodules. No pleural or pericardial effusion. Hepatobiliary: Significant perfusion abnormality involving liver due to partial portal vein occlusion and preferential arterial blood flow. There are numerous hepatic metastatic lesions involving both lobes of the liver. The largest lesion is in segment 3 and measures a maximum of 3.5 cm. There is also a 2.3 cm lesion in segment 4A, a 10 mm lesion in  segment 7 laterally and a 10 mm lesion in segment 7 medially. Numerous other smaller lesions are noted. This correlates with the ultrasound examination. Pancreas: There is a mass in the pancreatic tail measuring approximately 3.9 x 2.7 cm and likely representing the patient's primary neoplasm. There is also an enlarged celiac axis lymph node measuring 17 mm. An adjacent portal lymph node measures 10.5 mm. Spleen: Normal size. No lesions. Suspect splenic vein occlusion. Perisplenic collateral vasculature noted. Adrenals/Urinary Tract: The adrenal glands and kidneys are unremarkable. Small renal cysts are noted. Stomach/Bowel: The stomach, duodenum, visualized small bowel and visualized colon are grossly normal. Vascular/Lymphatic: The aorta is normal in caliber. The branch vessels are patent. Moderate atherosclerotic calcifications are noted. 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 Celiac axis and portal lymphadenopathy. There is also a 10 mm gastrohepatic ligament lymph node. No retroperitoneal  adenopathy. Other:  No ascites or abdominal wall hernia. Musculoskeletal: No significant bony findings. IMPRESSION: 1. 3.9 x 2.7 cm pancreatic tail mass most consistent with adenocarcinoma of the pancreas. 2. Associated celiac axis, portal and gastrohepatic ligament adenopathy and diffuse hepatic metastatic disease. 3. 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 Electronically Signed   By: Marijo Sanes M.D.   On: 01/27/2020 15:47   US Abdomen Limited RUQ  Result Date: 01/21/2020 CLINICAL DATA:  Hepatomegaly lesions range in size from 5-15 mm. EXAM: ULTRASOUND ABDOMEN LIMITED RIGHT UPPER QUADRANT COMPARISON:  None. FINDINGS: Gallbladder: Postcholecystectomy Common bile duct: Diameter: Upper limits of normal at 5 mm Liver: Multiple round echogenic lesions throughout the LEFT and RIGHT hepatic lobe. Approximately 12 lesions ranging size from 5-15 mm. One larger round lesion which is more isoechoic to liver parenchyma and measures 4.0 x 4.5 cm. Portal vein is patent on color Doppler imaging with normal direction of blood flow towards the liver. Other: No ascites IMPRESSION: Multiple round echogenic lesions throughout the liver are indeterminate. Cannot exclude hepatic metastasis. Larger rounded isoechoic mass is also indeterminate. Recommend CT or MRI with contrast for further evaluation. MRI with and without contrast preferred. These results will be called to the ordering clinician or representative by the Radiologist Assistant, and communication documented in the PACS or zVision Dashboard. Electronically Signed   By: Suzy Bouchard M.D.   On: 01/21/2020 15:04    Labs:  CBC: Recent Labs    02/05/20 1204 02/16/20 1126  WBC 11.9* 10.0  HGB 13.7 12.5*  HCT 41.4 38.4*  PLT 181 164    COAGS: Recent Labs    02/16/20 1126  INR 1.1    BMP: Recent Labs    02/05/20 1204  NA 141  K 4.1  CL 106  CO2 25  GLUCOSE 110*  BUN 23    CALCIUM 10.1  CREATININE 1.27*  GFRNONAA 53*  GFRAA >60    LIVER FUNCTION TESTS: Recent Labs    02/05/20 1204  BILITOT 0.8  AST 38  ALT 24  ALKPHOS 190*  PROT 7.3  ALBUMIN 4.4     Assessment and Plan:  Liver lesions. Plan for image-guided liver lesion biopsy today in IR. Patient is NPO. Afebrile and WBCs WNL. He does not take blood thinners. INR 1.1 today.  Risks and benefits discussed with the patient including, but not limited to bleeding, infection, damage to adjacent structures or low yield requiring additional tests. All of the patient's questions were answered, patient is agreeable to proceed. Consent signed and in chart.   Thank  you for this interesting consult.  I greatly enjoyed meeting TERREON GELDER and look forward to participating in their care.  A copy of this report was sent to the requesting provider on this date.  Electronically Signed: Earley Abide, PA-C 02/16/2020, 12:09 PM   I spent a total of 40 Minutes in face to face in clinical consultation, greater than 50% of which was counseling/coordinating care for liver lesions/biopsy.

## 2020-02-18 ENCOUNTER — Encounter: Payer: Self-pay | Admitting: *Deleted

## 2020-02-18 LAB — SURGICAL PATHOLOGY

## 2020-02-18 NOTE — Progress Notes (Signed)
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 Dr Marin Olp would like to see patient next week.   Spoke with and gave follow up appointments, time, date and location.

## 2020-02-25 ENCOUNTER — Inpatient Hospital Stay: Payer: Medicare HMO

## 2020-02-25 ENCOUNTER — Encounter: Payer: Self-pay | Admitting: *Deleted

## 2020-02-25 ENCOUNTER — Encounter: Payer: Self-pay | Admitting: Hematology & Oncology

## 2020-02-25 ENCOUNTER — Other Ambulatory Visit: Payer: Self-pay

## 2020-02-25 ENCOUNTER — Inpatient Hospital Stay: Payer: Medicare HMO | Attending: Hematology & Oncology | Admitting: Hematology & Oncology

## 2020-02-25 ENCOUNTER — Other Ambulatory Visit: Payer: Self-pay | Admitting: *Deleted

## 2020-02-25 DIAGNOSIS — C259 Malignant neoplasm of pancreas, unspecified: Secondary | ICD-10-CM

## 2020-02-25 DIAGNOSIS — R197 Diarrhea, unspecified: Secondary | ICD-10-CM | POA: Diagnosis not present

## 2020-02-25 DIAGNOSIS — C799 Secondary malignant neoplasm of unspecified site: Secondary | ICD-10-CM

## 2020-02-25 DIAGNOSIS — Z7984 Long term (current) use of oral hypoglycemic drugs: Secondary | ICD-10-CM | POA: Diagnosis not present

## 2020-02-25 DIAGNOSIS — Z79899 Other long term (current) drug therapy: Secondary | ICD-10-CM | POA: Insufficient documentation

## 2020-02-25 DIAGNOSIS — C7B02 Secondary carcinoid tumors of liver: Secondary | ICD-10-CM | POA: Insufficient documentation

## 2020-02-25 DIAGNOSIS — D3A098 Benign carcinoid tumors of other sites: Secondary | ICD-10-CM

## 2020-02-25 DIAGNOSIS — C7A098 Malignant carcinoid tumors of other sites: Secondary | ICD-10-CM | POA: Insufficient documentation

## 2020-02-25 HISTORY — DX: Benign carcinoid tumors of other sites: D3A.098

## 2020-02-25 NOTE — Progress Notes (Signed)
Hematology and Oncology Follow Up Visit  Bruce Gray [id number] 21-Nov-1941 79 y.o. 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  Shockingly, we found that what he has is actually a well differentiated neuroendocrine carcinoma.  I would have to say this is probably synonymous with carcinoid.  The biopsy was done on 02/16/2020.  The pathology report [id number]) showed a well differentiated neuroendocrine tumor.  It was grade 1.  Again, I would have to believe this is a carcinoid.  The perforation index was less than 3%.  The mitotic rate was less than 2 mitoses per 2 mm  I think that some of the diarrhea that he is having might be from the carcinoid.  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  We will have to get him started on Somatuline.  I think this is very reasonable.  We tried him on Creon.  He really cannot tolerate Creon.  He has had difficulty with diarrhea.  I told him to make sure he tries Imodium.  We also could consider Lomotil if necessary.  I do realize that there is a newer medication out for diarrhea for carcinoid syndrome.  This is called Payton Doughty (telotristat).  This is dosed at 250 mg p.o. 3 times daily.  We can certainly use this if the Somatuline does not control it.  He has had no fever.  There is been no bleeding.  He has had no actual nausea or vomiting.  Overall, I would say his performance status is ECOG 2.  Medications:  Current Outpatient Medications:  .  acetaminophen (TYLENOL) 500 MG tablet, Take 500 mg by mouth daily as needed for moderate pain or headache., Disp: , Rfl:  .  allopurinol (ZYLOPRIM) 100 MG tablet, Take 100 mg by mouth 2 (two) times daily. , Disp: , Rfl:  .  atorvastatin (LIPITOR) 80 MG tablet, TAKE 1 TABLET EVERY DAY  AT  6PM KEEP UPCOMING APPT  (Patient taking differently: Take 80 mg by mouth every evening. ), Disp: 90 tablet, Rfl: 0 .  carvedilol (COREG) 6.25 MG tablet, Take 1 tablet (6.25 mg total) by mouth 2 (two) times daily with a meal. KEEP UPCOMING APPT, Disp: 180 tablet, Rfl: 0 .  docusate sodium (COLACE) 100 MG capsule, Take 100 mg by mouth daily as needed for mild constipation. , Disp: , Rfl:  .  isosorbide mononitrate (IMDUR) 30 MG 24 hr tablet, Take 0.5 tablets (15 mg total) by mouth daily., Disp: 45 tablet, Rfl: 1 .  lipase/protease/amylase (CREON) 36000 UNITS CPEP capsule, Take 2 capsules (72,000 Units total) by mouth 3 (three) times daily before meals., Disp: 180 capsule, Rfl: 11 .  metFORMIN (GLUCOPHAGE) 500 MG tablet, Take 500 mg by mouth every evening., Disp: , Rfl:  .  omeprazole (PRILOSEC) 20 MG capsule, Take 1 capsule (20 mg total) by mouth daily. (Patient taking differently: Take 20 mg by mouth in the morning and at bedtime. ), Disp: 90 capsule, Rfl: 3  Allergies:  Allergies  Allergen Reactions  . Codeine Nausea And Vomiting  . Lansoprazole Diarrhea and Nausea And Vomiting    Past Medical History, Surgical history, Social history, and Family History were reviewed and updated.  Review of Systems: Review of Systems  Constitutional: Negative.   HENT:  Negative.   Eyes: Negative.  Respiratory: Negative.   Cardiovascular: Negative.   Gastrointestinal: Positive for diarrhea.  Endocrine: Negative.   Genitourinary: Negative.    Musculoskeletal: Negative.   Skin: Negative.   Neurological: Negative.   Hematological: Negative.   Psychiatric/Behavioral: Negative.     Physical Exam:  height is 5\' 5"  (1.651 m) and weight is 136 lb (61.7 kg). His temporal temperature is 97.1 F (36.2 C) (abnormal). His blood pressure is 150/71 (abnormal) and his pulse is 69. His respiration is 18 and oxygen saturation is 99%.   Wt Readings from Last 3 Encounters:  02/25/20 136 lb (61.7 kg)  02/16/20 138 lb (62.6 kg)    02/05/20 143 lb (64.9 kg)    Physical Exam Vitals reviewed.  HENT:     Head: Normocephalic and atraumatic.  Eyes:     Pupils: Pupils are equal, round, and reactive to light.  Cardiovascular:     Rate and Rhythm: Normal rate and regular rhythm.     Heart sounds: Normal heart sounds.  Pulmonary:     Effort: Pulmonary effort is normal.     Breath sounds: Normal breath sounds.  Abdominal:     General: Bowel sounds are normal.     Palpations: Abdomen is soft.  Musculoskeletal:        General: No tenderness or deformity. Normal range of motion.     Cervical back: Normal range of motion.  Lymphadenopathy:     Cervical: No cervical adenopathy.  Skin:    General: Skin is warm and dry.     Findings: No erythema or rash.  Neurological:     Mental Status: He is alert and oriented to person, place, and time.  Psychiatric:        Behavior: Behavior normal.        Thought Content: Thought content normal.        Judgment: Judgment normal.      Lab Results  Component Value Date   WBC 10.0 02/16/2020   HGB 12.5 (L) 02/16/2020   HCT 38.4 (L) 02/16/2020   MCV 101.3 (H) 02/16/2020   PLT 164 02/16/2020     Chemistry      Component Value Date/Time   NA 141 02/05/2020 1204   NA 139 02/03/2018 0843   K 4.1 02/05/2020 1204   CL 106 02/05/2020 1204   CO2 25 02/05/2020 1204   BUN 23 02/05/2020 1204   BUN 22 02/03/2018 0843   CREATININE 1.27 (H) 02/05/2020 1204      Component Value Date/Time   CALCIUM 10.1 02/05/2020 1204   ALKPHOS 190 (H) 02/05/2020 1204   AST 38 02/05/2020 1204   ALT 24 02/05/2020 1204   BILITOT 0.8 02/05/2020 1204       Impression and Plan: Bruce Gray is a very nice 79 year old white male.  He has metastatic carcinoid.  We will try him on Somatuline.  I think this is very reasonable.  I would like to hope that this will help with the diarrhea.  We will have to see what his Chromogranin A level is.  I probably am going to want to try to get a serotonin  level on him.  We may want him to do a 5-HIAA urinary level.  I spent about 45 minutes with he and his wife.  I reviewed the pathology report.  I went over the diagnosis of carcinoid.  I explained what a carcinoid was.  My goal is to make sure that he lives with this and not passed away from  it.  Thankfully, there are a lot of options that we have for treatment.  However, I think Somatuline would definitely be the way to go.  We will try to get special I started next week.  We will then see about getting him back in April for his second dose.  I probably would give him 4 months of Somatuline before I would repeat a scan.  We might want to consider a nuclear medicine type scan to target the carcinoid cells.   Volanda Napoleon, MD 3/18/20213:32 PM

## 2020-02-25 NOTE — Progress Notes (Signed)
Patient and wife here for follow up. Assistance for Creon unsuccessful. He states he's been taking the supply he has without improvement in symptoms. He will review with Dr Marin Olp to determine if he needs to stay on them or not. He has been losing weight and asked about supplement. I gave him an assorted supply of Boost and Ensure samples so he could try the different brand and flavors to see if there was one he preferred. Also placed a nutrition consult for next week.  Gave patient and wife folder of patient education on Neuroendocrine Cancer since his diagnosis was unknown at last visit.   Plan is for patient to come back next week to start treatment.

## 2020-02-25 NOTE — Progress Notes (Signed)
START ON PATHWAY REGIMEN - Neuroendocrine     A cycle is every 28 days:     Lanreotide   **Always confirm dose/schedule in your pharmacy ordering system**  Patient Characteristics: Pancreas, Well-differentiated, Low-grade or Intermediate-grade, Unresectable, Treatment Indicated, First Line Tumor Location: Pancreas AJCC M Category: M1b AJCC 8 Stage Grouping: IV AJCC T Category: T2 AJCC N Category: N1 Tumor Grade: Low-grade Line of therapy: First Line  Intent of Therapy: Non-Curative / Palliative Intent, Discussed with Patient

## 2020-02-26 ENCOUNTER — Other Ambulatory Visit: Payer: Self-pay | Admitting: Pharmacist

## 2020-02-26 ENCOUNTER — Other Ambulatory Visit: Payer: Self-pay | Admitting: *Deleted

## 2020-02-26 ENCOUNTER — Encounter: Payer: Self-pay | Admitting: *Deleted

## 2020-02-26 LAB — CHROMOGRANIN A: Chromogranin A (ng/mL): 2047 ng/mL — ABNORMAL HIGH (ref 0.0–101.8)

## 2020-02-26 NOTE — Progress Notes (Signed)
Patient's first treatment scheduled for 3/25. Called and spoke to patient's wife Gatha Mayer. She has seen his new appointments on MyChart. Sh confirms that the appointments work for their schedule. She also wanted to confirm that the nutrition appointment was over the phone, which it is.   She has no questions or concerns at this time. She knows to call the office with any needs.

## 2020-03-01 ENCOUNTER — Inpatient Hospital Stay: Payer: Medicare HMO | Admitting: Nutrition

## 2020-03-01 NOTE — Progress Notes (Signed)
Nutrition consult completed over the telephone. 79 year old male diagnosed with pancreas cancer with liver metastasis. He is a patient of Dr. Marin Olp.  PMH includes Prostate cancer, mild RI, HTN, HOH, hypercholesterolemia, heart disease, Gout, DM, and CAD.  Medications include Glucophage and Prilosec.  Labs reviewed.  Height: 5'5". Weight: 136 pounds on March 18. UBW: 158 pounds in June 2020. BMI:22.63.  Spoke with both patient and wife. Patient reports 25 pound weight loss. Reports poor appetite, early satiety, occasional nausea, and diarrhea. He is drinking one oral nutrition supplement daily. Consumes cereal or eggs and toast at breakfast, meat and vegetables at lunch and soup or macaroni and cheese at dinner. He drinks whole milk. Reports about 4-5 loose stools and is using immodium after each BM.  Nutrition Diagnosis:Unintentional weight loss related to new cancer diagnosis as evidenced by 14% weight loss from usual body weight.  Intervention: Educated on strategies for small, frequent meals and snacks. Encouraged high calorie, high protein foods. Increase Ensure Enlive to twice daily between meals. Recommended nausea medicine as needed. Educated on foods to improve diarrhea. Provided fortified milk recipe. Emailed fact sheets with contact information. Questions answered and teach back method used.  Monitoring, Evaluation, Goals: Patient will tolerate increased calories and protein to minimize further weight loss.  Next Visit: Patient will contact me for questions.

## 2020-03-02 ENCOUNTER — Encounter: Payer: Self-pay | Admitting: *Deleted

## 2020-03-03 ENCOUNTER — Inpatient Hospital Stay: Payer: Medicare HMO

## 2020-03-03 ENCOUNTER — Other Ambulatory Visit: Payer: Self-pay

## 2020-03-03 ENCOUNTER — Encounter: Payer: Self-pay | Admitting: *Deleted

## 2020-03-03 ENCOUNTER — Other Ambulatory Visit: Payer: Medicare HMO

## 2020-03-03 ENCOUNTER — Encounter: Payer: Self-pay | Admitting: Nutrition

## 2020-03-03 VITALS — BP 168/72 | HR 66 | Temp 97.3°F | Resp 18

## 2020-03-03 DIAGNOSIS — D3A098 Benign carcinoid tumors of other sites: Secondary | ICD-10-CM

## 2020-03-03 DIAGNOSIS — C7B02 Secondary carcinoid tumors of liver: Secondary | ICD-10-CM | POA: Diagnosis not present

## 2020-03-03 DIAGNOSIS — R197 Diarrhea, unspecified: Secondary | ICD-10-CM | POA: Diagnosis not present

## 2020-03-03 DIAGNOSIS — Z79899 Other long term (current) drug therapy: Secondary | ICD-10-CM | POA: Diagnosis not present

## 2020-03-03 DIAGNOSIS — Z7984 Long term (current) use of oral hypoglycemic drugs: Secondary | ICD-10-CM | POA: Diagnosis not present

## 2020-03-03 DIAGNOSIS — C7A098 Malignant carcinoid tumors of other sites: Secondary | ICD-10-CM | POA: Diagnosis not present

## 2020-03-03 MED ORDER — LANREOTIDE ACETATE 120 MG/0.5ML ~~LOC~~ SOLN
120.0000 mg | Freq: Once | SUBCUTANEOUS | Status: AC
  Administered 2020-03-03: 120 mg via SUBCUTANEOUS

## 2020-03-03 MED ORDER — LANREOTIDE ACETATE 120 MG/0.5ML ~~LOC~~ SOLN
SUBCUTANEOUS | Status: AC
  Filled 2020-03-03: qty 120

## 2020-03-03 NOTE — Progress Notes (Signed)
Patient here for initiation of somatuline treatment. He is ready to start and has no questions.   He did request a case of Ensure per his discussion with the nutritionist earlier in the week. One case given, and [employer] notified.

## 2020-03-03 NOTE — Progress Notes (Signed)
Patient was given 1 complementary case of Ensure Enlive. 

## 2020-03-11 ENCOUNTER — Encounter: Payer: Self-pay | Admitting: *Deleted

## 2020-03-16 ENCOUNTER — Encounter: Payer: Self-pay | Admitting: *Deleted

## 2020-03-16 ENCOUNTER — Other Ambulatory Visit: Payer: Self-pay

## 2020-03-16 MED ORDER — OMEPRAZOLE 20 MG PO CPDR: 20.0000 mg | DELAYED_RELEASE_CAPSULE | Freq: Two times a day (BID) | ORAL | 3 refills | Status: DC

## 2020-03-17 ENCOUNTER — Other Ambulatory Visit: Payer: Self-pay | Admitting: Cardiovascular Disease

## 2020-03-17 ENCOUNTER — Encounter: Payer: Self-pay | Admitting: *Deleted

## 2020-03-17 NOTE — Progress Notes (Signed)
Received a call from the wife stating that they have received an EOB and it shows that their out of pocket costs for his lanreotide is over $1200. She states they cannot afford that.   Message sent to our insurance specialist and she will look into the patient's out of pocket costs, as well as potential for financial assistance.   Per the insurance specialist: For the copay to be reduced, they will have to administer the medication at home. They are unwilling to consider this. Information on PAN and [facility] was provided. They will meet with Baxter Flattery on 03/31/20.

## 2020-03-21 ENCOUNTER — Other Ambulatory Visit: Payer: Self-pay | Admitting: *Deleted

## 2020-03-21 MED ORDER — OMEPRAZOLE 20 MG PO CPDR: 20.0000 mg | DELAYED_RELEASE_CAPSULE | Freq: Two times a day (BID) | ORAL | 3 refills | Status: DC

## 2020-03-23 ENCOUNTER — Other Ambulatory Visit: Payer: Self-pay | Admitting: *Deleted

## 2020-03-23 ENCOUNTER — Encounter: Payer: Self-pay | Admitting: *Deleted

## 2020-03-23 NOTE — Telephone Encounter (Signed)
Prescription for Somatuline signed and given to [employer]. [place] for financial assistance

## 2020-03-29 ENCOUNTER — Encounter: Payer: Self-pay | Admitting: *Deleted

## 2020-03-29 NOTE — Progress Notes (Signed)
Received a call from [facility] at [facility] with patient's wife included in the call. They are reaching out to make sure we have received all required documentation for application for their free drug program for the Somatuline. Also requesting contact information for who would be the office contact to arrange for receipt of the free drug. I didn't have that information on hand, so I stated I would call them back.  Spoke to [employer], our Patent attorney and she stated she would call Hope back regarding any needed paperwork. Asked her to also provide hope with Vivia Ewing D name and number as the office contact. She agreed.  Spoke to patient's wife regarding his appointment this week. We will not have paperwork completed or free drug on hand by this Thursday. Appointment will be pushed back one week to allow time for processing. Message sent to scheduler.   Contact: Hope [phone]

## 2020-03-31 ENCOUNTER — Inpatient Hospital Stay: Payer: Medicare HMO

## 2020-03-31 ENCOUNTER — Inpatient Hospital Stay: Payer: Medicare HMO | Admitting: Hematology & Oncology

## 2020-03-31 ENCOUNTER — Encounter: Payer: Self-pay | Admitting: *Deleted

## 2020-03-31 NOTE — Progress Notes (Unsigned)
Pharmacist Chemotherapy Monitoring - Follow Up Assessment    I verify that I have reviewed each item in the below checklist:  . Regimen for the patient is scheduled for the appropriate day and plan matches scheduled date. Marland Kitchen Appropriate non-routine labs are ordered dependent on drug ordered. . If applicable, additional medications reviewed and ordered per protocol based on lifetime cumulative doses and/or treatment regimen.   Plan for follow-up and/or issues identified: Yes . I-vent associated with next due treatment: Yes . MD and/or nursing notified: Yes  Bruce Gray, Bruce Gray 03/31/2020 8:31 AM

## 2020-04-06 ENCOUNTER — Other Ambulatory Visit: Payer: Self-pay

## 2020-04-06 ENCOUNTER — Encounter: Payer: Self-pay | Admitting: *Deleted

## 2020-04-06 MED ORDER — CARVEDILOL 6.25 MG PO TABS: 6.2500 mg | ORAL_TABLET | Freq: Two times a day (BID) | ORAL | 3 refills | Status: DC

## 2020-04-06 NOTE — Progress Notes (Signed)
See MyChart messaging. Appointment cancelled for tomorrow. Will await word that somatuline has been approved and shipped to our facility before rescheduling.

## 2020-04-07 ENCOUNTER — Inpatient Hospital Stay: Payer: Medicare HMO | Admitting: Hematology & Oncology

## 2020-04-07 ENCOUNTER — Inpatient Hospital Stay: Payer: Medicare HMO

## 2020-04-07 ENCOUNTER — Encounter: Payer: Self-pay | Admitting: *Deleted

## 2020-04-07 NOTE — Progress Notes (Signed)
Received a call from [place], patient's wife, that [facility] still hasn't received completed forms with all patient and MD signatures. Reprinted all forms for both [employer] and their financial aid program and filled out in total. Retrieved signatures from patient and Dr Marin Olp. Forms faxed with a confirmation at 12:55p.  Included my number on the cover sheet as a direct contact for any further needs to complete this application and restart patient on his treatment. Wife and patient appreciative of our efforts.

## 2020-04-08 DIAGNOSIS — Z8546 Personal history of malignant neoplasm of prostate: Secondary | ICD-10-CM | POA: Diagnosis not present

## 2020-04-08 DIAGNOSIS — H269 Unspecified cataract: Secondary | ICD-10-CM | POA: Diagnosis not present

## 2020-04-08 DIAGNOSIS — E1169 Type 2 diabetes mellitus with other specified complication: Secondary | ICD-10-CM | POA: Diagnosis not present

## 2020-04-08 DIAGNOSIS — I10 Essential (primary) hypertension: Secondary | ICD-10-CM | POA: Diagnosis not present

## 2020-04-08 DIAGNOSIS — E119 Type 2 diabetes mellitus without complications: Secondary | ICD-10-CM | POA: Diagnosis not present

## 2020-04-08 DIAGNOSIS — N189 Chronic kidney disease, unspecified: Secondary | ICD-10-CM | POA: Diagnosis not present

## 2020-04-08 DIAGNOSIS — I2510 Atherosclerotic heart disease of native coronary artery without angina pectoris: Secondary | ICD-10-CM | POA: Diagnosis not present

## 2020-04-08 DIAGNOSIS — E7800 Pure hypercholesterolemia, unspecified: Secondary | ICD-10-CM | POA: Diagnosis not present

## 2020-04-08 DIAGNOSIS — E785 Hyperlipidemia, unspecified: Secondary | ICD-10-CM | POA: Diagnosis not present

## 2020-04-12 ENCOUNTER — Encounter: Payer: Self-pay | Admitting: Pharmacy Technician

## 2020-04-12 NOTE — Progress Notes (Signed)
Patient has been approved for drug assistance by [employer] for Lanreotide. The enrollment period is from 04/08/20-12/09/20 based on EOOP. First DOS covered is 04/13/20.

## 2020-04-13 ENCOUNTER — Encounter: Payer: Self-pay | Admitting: Hematology & Oncology

## 2020-04-13 ENCOUNTER — Other Ambulatory Visit: Payer: Self-pay

## 2020-04-13 ENCOUNTER — Inpatient Hospital Stay: Payer: Medicare HMO

## 2020-04-13 ENCOUNTER — Inpatient Hospital Stay: Payer: Medicare HMO | Attending: Hematology & Oncology

## 2020-04-13 ENCOUNTER — Encounter: Payer: Self-pay | Admitting: *Deleted

## 2020-04-13 ENCOUNTER — Inpatient Hospital Stay (HOSPITAL_BASED_OUTPATIENT_CLINIC_OR_DEPARTMENT_OTHER): Payer: Medicare HMO | Admitting: Hematology & Oncology

## 2020-04-13 ENCOUNTER — Telehealth: Payer: Self-pay | Admitting: Hematology & Oncology

## 2020-04-13 VITALS — BP 145/67 | HR 63 | Temp 97.1°F | Resp 20 | Wt 144.4 lb

## 2020-04-13 DIAGNOSIS — C7B02 Secondary carcinoid tumors of liver: Secondary | ICD-10-CM | POA: Diagnosis not present

## 2020-04-13 DIAGNOSIS — C7A098 Malignant carcinoid tumors of other sites: Secondary | ICD-10-CM | POA: Diagnosis not present

## 2020-04-13 DIAGNOSIS — D3A098 Benign carcinoid tumors of other sites: Secondary | ICD-10-CM

## 2020-04-13 DIAGNOSIS — E340 Carcinoid syndrome: Secondary | ICD-10-CM | POA: Diagnosis not present

## 2020-04-13 LAB — CBC WITH DIFFERENTIAL (CANCER CENTER ONLY)
Abs Immature Granulocytes: 0.04 10*3/uL (ref 0.00–0.07)
Basophils Absolute: 0.1 10*3/uL (ref 0.0–0.1)
Basophils Relative: 1 %
Eosinophils Absolute: 1 10*3/uL — ABNORMAL HIGH (ref 0.0–0.5)
Eosinophils Relative: 8 %
HCT: 36 % — ABNORMAL LOW (ref 39.0–52.0)
Hemoglobin: 11.9 g/dL — ABNORMAL LOW (ref 13.0–17.0)
Immature Granulocytes: 0 %
Lymphocytes Relative: 19 %
Lymphs Abs: 2.4 10*3/uL (ref 0.7–4.0)
MCH: 33.2 pg (ref 26.0–34.0)
MCHC: 33.1 g/dL (ref 30.0–36.0)
MCV: 100.6 fL — ABNORMAL HIGH (ref 80.0–100.0)
Monocytes Absolute: 1.4 10*3/uL — ABNORMAL HIGH (ref 0.1–1.0)
Monocytes Relative: 11 %
Neutro Abs: 7.5 10*3/uL (ref 1.7–7.7)
Neutrophils Relative %: 61 %
Platelet Count: 165 10*3/uL (ref 150–400)
RBC: 3.58 MIL/uL — ABNORMAL LOW (ref 4.22–5.81)
RDW: 14.6 % (ref 11.5–15.5)
WBC Count: 12.4 10*3/uL — ABNORMAL HIGH (ref 4.0–10.5)
nRBC: 0 % (ref 0.0–0.2)

## 2020-04-13 LAB — CMP (CANCER CENTER ONLY)
ALT: 21 U/L (ref 0–44)
AST: 44 U/L — ABNORMAL HIGH (ref 15–41)
Albumin: 3.7 g/dL (ref 3.5–5.0)
Alkaline Phosphatase: 248 U/L — ABNORMAL HIGH (ref 38–126)
Anion gap: 7 (ref 5–15)
BUN: 23 mg/dL (ref 8–23)
CO2: 24 mmol/L (ref 22–32)
Calcium: 9.2 mg/dL (ref 8.9–10.3)
Chloride: 108 mmol/L (ref 98–111)
Creatinine: 1.4 mg/dL — ABNORMAL HIGH (ref 0.61–1.24)
GFR, Est AFR Am: 55 mL/min — ABNORMAL LOW (ref >60)
GFR, Estimated: 47 mL/min — ABNORMAL LOW (ref >60)
Glucose, Bld: 128 mg/dL — ABNORMAL HIGH (ref 70–99)
Potassium: 4.4 mmol/L (ref 3.5–5.1)
Sodium: 139 mmol/L (ref 135–145)
Total Bilirubin: 0.8 mg/dL (ref 0.3–1.2)
Total Protein: 6.2 g/dL — ABNORMAL LOW (ref 6.5–8.1)

## 2020-04-13 MED ORDER — LANREOTIDE ACETATE 120 MG/0.5ML ~~LOC~~ SOLN
120.0000 mg | Freq: Once | SUBCUTANEOUS | Status: AC
  Administered 2020-04-13: 120 mg via SUBCUTANEOUS

## 2020-04-13 MED ORDER — LANREOTIDE ACETATE 120 MG/0.5ML ~~LOC~~ SOLN
SUBCUTANEOUS | Status: AC
  Filled 2020-04-13: qty 120

## 2020-04-13 NOTE — Progress Notes (Signed)
Hematology and Oncology Follow Up Visit  Bruce Gray [id number] 1940/12/23 79 y.o. 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 Bruce Gray is back for follow-up. He is doing okay. He did okay with the 1st dose of Somatuline.  He still has little bit of diarrhea. Is not all that bad. As such, I don't think we have to try anything else for the diarrhea. I know that there is a new medication called Payton Doughty that is used for diarrhea secondary to carcinoid.  We will have to see what his chromogranin A level is. While we 1st saw him, the level was just around 2000.  There is been no problems with cough. Is had no shortness of breath. He has had no nausea or vomiting. He has had occasional pain in the right upper quadrant of his abdomen.  There is no bleeding. He has had no leg swelling. He has had no rashes. He has had no headache.  Overall, his performance status is ECOG 1.  Medications:  Current Outpatient Medications:  .  acetaminophen (TYLENOL) 500 MG tablet, Take 500 mg by mouth daily as needed for moderate pain or headache., Disp: , Rfl:  .  allopurinol (ZYLOPRIM) 100 MG tablet, Take 100 mg by mouth 2 (two) times daily. , Disp: , Rfl:  .  atorvastatin (LIPITOR) 80 MG tablet, TAKE 1 TABLET EVERY DAY  AT  6PM.  KEEP UPCOMING APPOINTMENT, Disp: 90 tablet, Rfl: 2 .  carvedilol (COREG) 6.25 MG tablet, Take 1 tablet (6.25 mg total) by mouth 2 (two) times daily with a meal., Disp: 180 tablet, Rfl: 3 .  docusate sodium (COLACE) 100 MG capsule, Take 100 mg by mouth daily as needed for mild constipation. , Disp: , Rfl:  .  isosorbide mononitrate (IMDUR) 30 MG 24 hr tablet, Take 0.5 tablets (15 mg total) by mouth daily., Disp: 45 tablet, Rfl: 1 .  metFORMIN (GLUCOPHAGE) 500 MG tablet, Take 500 mg by mouth every evening., Disp: , Rfl:    .  omeprazole (PRILOSEC) 20 MG capsule, Take 1 capsule (20 mg total) by mouth in the morning and at bedtime., Disp: 180 capsule, Rfl: 3 .  Probiotic Product (PROBIOTIC-10 PO), Take by mouth every other day., Disp: , Rfl:  No current facility-administered medications for this visit.  Facility-Administered Medications Ordered in Other Visits:  .  lanreotide acetate (SOMATULINE DEPOT) injection 120 mg, 120 mg, Subcutaneous, Once, Halcomb, Vilinda Blanks, RPH  Allergies:  Allergies  Allergen Reactions  . Codeine Nausea And Vomiting  . Lansoprazole Diarrhea and Nausea And Vomiting    Past Medical History, Surgical history, Social history, and Family History were reviewed and updated.  Review of Systems: Review of Systems  Constitutional: Negative.   HENT:  Negative.   Eyes: Negative.   Respiratory: Negative.   Cardiovascular: Negative.   Gastrointestinal: Positive for diarrhea.  Endocrine: Negative.   Genitourinary: Negative.    Musculoskeletal: Negative.   Skin: Negative.   Neurological: Negative.   Hematological: Negative.   Psychiatric/Behavioral: Negative.     Physical Exam:  weight is 144 lb 6.4 oz (65.5 kg). His temporal temperature is 97.1 F (36.2 C) (abnormal). His blood pressure is 145/67 (abnormal) and his pulse is 63. His respiration is 20 and oxygen saturation is 98%.   Wt Readings from Last 3  Encounters:  04/13/20 144 lb 6.4 oz (65.5 kg)  02/25/20 136 lb (61.7 kg)  02/16/20 138 lb (62.6 kg)    Physical Exam Vitals reviewed.  HENT:     Head: Normocephalic and atraumatic.  Eyes:     Pupils: Pupils are equal, round, and reactive to light.  Cardiovascular:     Rate and Rhythm: Normal rate and regular rhythm.     Heart sounds: Normal heart sounds.  Pulmonary:     Effort: Pulmonary effort is normal.     Breath sounds: Normal breath sounds.  Abdominal:     General: Bowel sounds are normal.     Palpations: Abdomen is soft.  Musculoskeletal:        General: No  tenderness or deformity. Normal range of motion.     Cervical back: Normal range of motion.  Lymphadenopathy:     Cervical: No cervical adenopathy.  Skin:    General: Skin is warm and dry.     Findings: No erythema or rash.  Neurological:     Mental Status: He is alert and oriented to person, place, and time.  Psychiatric:        Behavior: Behavior normal.        Thought Content: Thought content normal.        Judgment: Judgment normal.      Lab Results  Component Value Date   WBC 12.4 (H) 04/13/2020   HGB 11.9 (L) 04/13/2020   HCT 36.0 (L) 04/13/2020   MCV 100.6 (H) 04/13/2020   PLT 165 04/13/2020     Chemistry      Component Value Date/Time   NA 139 04/13/2020 1314   NA 139 02/03/2018 0843   K 4.4 04/13/2020 1314   CL 108 04/13/2020 1314   CO2 24 04/13/2020 1314   BUN 23 04/13/2020 1314   BUN 22 02/03/2018 0843   CREATININE 1.40 (H) 04/13/2020 1314      Component Value Date/Time   CALCIUM 9.2 04/13/2020 1314   ALKPHOS 248 (H) 04/13/2020 1314   AST 44 (H) 04/13/2020 1314   ALT 21 04/13/2020 1314   BILITOT 0.8 04/13/2020 1314       Impression and Plan: Mr. Bruce Gray is a very nice 79 year old white male.  He has metastatic carcinoid.  I have to believe that the Somatuline will help. He does not have a high-grade carcinoid by the pathology.  He will get his 2nd dose of Somatuline today.  We will have him come back monthly.  It still might be a little bit too early to know how well the Somatuline will help.  I would not plan on any scans for him probably for about 4 months.   Volanda Napoleon, MD 5/5/20212:40 PM

## 2020-04-13 NOTE — Patient Instructions (Signed)
Lanreotide injection What is this medicine? LANREOTIDE (lan REE oh tide) is used to reduce blood levels of growth hormone in patients with a condition called acromegaly. It also works to slow or stop tumor growth in patients with neuroendocrine tumors and treat carcinoid syndrome. This medicine may be used for other purposes; ask your health care provider or pharmacist if you have questions. COMMON BRAND NAME(S): Somatuline [place] What should I tell my health care provider before I take this medicine? They need to know if you have any of these conditions:  diabetes  gallbladder disease  heart disease  kidney disease  liver disease  thyroid disease  an unusual or allergic reaction to lanreotide, other medicines, foods, dyes, or preservatives  pregnant or trying to get pregnant  breast-feeding How should I use this medicine? This medicine is for injection under the skin. It is given by a health care professional in a hospital or clinic setting. Contact your pediatrician or health care professional regarding the use of this medicine in children. Special care may be needed. Overdosage: If you think you have taken too much of this medicine contact a poison control center or emergency room at once. NOTE: This medicine is only for you. Do not share this medicine with others. What if I miss a dose? It is important not to miss your dose. Call your doctor or health care professional if you are unable to keep an appointment. What may interact with this medicine? This medicine may interact with the following medications:  bromocriptine  cyclosporine  certain medicines for blood pressure, heart disease, irregular heart beat  certain medicines for diabetes  quinidine  terfenadine This list may not describe all possible interactions. Give your health care provider a list of all the medicines, herbs, non-prescription drugs, or dietary supplements you use. Also tell them if you smoke,  drink alcohol, or use illegal drugs. Some items may interact with your medicine. What should I watch for while using this medicine? Tell your doctor or healthcare professional if your symptoms do not start to get better or if they get worse. Visit your doctor or health care professional for regular checks on your progress. Your condition will be monitored carefully while you are receiving this medicine. This medicine may increase blood sugar. Ask your healthcare provider if changes in diet or medicines are needed if you have diabetes. You may need blood work done while you are taking this medicine. Women should inform their doctor if they wish to become pregnant or think they might be pregnant. There is a potential for serious side effects to an unborn child. Talk to your health care professional or pharmacist for more information. Do not breast-feed an infant while taking this medicine or for 6 months after stopping it. This medicine has caused ovarian failure in some women. This medicine may interfere with the ability to have a child. Talk with your doctor or health care professional if you are concerned about your fertility. What side effects may I notice from receiving this medicine? Side effects that you should report to your doctor or health care professional as soon as possible:  allergic reactions like skin rash, itching or hives, swelling of the face, lips, or tongue  increased blood pressure  severe stomach pain  signs and symptoms of hgh blood sugar such as being more thirsty or hungry or having to urinate more than normal. You may also feel very tired or have blurry vision.  signs and symptoms of low blood   sugar such as feeling anxious; confusion; dizziness; increased hunger; unusually weak or tired; sweating; shakiness; cold; irritable; headache; blurred vision; fast heartbeat; loss of consciousness  unusually slow heartbeat Side effects that usually do not require medical  attention (report to your doctor or health care professional if they continue or are bothersome):  constipation  diarrhea  dizziness  headache  muscle pain  muscle spasms  nausea  pain, redness, or irritation at site where injected This list may not describe all possible side effects. Call your doctor for medical advice about side effects. You may report side effects to FDA at 1-800-FDA-1088. Where should I keep my medicine? This drug is given in a hospital or clinic and will not be stored at home. NOTE: This sheet is a summary. It may not cover all possible information. If you have questions about this medicine, talk to your doctor, pharmacist, or health care provider.  2020 Elsevier/Gold Standard (2018-09-04 09:13:08)  

## 2020-04-13 NOTE — Telephone Encounter (Signed)
Appointments scheduled calendar printed per 5/5 los 

## 2020-04-14 LAB — CHROMOGRANIN A: Chromogranin A (ng/mL): 1998 ng/mL — ABNORMAL HIGH (ref 0.0–101.8)

## 2020-04-15 ENCOUNTER — Telehealth: Payer: Self-pay | Admitting: *Deleted

## 2020-04-15 NOTE — Telephone Encounter (Signed)
Pt.'s wife notified that "the tumor marker is stable at 2000.  This is no surprise as he has had only 1 cycle of therapy.  Laurey Arrow"  Pt.'s wife appreciative of call and has no questions or concerns at this time.

## 2020-04-15 NOTE — Telephone Encounter (Signed)
-----   Message from Volanda Napoleon, MD sent at 04/14/2020  4:20 PM EDT ----- Call - the tumor marker is stable at 2000. This is no surprise as he has had only 1 cycle of therapy.  Laurey Arrow

## 2020-04-18 ENCOUNTER — Inpatient Hospital Stay: Payer: Medicare HMO

## 2020-04-18 DIAGNOSIS — E340 Carcinoid syndrome: Secondary | ICD-10-CM | POA: Diagnosis not present

## 2020-04-18 DIAGNOSIS — C7A098 Malignant carcinoid tumors of other sites: Secondary | ICD-10-CM | POA: Diagnosis not present

## 2020-04-18 DIAGNOSIS — D3A098 Benign carcinoid tumors of other sites: Secondary | ICD-10-CM

## 2020-04-18 DIAGNOSIS — C7B02 Secondary carcinoid tumors of liver: Secondary | ICD-10-CM | POA: Diagnosis not present

## 2020-04-22 DIAGNOSIS — E785 Hyperlipidemia, unspecified: Secondary | ICD-10-CM | POA: Diagnosis not present

## 2020-04-22 DIAGNOSIS — E119 Type 2 diabetes mellitus without complications: Secondary | ICD-10-CM | POA: Diagnosis not present

## 2020-04-22 DIAGNOSIS — I2510 Atherosclerotic heart disease of native coronary artery without angina pectoris: Secondary | ICD-10-CM | POA: Diagnosis not present

## 2020-04-22 DIAGNOSIS — H269 Unspecified cataract: Secondary | ICD-10-CM | POA: Diagnosis not present

## 2020-04-22 DIAGNOSIS — I10 Essential (primary) hypertension: Secondary | ICD-10-CM | POA: Diagnosis not present

## 2020-04-22 DIAGNOSIS — Z8546 Personal history of malignant neoplasm of prostate: Secondary | ICD-10-CM | POA: Diagnosis not present

## 2020-04-22 DIAGNOSIS — E1169 Type 2 diabetes mellitus with other specified complication: Secondary | ICD-10-CM | POA: Diagnosis not present

## 2020-04-22 DIAGNOSIS — E7800 Pure hypercholesterolemia, unspecified: Secondary | ICD-10-CM | POA: Diagnosis not present

## 2020-04-22 DIAGNOSIS — N189 Chronic kidney disease, unspecified: Secondary | ICD-10-CM | POA: Diagnosis not present

## 2020-04-26 LAB — 5 HIAA, QUANTITATIVE, URINE, 24 HOUR
5-HIAA, Ur: 21.7 mg/L
5-HIAA,Quant.,24 Hr Urine: 16.3 mg/24 hr — ABNORMAL HIGH (ref 0.0–14.9)
Total Volume: 750

## 2020-04-28 ENCOUNTER — Other Ambulatory Visit: Payer: Medicare HMO

## 2020-04-28 ENCOUNTER — Inpatient Hospital Stay: Payer: Medicare HMO

## 2020-05-11 ENCOUNTER — Inpatient Hospital Stay: Payer: Medicare HMO

## 2020-05-11 ENCOUNTER — Encounter: Payer: Self-pay | Admitting: *Deleted

## 2020-05-11 ENCOUNTER — Encounter: Payer: Self-pay | Admitting: Hematology & Oncology

## 2020-05-11 ENCOUNTER — Other Ambulatory Visit: Payer: Self-pay

## 2020-05-11 ENCOUNTER — Inpatient Hospital Stay: Payer: Medicare HMO | Attending: Hematology & Oncology

## 2020-05-11 ENCOUNTER — Inpatient Hospital Stay (HOSPITAL_BASED_OUTPATIENT_CLINIC_OR_DEPARTMENT_OTHER): Payer: Medicare HMO | Admitting: Hematology & Oncology

## 2020-05-11 VITALS — BP 144/67 | HR 69 | Temp 98.7°F | Resp 20 | Wt 146.4 lb

## 2020-05-11 DIAGNOSIS — C7B02 Secondary carcinoid tumors of liver: Secondary | ICD-10-CM | POA: Insufficient documentation

## 2020-05-11 DIAGNOSIS — D3A098 Benign carcinoid tumors of other sites: Secondary | ICD-10-CM | POA: Diagnosis not present

## 2020-05-11 DIAGNOSIS — R140 Abdominal distension (gaseous): Secondary | ICD-10-CM | POA: Insufficient documentation

## 2020-05-11 DIAGNOSIS — C7A098 Malignant carcinoid tumors of other sites: Secondary | ICD-10-CM | POA: Insufficient documentation

## 2020-05-11 DIAGNOSIS — R978 Other abnormal tumor markers: Secondary | ICD-10-CM | POA: Insufficient documentation

## 2020-05-11 DIAGNOSIS — R188 Other ascites: Secondary | ICD-10-CM | POA: Insufficient documentation

## 2020-05-11 LAB — CBC WITH DIFFERENTIAL (CANCER CENTER ONLY)
Abs Immature Granulocytes: 0.04 10*3/uL (ref 0.00–0.07)
Basophils Absolute: 0.1 10*3/uL (ref 0.0–0.1)
Basophils Relative: 1 %
Eosinophils Absolute: 0.9 10*3/uL — ABNORMAL HIGH (ref 0.0–0.5)
Eosinophils Relative: 7 %
HCT: 38 % — ABNORMAL LOW (ref 39.0–52.0)
Hemoglobin: 12.7 g/dL — ABNORMAL LOW (ref 13.0–17.0)
Immature Granulocytes: 0 %
Lymphocytes Relative: 18 %
Lymphs Abs: 2.2 10*3/uL (ref 0.7–4.0)
MCH: 34 pg (ref 26.0–34.0)
MCHC: 33.4 g/dL (ref 30.0–36.0)
MCV: 101.9 fL — ABNORMAL HIGH (ref 80.0–100.0)
Monocytes Absolute: 1.5 10*3/uL — ABNORMAL HIGH (ref 0.1–1.0)
Monocytes Relative: 12 %
Neutro Abs: 7.7 10*3/uL (ref 1.7–7.7)
Neutrophils Relative %: 62 %
Platelet Count: 219 10*3/uL (ref 150–400)
RBC: 3.73 MIL/uL — ABNORMAL LOW (ref 4.22–5.81)
RDW: 14.6 % (ref 11.5–15.5)
WBC Count: 12.5 10*3/uL — ABNORMAL HIGH (ref 4.0–10.5)
nRBC: 0 % (ref 0.0–0.2)

## 2020-05-11 LAB — CMP (CANCER CENTER ONLY)
ALT: 14 U/L (ref 0–44)
AST: 35 U/L (ref 15–41)
Albumin: 3.6 g/dL (ref 3.5–5.0)
Alkaline Phosphatase: 222 U/L — ABNORMAL HIGH (ref 38–126)
Anion gap: 7 (ref 5–15)
BUN: 22 mg/dL (ref 8–23)
CO2: 26 mmol/L (ref 22–32)
Calcium: 9.3 mg/dL (ref 8.9–10.3)
Chloride: 106 mmol/L (ref 98–111)
Creatinine: 1.58 mg/dL — ABNORMAL HIGH (ref 0.61–1.24)
GFR, Est AFR Am: 47 mL/min — ABNORMAL LOW (ref >60)
GFR, Estimated: 41 mL/min — ABNORMAL LOW (ref >60)
Glucose, Bld: 117 mg/dL — ABNORMAL HIGH (ref 70–99)
Potassium: 4.7 mmol/L (ref 3.5–5.1)
Sodium: 139 mmol/L (ref 135–145)
Total Bilirubin: 1.1 mg/dL (ref 0.3–1.2)
Total Protein: 6.5 g/dL (ref 6.5–8.1)

## 2020-05-11 MED ORDER — LANREOTIDE ACETATE 120 MG/0.5ML ~~LOC~~ SOLN
120.0000 mg | Freq: Once | SUBCUTANEOUS | Status: AC
  Administered 2020-05-11: 120 mg via SUBCUTANEOUS

## 2020-05-11 NOTE — Progress Notes (Signed)
Oncology Nurse Navigator Documentation  Oncology Nurse Navigator Flowsheets 05/11/2020  Abnormal Finding Date -  Confirmed Diagnosis Date -  Diagnosis Status -  Planned Course of Treatment -  Phase of Treatment -  Hormonal Therapy Pending- Reason: -  Hormonal Actual Start Date: -  Navigator Follow Up Date: 06/09/2020  Navigator Follow Up Reason: Follow-up Appointment  Navigator Location [facility]  Referral Date to RadOnc/MedOnc -  Navigator Encounter Type Follow-up Appt  Telephone -  Treatment Initiated Date -  Patient Visit Type MedOnc  Treatment Phase Active Tx  Barriers/Navigation Needs No Barriers At This Time  Education -  Interventions Psycho-Social Support  Acuity Level 2-Minimal Needs (1-2 Barriers Identified)  Referrals -  Coordination of Care -  Education Method -  Support Groups/Services Friends and Family  Time Spent with Patient 30

## 2020-05-11 NOTE — Progress Notes (Signed)
Hematology and Oncology Follow Up Visit  Bruce Gray [id number] May 17, 1941 79 y.o. 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  Overall, he seems to be holding his own.  He does have some abdominal distention.  I am not sure if this is just air in the belly or if this is fluid.  I would not think that this would be ascites given that this is a low-grade carcinoid.  His last Chromogranin A level was about 2000 which is stable.  He is still not eating all that much.  His weight is holding stable.  He had a little bit of diarrhea.  This does seem to be improving a little bit.  There has been no issues with nausea or vomiting.  He has had no leg swelling.  There is been no bleeding.  He has had some cough.  I know we have checked this out in the past.  Overall, his performance status is ECOG 1.  Medications:  Current Outpatient Medications:  .  acetaminophen (TYLENOL) 500 MG tablet, Take 500 mg by mouth daily as needed for moderate pain or headache., Disp: , Rfl:  .  allopurinol (ZYLOPRIM) 100 MG tablet, Take 100 mg by mouth 2 (two) times daily. , Disp: , Rfl:  .  atorvastatin (LIPITOR) 80 MG tablet, TAKE 1 TABLET EVERY DAY  AT  6PM.  KEEP UPCOMING APPOINTMENT, Disp: 90 tablet, Rfl: 2 .  carvedilol (COREG) 6.25 MG tablet, Take 1 tablet (6.25 mg total) by mouth 2 (two) times daily with a meal., Disp: 180 tablet, Rfl: 3 .  docusate sodium (COLACE) 100 MG capsule, Take 100 mg by mouth daily as needed for mild constipation. , Disp: , Rfl:  .  isosorbide mononitrate (IMDUR) 30 MG 24 hr tablet, Take 0.5 tablets (15 mg total) by mouth daily., Disp: 45 tablet, Rfl: 1 .  metFORMIN (GLUCOPHAGE) 500 MG tablet, Take 500 mg by mouth every evening., Disp: , Rfl:  .  omeprazole (PRILOSEC) 20 MG capsule,  Take 1 capsule (20 mg total) by mouth in the morning and at bedtime., Disp: 180 capsule, Rfl: 3 .  Probiotic Product (PROBIOTIC-10 PO), Take by mouth every other day., Disp: , Rfl:   Allergies:  Allergies  Allergen Reactions  . Codeine Nausea And Vomiting  . Lansoprazole Diarrhea and Nausea And Vomiting    Past Medical History, Surgical history, Social history, and Family History were reviewed and updated.  Review of Systems: Review of Systems  Constitutional: Negative.   HENT:  Negative.   Eyes: Negative.   Respiratory: Negative.   Cardiovascular: Negative.   Gastrointestinal: Positive for diarrhea.  Endocrine: Negative.   Genitourinary: Negative.    Musculoskeletal: Negative.   Skin: Negative.   Neurological: Negative.   Hematological: Negative.   Psychiatric/Behavioral: Negative.     Physical Exam:  weight is 146 lb 6.4 oz (66.4 kg). His oral temperature is 98.7 F (37.1 C). His blood pressure is 144/67 (abnormal) and his pulse is 69. His respiration is 20 and oxygen saturation is 99%.   Wt Readings from Last 3 Encounters:  05/11/20 146 lb 6.4 oz (66.4 kg)  04/13/20 144 lb 6.4 oz (65.5 kg)  02/25/20 136 lb (61.7 kg)    Physical Exam Vitals reviewed.  HENT:  Head: Normocephalic and atraumatic.  Eyes:     Pupils: Pupils are equal, round, and reactive to light.  Cardiovascular:     Rate and Rhythm: Normal rate and regular rhythm.     Heart sounds: Normal heart sounds.  Pulmonary:     Effort: Pulmonary effort is normal.     Breath sounds: Normal breath sounds.  Abdominal:     General: Bowel sounds are normal.     Palpations: Abdomen is soft.  Musculoskeletal:        General: No tenderness or deformity. Normal range of motion.     Cervical back: Normal range of motion.  Lymphadenopathy:     Cervical: No cervical adenopathy.  Skin:    General: Skin is warm and dry.     Findings: No erythema or rash.  Neurological:     Mental Status: He is alert and  oriented to person, place, and time.  Psychiatric:        Behavior: Behavior normal.        Thought Content: Thought content normal.        Judgment: Judgment normal.      Lab Results  Component Value Date   WBC 12.5 (H) 05/11/2020   HGB 12.7 (L) 05/11/2020   HCT 38.0 (L) 05/11/2020   MCV 101.9 (H) 05/11/2020   PLT 219 05/11/2020     Chemistry      Component Value Date/Time   NA 139 05/11/2020 1416   NA 139 02/03/2018 0843   K 4.7 05/11/2020 1416   CL 106 05/11/2020 1416   CO2 26 05/11/2020 1416   BUN 22 05/11/2020 1416   BUN 22 02/03/2018 0843   CREATININE 1.58 (H) 05/11/2020 1416      Component Value Date/Time   CALCIUM 9.3 05/11/2020 1416   ALKPHOS 222 (H) 05/11/2020 1416   AST 35 05/11/2020 1416   ALT 14 05/11/2020 1416   BILITOT 1.1 05/11/2020 1416       Impression and Plan: Bruce Gray is a very nice 79 year old white male.  He has metastatic carcinoid.  I have to believe that the Somatuline will help. He does not have a high-grade carcinoid by the pathology.  He will get his 3nd dose of Somatuline today.  I will see about getting an ultrasound of his abdomen tomorrow.  We will have him come back monthly.   Volanda Napoleon, MD 6/2/20212:59 PM

## 2020-05-12 ENCOUNTER — Telehealth: Payer: Self-pay | Admitting: Hematology & Oncology

## 2020-05-12 LAB — CHROMOGRANIN A: Chromogranin A (ng/mL): 2523 ng/mL — ABNORMAL HIGH (ref 0.0–101.8)

## 2020-05-12 NOTE — Telephone Encounter (Signed)
Appointments scheduled calendar printed per 6/2 los 

## 2020-05-18 ENCOUNTER — Other Ambulatory Visit: Payer: Self-pay | Admitting: Hematology & Oncology

## 2020-05-18 ENCOUNTER — Ambulatory Visit
Admission: RE | Admit: 2020-05-18 | Discharge: 2020-05-18 | Disposition: A | Discharge status: Home / Self Care | Payer: Medicare HMO | Source: Ambulatory Visit | Attending: Hematology & Oncology | Admitting: Hematology & Oncology

## 2020-05-18 DIAGNOSIS — D3A098 Benign carcinoid tumors of other sites: Secondary | ICD-10-CM

## 2020-05-18 DIAGNOSIS — R188 Other ascites: Secondary | ICD-10-CM | POA: Diagnosis not present

## 2020-05-18 NOTE — Progress Notes (Signed)
I called Mr. Jury and his wife.  I got the report from the ultrasound of his abdomen.  He has a lot of ascites.  I have no idea why he has the ascites.  I do not think he has cirrhosis.  I would not think carcinoid would cause ascites.  He needs to have a paracentesis done.  I put the order in for the paracentesis to be done on Thursday.  We will need to make sure that the fluid is sent off for cytology, albumin, LDH, and microbiology/cultures.  I need as much fluid taken off as possible.  This will make him feel a lot better.  Again I am not certain as to why all of this fluid is developing.  His chromogranin A level is also going up.  This is certainly a much more aggressive process and I would have thought if this is all from the carcinoid.  If it is, we will definitely need to change therapy and probably have to use chemotherapy.  Lattie Haw, MD

## 2020-05-20 ENCOUNTER — Ambulatory Visit (HOSPITAL_COMMUNITY)
Admission: RE | Admit: 2020-05-20 | Discharge: 2020-05-20 | Disposition: A | Discharge status: Home / Self Care | Payer: Medicare HMO | Source: Ambulatory Visit | Attending: Hematology & Oncology | Admitting: Hematology & Oncology

## 2020-05-20 DIAGNOSIS — D3A098 Benign carcinoid tumors of other sites: Secondary | ICD-10-CM | POA: Insufficient documentation

## 2020-05-20 DIAGNOSIS — R188 Other ascites: Secondary | ICD-10-CM | POA: Diagnosis not present

## 2020-05-20 LAB — ALBUMIN, PLEURAL OR PERITONEAL FLUID: Albumin, Fluid: 1.2 g/dL

## 2020-05-20 LAB — LACTATE DEHYDROGENASE, PLEURAL OR PERITONEAL FLUID: LD, Fluid: 72 U/L — ABNORMAL HIGH (ref 3–23)

## 2020-05-20 MED ORDER — LIDOCAINE HCL 1 % IJ SOLN
INTRAMUSCULAR | Status: AC
  Filled 2020-05-20: qty 20

## 2020-05-20 NOTE — Procedures (Signed)
Ultrasound-guided diagnostic and therapeutic paracentesis performed yielding 3320 liters of straw colored fluid.  Fluid was sent to lab for analysis. No immediate complications. EBL is < 2 ml.

## 2020-05-23 ENCOUNTER — Encounter: Payer: Self-pay | Admitting: *Deleted

## 2020-05-23 LAB — BODY FLUID CULTURE: Culture: NO GROWTH

## 2020-05-23 LAB — CYTOLOGY - NON PAP

## 2020-05-29 ENCOUNTER — Encounter: Payer: Self-pay | Admitting: *Deleted

## 2020-05-30 ENCOUNTER — Encounter: Payer: Self-pay | Admitting: Hematology & Oncology

## 2020-05-30 ENCOUNTER — Telehealth: Payer: Self-pay | Admitting: *Deleted

## 2020-05-30 ENCOUNTER — Other Ambulatory Visit: Payer: Self-pay

## 2020-05-30 ENCOUNTER — Encounter: Payer: Self-pay | Admitting: *Deleted

## 2020-05-30 ENCOUNTER — Inpatient Hospital Stay: Payer: Medicare HMO

## 2020-05-30 ENCOUNTER — Ambulatory Visit (HOSPITAL_COMMUNITY)
Admission: RE | Admit: 2020-05-30 | Discharge: 2020-05-30 | Disposition: A | Discharge status: Home / Self Care | Payer: Medicare HMO | Source: Ambulatory Visit | Attending: Family | Admitting: Family

## 2020-05-30 ENCOUNTER — Other Ambulatory Visit: Payer: Self-pay | Admitting: Family

## 2020-05-30 ENCOUNTER — Inpatient Hospital Stay (HOSPITAL_BASED_OUTPATIENT_CLINIC_OR_DEPARTMENT_OTHER): Payer: Medicare HMO | Admitting: Hematology & Oncology

## 2020-05-30 VITALS — BP 150/62 | HR 66 | Temp 96.9°F | Resp 18 | Wt 144.0 lb

## 2020-05-30 DIAGNOSIS — C259 Malignant neoplasm of pancreas, unspecified: Secondary | ICD-10-CM | POA: Insufficient documentation

## 2020-05-30 DIAGNOSIS — C799 Secondary malignant neoplasm of unspecified site: Secondary | ICD-10-CM | POA: Diagnosis not present

## 2020-05-30 DIAGNOSIS — C7A098 Malignant carcinoid tumors of other sites: Secondary | ICD-10-CM | POA: Diagnosis not present

## 2020-05-30 DIAGNOSIS — R188 Other ascites: Secondary | ICD-10-CM | POA: Insufficient documentation

## 2020-05-30 DIAGNOSIS — D3A098 Benign carcinoid tumors of other sites: Secondary | ICD-10-CM

## 2020-05-30 DIAGNOSIS — E1100 Type 2 diabetes mellitus with hyperosmolarity without nonketotic hyperglycemic-hyperosmolar coma (NKHHC): Secondary | ICD-10-CM

## 2020-05-30 DIAGNOSIS — R978 Other abnormal tumor markers: Secondary | ICD-10-CM | POA: Diagnosis not present

## 2020-05-30 DIAGNOSIS — C7B02 Secondary carcinoid tumors of liver: Secondary | ICD-10-CM | POA: Diagnosis not present

## 2020-05-30 DIAGNOSIS — R140 Abdominal distension (gaseous): Secondary | ICD-10-CM | POA: Diagnosis not present

## 2020-05-30 LAB — CBC WITH DIFFERENTIAL (CANCER CENTER ONLY)
Abs Immature Granulocytes: 0.07 10*3/uL (ref 0.00–0.07)
Basophils Absolute: 0.1 10*3/uL (ref 0.0–0.1)
Basophils Relative: 1 %
Eosinophils Absolute: 0.5 10*3/uL (ref 0.0–0.5)
Eosinophils Relative: 4 %
HCT: 42.1 % (ref 39.0–52.0)
Hemoglobin: 13.9 g/dL (ref 13.0–17.0)
Immature Granulocytes: 1 %
Lymphocytes Relative: 15 %
Lymphs Abs: 1.8 10*3/uL (ref 0.7–4.0)
MCH: 33.9 pg (ref 26.0–34.0)
MCHC: 33 g/dL (ref 30.0–36.0)
MCV: 102.7 fL — ABNORMAL HIGH (ref 80.0–100.0)
Monocytes Absolute: 1.2 10*3/uL — ABNORMAL HIGH (ref 0.1–1.0)
Monocytes Relative: 10 %
Neutro Abs: 8.4 10*3/uL — ABNORMAL HIGH (ref 1.7–7.7)
Neutrophils Relative %: 69 %
Platelet Count: 302 10*3/uL (ref 150–400)
RBC: 4.1 MIL/uL — ABNORMAL LOW (ref 4.22–5.81)
RDW: 14.8 % (ref 11.5–15.5)
WBC Count: 12.2 10*3/uL — ABNORMAL HIGH (ref 4.0–10.5)
nRBC: 0 % (ref 0.0–0.2)

## 2020-05-30 LAB — CMP (CANCER CENTER ONLY)
ALT: 11 U/L (ref 0–44)
AST: 29 U/L (ref 15–41)
Albumin: 3.4 g/dL — ABNORMAL LOW (ref 3.5–5.0)
Alkaline Phosphatase: 191 U/L — ABNORMAL HIGH (ref 38–126)
Anion gap: 8 (ref 5–15)
BUN: 29 mg/dL — ABNORMAL HIGH (ref 8–23)
CO2: 26 mmol/L (ref 22–32)
Calcium: 9.2 mg/dL (ref 8.9–10.3)
Chloride: 105 mmol/L (ref 98–111)
Creatinine: 1.57 mg/dL — ABNORMAL HIGH (ref 0.61–1.24)
GFR, Est AFR Am: 47 mL/min — ABNORMAL LOW (ref >60)
GFR, Estimated: 41 mL/min — ABNORMAL LOW (ref >60)
Glucose, Bld: 121 mg/dL — ABNORMAL HIGH (ref 70–99)
Potassium: 4.9 mmol/L (ref 3.5–5.1)
Sodium: 139 mmol/L (ref 135–145)
Total Bilirubin: 1 mg/dL (ref 0.3–1.2)
Total Protein: 6.5 g/dL (ref 6.5–8.1)

## 2020-05-30 LAB — PROTIME-INR
INR: 1.1 (ref 0.8–1.2)
Prothrombin Time: 14 seconds (ref 11.4–15.2)

## 2020-05-30 MED ORDER — LIDOCAINE HCL 1 % IJ SOLN
INTRAMUSCULAR | Status: AC
  Filled 2020-05-30: qty 20

## 2020-05-30 NOTE — Progress Notes (Signed)
Oncology Nurse Navigator Documentation  Oncology Nurse Navigator Flowsheets 05/30/2020  Abnormal Finding Date -  Confirmed Diagnosis Date -  Diagnosis Status -  Planned Course of Treatment -  Phase of Treatment -  Hormonal Therapy Pending- Reason: -  Hormonal Actual Start Date: -  Navigator Follow Up Date: -  Navigator Follow Up Reason: -  Navigator Location [facility]  Referral Date to RadOnc/MedOnc -  Navigator Encounter Type MyChart;Telephone  Telephone Outgoing Call;Symptom Mgt  Treatment Initiated Date -  Patient Visit Type MedOnc  Treatment Phase Active Tx  Barriers/Navigation Needs Family Concerns;Pain  Education -  Interventions Coordination of Care;Psycho-Social Support  Acuity Level 2-Minimal Needs (1-2 Barriers Identified)  Referrals -  Coordination of Care Appts  Education Method -  Support Groups/Services Friends and Family  Time Spent with Patient 30

## 2020-05-30 NOTE — Telephone Encounter (Signed)
Called wife and gave her the appointment time to see Dr. Penelope Coop. Appointment is Wednesday, June 30th at 10:45 am. Please arrive at 10:30 with your mask. Wife verbalized understanding.

## 2020-05-30 NOTE — Procedures (Signed)
PROCEDURE SUMMARY:  Successful US guided paracentesis from right lateral abdomen.  Yielded 4 L of clear yellow fluid.  No immediate complications.  Pt tolerated well.   Specimen sent for labs.  EBL < 54mL  Jayln Branscom R Leory Allinson, NP 05/30/2020 2:46 PM

## 2020-05-30 NOTE — Progress Notes (Signed)
Spoke with patient and wife while here for symptom management appointment. Dr Marin Olp would like patient to have a paracentesis today - which has already been scheduled - and an MRI of the liver.   Will follow to ensure that MRI is scheduled.  Oncology Nurse Navigator Documentation  Oncology Nurse Navigator Flowsheets 05/30/2020  Abnormal Finding Date -  Confirmed Diagnosis Date -  Diagnosis Status -  Planned Course of Treatment -  Phase of Treatment -  Hormonal Therapy Pending- Reason: -  Hormonal Actual Start Date: -  Navigator Follow Up Date: 06/09/2020  Navigator Follow Up Reason: Follow-up Appointment;Chemotherapy  Navigator Location [facility]  Referral Date to RadOnc/MedOnc -  Navigator Encounter Type Follow-up Appt  Telephone -  Treatment Initiated Date -  Patient Visit Type MedOnc  Treatment Phase Active Tx  Barriers/Navigation Needs -  Education -  Interventions Psycho-Social Support  Acuity Level 2-Minimal Needs (1-2 Barriers Identified)  Referrals -  Coordination of Care -  Education Method -  Support Groups/Services Friends and Family  Time Spent with Patient 30

## 2020-05-30 NOTE — Progress Notes (Signed)
Hematology and Oncology Follow Up Visit  Bruce Gray [id number] 1941-08-06 79 y.o. 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  The problem that we now have with him is that he has his recurrent ascites.  The ascites is not malignant.  I cannot believe that the ascites is in any way related to him having carcinoid.  I have never seen a patient with carcinoid develop ascites.  He had I think 6 L of fluid taken out on June 9.  The pathology report ([id number]) showed reactive cells.  There is no evidence of malignancy.  Again, it says if he has portal hypertension.  He does not have cirrhosis.  When I looked at his MRI of the liver 4 months ago.  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  Maybe this is the reason why he has this ascites.  I want to repeat another MRI to see how everything looks and see if there is anything different.  We have to refer him to his gastroenterologist to see if he can help out with this ascites.  The ascites is back.  Recurrent have to do another paracentesis on him.  Thankfully, [facility] will be able to do this today.  He is not able to eat that much because of the ascites.  He has had no diarrhea.  He has had no cough or shortness of breath.  There is been no leg swelling.  I must say that this is a very nagging problem.  Again I just cannot believe this is from him having carcinoid.  Currently, I would say his performance status is ECOG 2.    Medications:  Current Outpatient Medications:  .  acetaminophen (TYLENOL) 500 MG tablet, Take 500 mg by mouth daily as needed for moderate pain or headache., Disp: , Rfl:  .  allopurinol (ZYLOPRIM) 100 MG tablet, Take 100 mg by mouth 2 (two) times daily. , Disp: , Rfl:  .   atorvastatin (LIPITOR) 80 MG tablet, TAKE 1 TABLET EVERY DAY  AT  6PM.  KEEP UPCOMING APPOINTMENT, Disp: 90 tablet, Rfl: 2 .  carvedilol (COREG) 6.25 MG tablet, Take 1 tablet (6.25 mg total) by mouth 2 (two) times daily with a meal., Disp: 180 tablet, Rfl: 3 .  docusate sodium (COLACE) 100 MG capsule, Take 100 mg by mouth daily as needed for mild constipation. , Disp: , Rfl:  .  isosorbide mononitrate (IMDUR) 30 MG 24 hr tablet, Take 0.5 tablets (15 mg total) by mouth daily., Disp: 45 tablet, Rfl: 1 .  metFORMIN (GLUCOPHAGE) 500 MG tablet, Take 500 mg by mouth every evening., Disp: , Rfl:  .  omeprazole (PRILOSEC) 20 MG capsule, Take 1 capsule (20 mg total) by mouth in the morning and at bedtime., Disp: 180 capsule, Rfl: 3  Allergies:  Allergies  Allergen Reactions  . Codeine Nausea And Vomiting  . Lansoprazole Diarrhea and Nausea And Vomiting    Past Medical History, Surgical history, Social history, and Family History were reviewed and updated.  Review of Systems: Review of Systems  Constitutional: Negative.   HENT:  Negative.   Eyes: Negative.   Respiratory: Negative.   Cardiovascular: Negative.   Gastrointestinal: Positive for diarrhea.  Endocrine: Negative.   Genitourinary:  Negative.    Musculoskeletal: Negative.   Skin: Negative.   Neurological: Negative.   Hematological: Negative.   Psychiatric/Behavioral: Negative.     Physical Exam:  weight is 144 lb (65.3 kg). His temporal temperature is 96.9 F (36.1 C) (abnormal). His blood pressure is 150/62 (abnormal) and his pulse is 66. His respiration is 18 and oxygen saturation is 99%.   Wt Readings from Last 3 Encounters:  05/30/20 144 lb (65.3 kg)  05/11/20 146 lb 6.4 oz (66.4 kg)  04/13/20 144 lb 6.4 oz (65.5 kg)    Physical Exam Vitals reviewed.  HENT:     Head: Normocephalic and atraumatic.  Eyes:     Pupils: Pupils are equal, round, and reactive to light.  Cardiovascular:     Rate and Rhythm: Normal rate and  regular rhythm.     Heart sounds: Normal heart sounds.  Pulmonary:     Effort: Pulmonary effort is normal.     Breath sounds: Normal breath sounds.  Abdominal:     General: Bowel sounds are normal.     Palpations: Abdomen is soft.     Comments: His abdomen is somewhat distended.  There is a fluid wave.  There is no guarding or rebound tenderness.  There is no obvious hepatosplenomegaly.  Musculoskeletal:        General: No tenderness or deformity. Normal range of motion.     Cervical back: Normal range of motion.  Lymphadenopathy:     Cervical: No cervical adenopathy.  Skin:    General: Skin is warm and dry.     Findings: No erythema or rash.  Neurological:     Mental Status: He is alert and oriented to person, place, and time.  Psychiatric:        Behavior: Behavior normal.        Thought Content: Thought content normal.        Judgment: Judgment normal.    Lab Results  Component Value Date   WBC 12.2 (H) 05/30/2020   HGB 13.9 05/30/2020   HCT 42.1 05/30/2020   MCV 102.7 (H) 05/30/2020   PLT 302 05/30/2020     Chemistry      Component Value Date/Time   NA 139 05/30/2020 1125   NA 139 02/03/2018 0843   K 4.9 05/30/2020 1125   CL 105 05/30/2020 1125   CO2 26 05/30/2020 1125   BUN 29 (H) 05/30/2020 1125   BUN 22 02/03/2018 0843   CREATININE 1.57 (H) 05/30/2020 1125      Component Value Date/Time   CALCIUM 9.2 05/30/2020 1125   ALKPHOS 191 (H) 05/30/2020 1125   AST 29 05/30/2020 1125   ALT 11 05/30/2020 1125   BILITOT 1.0 05/30/2020 1125       Impression and Plan: Bruce Gray is a very nice 79 year old white male.  He has metastatic carcinoid.  Right now, the problem is this ascites.  I just am not certain why this ascites keeps coming back.  Hopefully the MRI of the liver might help [place] out.  If he does have this thrombotic issue, we may have to get him on anticoagulation to see if this might help.  We will get him to Dr. Penelope Coop of gastroenterology who has seen  Bruce Gray in the past.  Again we will get the paracentesis today.  This is very complicated.  I spent about 45 minutes with he and his wife.  This is very puzzling.  He did not have ascites when I first  saw him.  We have been treating the carcinoid.  He has no evidence of carcinoid syndrome.  He will keep his appointment to see [place] next week.   Volanda Napoleon, MD 6/21/20211:11 PM

## 2020-05-31 LAB — CHROMOGRANIN A: Chromogranin A (ng/mL): 2116 ng/mL — ABNORMAL HIGH (ref 0.0–101.8)

## 2020-05-31 LAB — CANCER ANTIGEN 19-9: CA 19-9: 89 U/mL — ABNORMAL HIGH (ref 0–35)

## 2020-05-31 LAB — CYTOLOGY - NON PAP

## 2020-06-06 DIAGNOSIS — E785 Hyperlipidemia, unspecified: Secondary | ICD-10-CM | POA: Diagnosis not present

## 2020-06-06 DIAGNOSIS — I10 Essential (primary) hypertension: Secondary | ICD-10-CM | POA: Diagnosis not present

## 2020-06-06 DIAGNOSIS — N189 Chronic kidney disease, unspecified: Secondary | ICD-10-CM | POA: Diagnosis not present

## 2020-06-06 DIAGNOSIS — I2510 Atherosclerotic heart disease of native coronary artery without angina pectoris: Secondary | ICD-10-CM | POA: Diagnosis not present

## 2020-06-06 DIAGNOSIS — H269 Unspecified cataract: Secondary | ICD-10-CM | POA: Diagnosis not present

## 2020-06-06 DIAGNOSIS — E1169 Type 2 diabetes mellitus with other specified complication: Secondary | ICD-10-CM | POA: Diagnosis not present

## 2020-06-06 DIAGNOSIS — E119 Type 2 diabetes mellitus without complications: Secondary | ICD-10-CM | POA: Diagnosis not present

## 2020-06-06 DIAGNOSIS — Z8546 Personal history of malignant neoplasm of prostate: Secondary | ICD-10-CM | POA: Diagnosis not present

## 2020-06-06 DIAGNOSIS — E7800 Pure hypercholesterolemia, unspecified: Secondary | ICD-10-CM | POA: Diagnosis not present

## 2020-06-08 ENCOUNTER — Other Ambulatory Visit: Payer: Self-pay | Admitting: Cardiovascular Disease

## 2020-06-08 DIAGNOSIS — C7B02 Secondary carcinoid tumors of liver: Secondary | ICD-10-CM | POA: Diagnosis not present

## 2020-06-08 DIAGNOSIS — K766 Portal hypertension: Secondary | ICD-10-CM | POA: Diagnosis not present

## 2020-06-08 DIAGNOSIS — R188 Other ascites: Secondary | ICD-10-CM | POA: Diagnosis not present

## 2020-06-09 ENCOUNTER — Inpatient Hospital Stay (HOSPITAL_BASED_OUTPATIENT_CLINIC_OR_DEPARTMENT_OTHER): Payer: Medicare HMO | Admitting: Hematology & Oncology

## 2020-06-09 ENCOUNTER — Encounter: Payer: Self-pay | Admitting: *Deleted

## 2020-06-09 ENCOUNTER — Inpatient Hospital Stay: Payer: Medicare HMO

## 2020-06-09 ENCOUNTER — Telehealth: Payer: Self-pay | Admitting: Cardiology

## 2020-06-09 ENCOUNTER — Ambulatory Visit (HOSPITAL_COMMUNITY)
Admission: RE | Admit: 2020-06-09 | Discharge: 2020-06-09 | Disposition: A | Discharge status: Home / Self Care | Payer: Medicare HMO | Source: Ambulatory Visit | Attending: Family | Admitting: Family

## 2020-06-09 ENCOUNTER — Other Ambulatory Visit: Payer: Self-pay

## 2020-06-09 ENCOUNTER — Encounter: Payer: Self-pay | Admitting: Hematology & Oncology

## 2020-06-09 ENCOUNTER — Inpatient Hospital Stay: Payer: Medicare HMO | Attending: Hematology & Oncology

## 2020-06-09 ENCOUNTER — Other Ambulatory Visit: Payer: Self-pay | Admitting: Family

## 2020-06-09 VITALS — BP 103/52 | HR 74 | Temp 96.8°F | Resp 18 | Wt 130.0 lb

## 2020-06-09 DIAGNOSIS — D3A098 Benign carcinoid tumors of other sites: Secondary | ICD-10-CM | POA: Insufficient documentation

## 2020-06-09 DIAGNOSIS — C799 Secondary malignant neoplasm of unspecified site: Secondary | ICD-10-CM | POA: Diagnosis not present

## 2020-06-09 DIAGNOSIS — K766 Portal hypertension: Secondary | ICD-10-CM | POA: Diagnosis not present

## 2020-06-09 DIAGNOSIS — R188 Other ascites: Secondary | ICD-10-CM | POA: Diagnosis not present

## 2020-06-09 DIAGNOSIS — C259 Malignant neoplasm of pancreas, unspecified: Secondary | ICD-10-CM | POA: Insufficient documentation

## 2020-06-09 DIAGNOSIS — C7A098 Malignant carcinoid tumors of other sites: Secondary | ICD-10-CM | POA: Diagnosis not present

## 2020-06-09 DIAGNOSIS — C7B02 Secondary carcinoid tumors of liver: Secondary | ICD-10-CM | POA: Diagnosis not present

## 2020-06-09 LAB — CMP (CANCER CENTER ONLY)
ALT: 14 U/L (ref 0–44)
AST: 31 U/L (ref 15–41)
Albumin: 2.8 g/dL — ABNORMAL LOW (ref 3.5–5.0)
Alkaline Phosphatase: 162 U/L — ABNORMAL HIGH (ref 38–126)
Anion gap: 9 (ref 5–15)
BUN: 30 mg/dL — ABNORMAL HIGH (ref 8–23)
CO2: 24 mmol/L (ref 22–32)
Calcium: 8.4 mg/dL — ABNORMAL LOW (ref 8.9–10.3)
Chloride: 104 mmol/L (ref 98–111)
Creatinine: 2.03 mg/dL — ABNORMAL HIGH (ref 0.61–1.24)
GFR, Est AFR Am: 35 mL/min — ABNORMAL LOW (ref >60)
GFR, Estimated: 30 mL/min — ABNORMAL LOW (ref >60)
Glucose, Bld: 139 mg/dL — ABNORMAL HIGH (ref 70–99)
Potassium: 4.5 mmol/L (ref 3.5–5.1)
Sodium: 137 mmol/L (ref 135–145)
Total Bilirubin: 1 mg/dL (ref 0.3–1.2)
Total Protein: 6.2 g/dL — ABNORMAL LOW (ref 6.5–8.1)

## 2020-06-09 LAB — CBC WITH DIFFERENTIAL (CANCER CENTER ONLY)
Abs Immature Granulocytes: 0.07 10*3/uL (ref 0.00–0.07)
Basophils Absolute: 0.1 10*3/uL (ref 0.0–0.1)
Basophils Relative: 1 %
Eosinophils Absolute: 0.3 10*3/uL (ref 0.0–0.5)
Eosinophils Relative: 3 %
HCT: 39.8 % (ref 39.0–52.0)
Hemoglobin: 13.1 g/dL (ref 13.0–17.0)
Immature Granulocytes: 1 %
Lymphocytes Relative: 17 %
Lymphs Abs: 2 10*3/uL (ref 0.7–4.0)
MCH: 33.3 pg (ref 26.0–34.0)
MCHC: 32.9 g/dL (ref 30.0–36.0)
MCV: 101.3 fL — ABNORMAL HIGH (ref 80.0–100.0)
Monocytes Absolute: 1.3 10*3/uL — ABNORMAL HIGH (ref 0.1–1.0)
Monocytes Relative: 11 %
Neutro Abs: 7.9 10*3/uL — ABNORMAL HIGH (ref 1.7–7.7)
Neutrophils Relative %: 67 %
Platelet Count: 280 10*3/uL (ref 150–400)
RBC: 3.93 MIL/uL — ABNORMAL LOW (ref 4.22–5.81)
RDW: 14.7 % (ref 11.5–15.5)
WBC Count: 11.7 10*3/uL — ABNORMAL HIGH (ref 4.0–10.5)
nRBC: 0 % (ref 0.0–0.2)

## 2020-06-09 MED ORDER — LIDOCAINE HCL 1 % IJ SOLN
INTRAMUSCULAR | Status: AC
  Filled 2020-06-09: qty 20

## 2020-06-09 MED ORDER — LANREOTIDE ACETATE 120 MG/0.5ML ~~LOC~~ SOLN
120.0000 mg | Freq: Once | SUBCUTANEOUS | Status: AC
  Administered 2020-06-09: 120 mg via SUBCUTANEOUS

## 2020-06-09 MED ORDER — LANREOTIDE ACETATE 120 MG/0.5ML ~~LOC~~ SOLN
SUBCUTANEOUS | Status: AC
  Filled 2020-06-09: qty 120

## 2020-06-09 NOTE — Progress Notes (Signed)
Hematology and Oncology Follow Up Visit  KENDEL BESSEY [id number] 04-05-1941 79 y.o. 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 Aker is back for follow-up.  Unfortunately, he keeps having the recurrent ascites.  He had 5 L of fluid removed today.  He saw the wonderful and incredibly brilliant Dr. Penelope Coop of GI.  Dr. Penelope Coop thought that this ascites was from portal hypertension.  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  Is hard to say if this was the etiology for this recurrent ascites.  We have had the ascites checked a couple times.  There is no malignancy in the ascites.  Dr. Penelope Coop thought that diuretics would be reasonable.  He started him on Lasix 40 mg a day and Aldactone 50 mg a day.  I had to call Mr. Deer' cardiologist.  Mr. Holland is on Coreg and Imdur.  His cardiologist thought we can stop the indoor and if there is still pressure issues, decrease the dose of Coreg by half.  I still do not believe that the carcinoid is related to this ascites.  Again the ascites is not malignant.  His chromogranin A level has been coming down.  On her last checked it was 2100.  I just hate that we have to have Mr. Gootee deal with this recurrent ascites.  Again his rate seems as if portal hypertension is a major issue.  He really cannot eat all that much when he gets the ascites.  When the ascites is drained he feels a lot better.  He has not had any diarrhea.  He has had no cough or shortness of breath.  There is no wheezing.  There is no bleeding.  He has a little bit of leg swelling.  Currently, his performance status is ECOG 2.   Medications:  Current Outpatient Medications:  .  acetaminophen (TYLENOL) 500 MG tablet, Take 500 mg by mouth daily as needed for moderate  pain or headache., Disp: , Rfl:  .  allopurinol (ZYLOPRIM) 100 MG tablet, Take 100 mg by mouth 2 (two) times daily. , Disp: , Rfl:  .  atorvastatin (LIPITOR) 80 MG tablet, TAKE 1 TABLET EVERY DAY  AT  6PM.  KEEP UPCOMING APPOINTMENT, Disp: 90 tablet, Rfl: 2 .  carvedilol (COREG) 6.25 MG tablet, Take 1 tablet (6.25 mg total) by mouth 2 (two) times daily with a meal., Disp: 180 tablet, Rfl: 3 .  docusate sodium (COLACE) 100 MG capsule, Take 100 mg by mouth daily as needed for mild constipation. , Disp: , Rfl:  .  isosorbide mononitrate (IMDUR) 30 MG 24 hr tablet, TAKE 1/2 TABLET EVERY DAY, Disp: 45 tablet, Rfl: 2 .  metFORMIN (GLUCOPHAGE) 500 MG tablet, Take 500 mg by mouth every evening., Disp: , Rfl:  .  omeprazole (PRILOSEC) 20 MG capsule, Take 1 capsule (20 mg total) by mouth in the morning and at bedtime., Disp: 180 capsule, Rfl: 3 No current facility-administered medications for this visit.  Facility-Administered Medications Ordered in Other Visits:  .  lanreotide acetate (SOMATULINE DEPOT) injection 120 mg, 120 mg, Subcutaneous, Once, Amontae Ng R, MD .  lidocaine (XYLOCAINE) 1 % (with pres) injection, , , ,   Allergies:  Allergies  Allergen Reactions  . Codeine Nausea And Vomiting  .  Lansoprazole Diarrhea and Nausea And Vomiting    Past Medical History, Surgical history, Social history, and Family History were reviewed and updated.  Review of Systems: Review of Systems  Constitutional: Negative.   HENT:  Negative.   Eyes: Negative.   Respiratory: Negative.   Cardiovascular: Negative.   Gastrointestinal: Positive for diarrhea.  Endocrine: Negative.   Genitourinary: Negative.    Musculoskeletal: Negative.   Skin: Negative.   Neurological: Negative.   Hematological: Negative.   Psychiatric/Behavioral: Negative.     Physical Exam:  weight is 130 lb (59 kg). His temporal temperature is 96.8 F (36 C) (abnormal). His blood pressure is 103/52 (abnormal) and his pulse is  74. His respiration is 18 and oxygen saturation is 99%.   Wt Readings from Last 3 Encounters:  06/09/20 130 lb (59 kg)  05/30/20 144 lb (65.3 kg)  05/11/20 146 lb 6.4 oz (66.4 kg)    Physical Exam Vitals reviewed.  HENT:     Head: Normocephalic and atraumatic.  Eyes:     Pupils: Pupils are equal, round, and reactive to light.  Cardiovascular:     Rate and Rhythm: Normal rate and regular rhythm.     Heart sounds: Normal heart sounds.  Pulmonary:     Effort: Pulmonary effort is normal.     Breath sounds: Normal breath sounds.  Abdominal:     General: Bowel sounds are normal.     Palpations: Abdomen is soft.     Comments: His abdomen is somewhat distended.  There is a fluid wave.  There is no guarding or rebound tenderness.  There is no obvious hepatosplenomegaly.  Musculoskeletal:        General: No tenderness or deformity. Normal range of motion.     Cervical back: Normal range of motion.  Lymphadenopathy:     Cervical: No cervical adenopathy.  Skin:    General: Skin is warm and dry.     Findings: No erythema or rash.  Neurological:     Mental Status: He is alert and oriented to person, place, and time.  Psychiatric:        Behavior: Behavior normal.        Thought Content: Thought content normal.        Judgment: Judgment normal.    Lab Results  Component Value Date   WBC 11.7 (H) 06/09/2020   HGB 13.1 06/09/2020   HCT 39.8 06/09/2020   MCV 101.3 (H) 06/09/2020   PLT 280 06/09/2020     Chemistry      Component Value Date/Time   NA 137 06/09/2020 1458   NA 139 02/03/2018 0843   K 4.5 06/09/2020 1458   CL 104 06/09/2020 1458   CO2 24 06/09/2020 1458   BUN 30 (H) 06/09/2020 1458   BUN 22 02/03/2018 0843   CREATININE 2.03 (H) 06/09/2020 1458      Component Value Date/Time   CALCIUM 8.4 (L) 06/09/2020 1458   ALKPHOS 162 (H) 06/09/2020 1458   AST 31 06/09/2020 1458   ALT 14 06/09/2020 1458   BILITOT 1.0 06/09/2020 1458       Impression and Plan: Mr.  Citro is a very nice 79 year old white male.  He has metastatic carcinoid.  Right now, the problem is this ascites.   We are still awaiting his MRI.  This will also be done on July 7.  Dr. Penelope Coop feels that Mr. Dettmann may need a TIPS procedure if the ascites recurs.  I concur with this.  I  know this is quite complicated.  I just never expected that we would have problems with ascites as when I first saw him he never had ascites.  This seemed to develop all of a sudden.  He will get his Somatuline today.  Hopefully, we will see that his chromogranin A level is still going down.  We will have him come back in 1 month, or sooner depending on what happens with the ascites.  His wife will monitor his blood pressure very closely.  We spent about 45 minutes with him today.  Again this is quite complicated.    Volanda Napoleon, MD 7/1/20213:57 PM

## 2020-06-09 NOTE — Procedures (Signed)
PROCEDURE SUMMARY:  Successful image-guided paracentesis from the left lateral abdomen.  Yielded 5 liters of hazy yellow fluid.  No immediate complications.  EBL = 0 mL. Patient tolerated well.   Specimen was sent for labs.  Please see imaging section of Epic for full dictation.   Claris Pong Aamira Bischoff PA-C 06/09/2020 12:16 PM

## 2020-06-09 NOTE — Progress Notes (Signed)
Visited with wife and patient for some time before his appointment with Dr Marin Olp. Patient was seen by GI yesterday regarding his continued ascites but didn't feel like they understood that MDs plan of care. Also required another paracentesis today resulting in 5L of fluid being drawn off. He states he feels so much better after the procedure.   Reviewed concerns with Dr Marin Olp and provided him with Dr Estell Harpin progress note so that he could help patient and his wife understand his impression and treatment plan.

## 2020-06-09 NOTE — Telephone Encounter (Signed)
Dr. Marin Olp called wanting to speak to the DOD. Dr. Ellyn Hack.

## 2020-06-09 NOTE — Patient Instructions (Signed)
Lanreotide injection What is this medicine? LANREOTIDE (lan REE oh tide) is used to reduce blood levels of growth hormone in patients with a condition called acromegaly. It also works to slow or stop tumor growth in patients with neuroendocrine tumors and treat carcinoid syndrome. This medicine may be used for other purposes; ask your health care provider or pharmacist if you have questions. COMMON BRAND NAME(S): Somatuline [place] What should I tell my health care provider before I take this medicine? They need to know if you have any of these conditions:  diabetes  gallbladder disease  heart disease  kidney disease  liver disease  thyroid disease  an unusual or allergic reaction to lanreotide, other medicines, foods, dyes, or preservatives  pregnant or trying to get pregnant  breast-feeding How should I use this medicine? This medicine is for injection under the skin. It is given by a health care professional in a hospital or clinic setting. Contact your pediatrician or health care professional regarding the use of this medicine in children. Special care may be needed. Overdosage: If you think you have taken too much of this medicine contact a poison control center or emergency room at once. NOTE: This medicine is only for you. Do not share this medicine with others. What if I miss a dose? It is important not to miss your dose. Call your doctor or health care professional if you are unable to keep an appointment. What may interact with this medicine? This medicine may interact with the following medications:  bromocriptine  cyclosporine  certain medicines for blood pressure, heart disease, irregular heart beat  certain medicines for diabetes  quinidine  terfenadine This list may not describe all possible interactions. Give your health care provider a list of all the medicines, herbs, non-prescription drugs, or dietary supplements you use. Also tell them if you smoke,  drink alcohol, or use illegal drugs. Some items may interact with your medicine. What should I watch for while using this medicine? Tell your doctor or healthcare professional if your symptoms do not start to get better or if they get worse. Visit your doctor or health care professional for regular checks on your progress. Your condition will be monitored carefully while you are receiving this medicine. This medicine may increase blood sugar. Ask your healthcare provider if changes in diet or medicines are needed if you have diabetes. You may need blood work done while you are taking this medicine. Women should inform their doctor if they wish to become pregnant or think they might be pregnant. There is a potential for serious side effects to an unborn child. Talk to your health care professional or pharmacist for more information. Do not breast-feed an infant while taking this medicine or for 6 months after stopping it. This medicine has caused ovarian failure in some women. This medicine may interfere with the ability to have a child. Talk with your doctor or health care professional if you are concerned about your fertility. What side effects may I notice from receiving this medicine? Side effects that you should report to your doctor or health care professional as soon as possible:  allergic reactions like skin rash, itching or hives, swelling of the face, lips, or tongue  increased blood pressure  severe stomach pain  signs and symptoms of hgh blood sugar such as being more thirsty or hungry or having to urinate more than normal. You may also feel very tired or have blurry vision.  signs and symptoms of low blood   sugar such as feeling anxious; confusion; dizziness; increased hunger; unusually weak or tired; sweating; shakiness; cold; irritable; headache; blurred vision; fast heartbeat; loss of consciousness  unusually slow heartbeat Side effects that usually do not require medical  attention (report to your doctor or health care professional if they continue or are bothersome):  constipation  diarrhea  dizziness  headache  muscle pain  muscle spasms  nausea  pain, redness, or irritation at site where injected This list may not describe all possible side effects. Call your doctor for medical advice about side effects. You may report side effects to FDA at 1-800-FDA-1088. Where should I keep my medicine? This drug is given in a hospital or clinic and will not be stored at home. NOTE: This sheet is a summary. It may not cover all possible information. If you have questions about this medicine, talk to your doctor, pharmacist, or health care provider.  2020 Elsevier/Gold Standard (2018-09-04 09:13:08)  

## 2020-06-10 LAB — CHROMOGRANIN A: Chromogranin A (ng/mL): 2153 ng/mL — ABNORMAL HIGH (ref 0.0–101.8)

## 2020-06-10 LAB — CYTOLOGY - NON PAP

## 2020-06-10 LAB — LACTATE DEHYDROGENASE: LDH: 152 U/L (ref 98–192)

## 2020-06-10 NOTE — Telephone Encounter (Signed)
Dr. Marin Olp called because patient was having orthostatic symptoms.  We discussed stopping Imdur and cutting down his antihypertensive agents.  Glenetta Hew, MD

## 2020-06-10 NOTE — Telephone Encounter (Signed)
Thanks, agree.

## 2020-06-15 ENCOUNTER — Other Ambulatory Visit: Payer: Self-pay

## 2020-06-15 ENCOUNTER — Ambulatory Visit (HOSPITAL_COMMUNITY)
Admission: RE | Admit: 2020-06-15 | Discharge: 2020-06-15 | Disposition: A | Discharge status: Home / Self Care | Payer: Medicare HMO | Source: Ambulatory Visit | Attending: Hematology & Oncology | Admitting: Hematology & Oncology

## 2020-06-15 DIAGNOSIS — R188 Other ascites: Secondary | ICD-10-CM | POA: Diagnosis not present

## 2020-06-15 DIAGNOSIS — K7689 Other specified diseases of liver: Secondary | ICD-10-CM | POA: Diagnosis not present

## 2020-06-15 DIAGNOSIS — C787 Secondary malignant neoplasm of liver and intrahepatic bile duct: Secondary | ICD-10-CM | POA: Diagnosis not present

## 2020-06-15 DIAGNOSIS — I81 Portal vein thrombosis: Secondary | ICD-10-CM | POA: Diagnosis not present

## 2020-06-15 DIAGNOSIS — C7A00 Malignant carcinoid tumor of unspecified site: Secondary | ICD-10-CM | POA: Diagnosis not present

## 2020-06-15 MED ORDER — GADOBUTROL 1 MMOL/ML IV SOLN
5.0000 mL | Freq: Once | INTRAVENOUS | Status: AC | PRN
  Administered 2020-06-15: 5 mL via INTRAVENOUS

## 2020-06-18 ENCOUNTER — Other Ambulatory Visit: Payer: Self-pay | Admitting: Hematology & Oncology

## 2020-06-18 DIAGNOSIS — D3A098 Benign carcinoid tumors of other sites: Secondary | ICD-10-CM

## 2020-06-20 ENCOUNTER — Telehealth: Payer: Self-pay

## 2020-06-20 ENCOUNTER — Encounter: Payer: Self-pay | Admitting: *Deleted

## 2020-06-20 NOTE — Telephone Encounter (Addendum)
Attached message given to pt's wife Bruce Gray who verbalizes understanding. Bruce Gray has concerns related to hypotension and ascites, but has f/u appt with Dr Penelope Coop tomorrow related to these. dph  ----- Message from Volanda Napoleon, MD sent at 06/18/2020 11:33 AM EDT ----- Please call - I tried calling with no answer.  The MRI shows NO growth in carcinoid.  Some tumors are a little smaller.  There MAY be a blood clot but it is NOT occlusive in portal vein.  We need to check an echo for cardiac function.  I will put the order in.  Laurey Arrow

## 2020-06-20 NOTE — Progress Notes (Signed)
Patient needs an ECHO after his MRI of the liver. Spoke with financial specialist and insurance authorization is required with them requiring clinical documentation. Will continue to follow to ensure that ECHO is approved and scheduled.

## 2020-06-21 DIAGNOSIS — R188 Other ascites: Secondary | ICD-10-CM | POA: Diagnosis not present

## 2020-06-21 DIAGNOSIS — C7B00 Secondary carcinoid tumors, unspecified site: Secondary | ICD-10-CM | POA: Diagnosis not present

## 2020-06-21 DIAGNOSIS — K766 Portal hypertension: Secondary | ICD-10-CM | POA: Diagnosis not present

## 2020-06-21 DIAGNOSIS — I81 Portal vein thrombosis: Secondary | ICD-10-CM | POA: Diagnosis not present

## 2020-06-27 ENCOUNTER — Encounter: Payer: Self-pay | Admitting: *Deleted

## 2020-06-27 NOTE — Progress Notes (Signed)
Patient Bruce Gray approved but still not scheduled. Called Elmyra Ricks in CV and scheduled patient for 06/29/2020 at 11:15 at [facility].   Called and spoke to patient's wife, Bruce Gray. Gave her appointment details including time, date and location.   She informs [place] that patient has been seen by GI and has been referred to a liver specialist in the [facility] system. They have an appointment tomorrow. He has also been started on anticoagulation, lasix and aldactone. She states patient's abdomen is distended and he's feeling uncomfortable, but that Dr Penelope Coop felt like the repeated paracentesis should be avoided. They will wait and see what the physician states tomorrow. She will call and let us know what was discussed.

## 2020-06-28 ENCOUNTER — Other Ambulatory Visit (HOSPITAL_COMMUNITY): Payer: Medicare HMO

## 2020-06-28 DIAGNOSIS — C7A094 Malignant carcinoid tumor of the foregut NOS: Secondary | ICD-10-CM | POA: Diagnosis not present

## 2020-06-28 DIAGNOSIS — I81 Portal vein thrombosis: Secondary | ICD-10-CM | POA: Diagnosis not present

## 2020-06-28 DIAGNOSIS — C7B02 Secondary carcinoid tumors of liver: Secondary | ICD-10-CM | POA: Diagnosis not present

## 2020-06-28 DIAGNOSIS — R188 Other ascites: Secondary | ICD-10-CM | POA: Diagnosis not present

## 2020-06-28 DIAGNOSIS — C7B00 Secondary carcinoid tumors, unspecified site: Secondary | ICD-10-CM | POA: Diagnosis not present

## 2020-06-29 ENCOUNTER — Other Ambulatory Visit (HOSPITAL_COMMUNITY): Payer: Self-pay | Admitting: Nurse Practitioner

## 2020-06-29 ENCOUNTER — Ambulatory Visit (HOSPITAL_COMMUNITY)
Admission: RE | Admit: 2020-06-29 | Discharge: 2020-06-29 | Disposition: A | Discharge status: Home / Self Care | Payer: Medicare HMO | Source: Ambulatory Visit | Attending: Hematology & Oncology | Admitting: Hematology & Oncology

## 2020-06-29 ENCOUNTER — Encounter: Payer: Self-pay | Admitting: *Deleted

## 2020-06-29 ENCOUNTER — Other Ambulatory Visit: Payer: Self-pay

## 2020-06-29 DIAGNOSIS — R188 Other ascites: Secondary | ICD-10-CM

## 2020-06-29 DIAGNOSIS — D3A098 Benign carcinoid tumors of other sites: Secondary | ICD-10-CM | POA: Insufficient documentation

## 2020-06-29 DIAGNOSIS — I420 Dilated cardiomyopathy: Secondary | ICD-10-CM | POA: Insufficient documentation

## 2020-06-29 DIAGNOSIS — I083 Combined rheumatic disorders of mitral, aortic and tricuspid valves: Secondary | ICD-10-CM | POA: Diagnosis not present

## 2020-06-29 LAB — ECHOCARDIOGRAM COMPLETE
Area-P 1/2: 2.95 cm2
S' Lateral: 2.2 cm

## 2020-06-29 NOTE — Progress Notes (Signed)
Received a message late yesterday afternoon to call patient's wife for update of his visit with the liver specialist.  Mayer Camel and spoke to her. They were very happy with the appointment and felt a positive connection with the physician, Dr Beverley Fiedler (?). After assessment the MD stated that the patient appears to have decreased muscle mass which requires the patient to try to increase protein dietary intake. The physician also ordered additional blood work, a paracentesis and IV albumin infusion. She states that physician has placed the orders and they should be contacted for scheduling. Dr Marin Olp given update.   Patient has an ECHO today and then a follow up with our office on 07/05/20.

## 2020-06-29 NOTE — Progress Notes (Signed)
Echocardiogram 2D Echocardiogram has been performed.  Oneal Deputy Balinda Heacock 06/29/2020, 11:39 AM

## 2020-06-30 ENCOUNTER — Other Ambulatory Visit: Payer: Self-pay | Admitting: Nurse Practitioner

## 2020-06-30 DIAGNOSIS — K7460 Unspecified cirrhosis of liver: Secondary | ICD-10-CM

## 2020-07-01 ENCOUNTER — Ambulatory Visit (HOSPITAL_COMMUNITY)
Admission: RE | Admit: 2020-07-01 | Discharge: 2020-07-01 | Disposition: A | Discharge status: Home / Self Care | Payer: Medicare HMO | Source: Ambulatory Visit | Attending: Nurse Practitioner | Admitting: Nurse Practitioner

## 2020-07-01 ENCOUNTER — Other Ambulatory Visit: Payer: Self-pay

## 2020-07-01 DIAGNOSIS — R188 Other ascites: Secondary | ICD-10-CM

## 2020-07-01 MED ORDER — ALBUMIN HUMAN 25 % IV SOLN: 12.5000 g | Freq: Once | INTRAVENOUS | Status: DC

## 2020-07-01 MED ORDER — ALBUMIN HUMAN 25 % IV SOLN
25.0000 g | Freq: Once | INTRAVENOUS | Status: AC
  Administered 2020-07-01: 25 g via INTRAVENOUS
  Filled 2020-07-01: qty 100

## 2020-07-01 MED ORDER — LIDOCAINE HCL 1 % IJ SOLN
INTRAMUSCULAR | Status: AC
  Filled 2020-07-01: qty 20

## 2020-07-01 NOTE — Procedures (Signed)
PROCEDURE SUMMARY:  Successful US guided paracentesis from right lateral abdomen.  Yielded 3.5 liters of yellow fluid.  No immediate complications.  Pt tolerated well.   Specimen was not sent for labs. Patient to receive 25g albumin.   EBL < 74mL  Docia Barrier PA-C 07/01/2020 4:37 PM

## 2020-07-04 ENCOUNTER — Ambulatory Visit (HOSPITAL_COMMUNITY): Payer: Medicare HMO

## 2020-07-05 ENCOUNTER — Other Ambulatory Visit: Payer: Self-pay

## 2020-07-05 ENCOUNTER — Inpatient Hospital Stay (HOSPITAL_BASED_OUTPATIENT_CLINIC_OR_DEPARTMENT_OTHER): Payer: Medicare HMO | Admitting: Hematology & Oncology

## 2020-07-05 ENCOUNTER — Inpatient Hospital Stay: Payer: Medicare HMO

## 2020-07-05 ENCOUNTER — Encounter: Payer: Self-pay | Admitting: *Deleted

## 2020-07-05 VITALS — BP 117/59 | HR 88 | Temp 98.3°F | Resp 19 | Wt 122.5 lb

## 2020-07-05 DIAGNOSIS — C7B02 Secondary carcinoid tumors of liver: Secondary | ICD-10-CM | POA: Diagnosis not present

## 2020-07-05 DIAGNOSIS — D3A098 Benign carcinoid tumors of other sites: Secondary | ICD-10-CM | POA: Diagnosis not present

## 2020-07-05 DIAGNOSIS — C7A098 Malignant carcinoid tumors of other sites: Secondary | ICD-10-CM | POA: Diagnosis not present

## 2020-07-05 DIAGNOSIS — R188 Other ascites: Secondary | ICD-10-CM | POA: Diagnosis not present

## 2020-07-05 DIAGNOSIS — K766 Portal hypertension: Secondary | ICD-10-CM | POA: Diagnosis not present

## 2020-07-05 LAB — CBC WITH DIFFERENTIAL (CANCER CENTER ONLY)
Abs Immature Granulocytes: 0.1 10*3/uL — ABNORMAL HIGH (ref 0.00–0.07)
Basophils Absolute: 0.1 10*3/uL (ref 0.0–0.1)
Basophils Relative: 1 %
Eosinophils Absolute: 0.1 10*3/uL (ref 0.0–0.5)
Eosinophils Relative: 1 %
HCT: 44.2 % (ref 39.0–52.0)
Hemoglobin: 15 g/dL (ref 13.0–17.0)
Immature Granulocytes: 1 %
Lymphocytes Relative: 12 %
Lymphs Abs: 1.7 10*3/uL (ref 0.7–4.0)
MCH: 34.3 pg — ABNORMAL HIGH (ref 26.0–34.0)
MCHC: 33.9 g/dL (ref 30.0–36.0)
MCV: 101.1 fL — ABNORMAL HIGH (ref 80.0–100.0)
Monocytes Absolute: 1.5 10*3/uL — ABNORMAL HIGH (ref 0.1–1.0)
Monocytes Relative: 11 %
Neutro Abs: 10.7 10*3/uL — ABNORMAL HIGH (ref 1.7–7.7)
Neutrophils Relative %: 74 %
Platelet Count: 316 10*3/uL (ref 150–400)
RBC: 4.37 MIL/uL (ref 4.22–5.81)
RDW: 15.3 % (ref 11.5–15.5)
WBC Count: 14.3 10*3/uL — ABNORMAL HIGH (ref 4.0–10.5)
nRBC: 0 % (ref 0.0–0.2)

## 2020-07-05 LAB — CMP (CANCER CENTER ONLY)
ALT: 27 U/L (ref 0–44)
AST: 43 U/L — ABNORMAL HIGH (ref 15–41)
Albumin: 3.8 g/dL (ref 3.5–5.0)
Alkaline Phosphatase: 224 U/L — ABNORMAL HIGH (ref 38–126)
Anion gap: 10 (ref 5–15)
BUN: 49 mg/dL — ABNORMAL HIGH (ref 8–23)
CO2: 28 mmol/L (ref 22–32)
Calcium: 10.1 mg/dL (ref 8.9–10.3)
Chloride: 97 mmol/L — ABNORMAL LOW (ref 98–111)
Creatinine: 1.96 mg/dL — ABNORMAL HIGH (ref 0.61–1.24)
GFR, Est AFR Am: 36 mL/min — ABNORMAL LOW (ref >60)
GFR, Estimated: 31 mL/min — ABNORMAL LOW (ref >60)
Glucose, Bld: 123 mg/dL — ABNORMAL HIGH (ref 70–99)
Potassium: 4.7 mmol/L (ref 3.5–5.1)
Sodium: 135 mmol/L (ref 135–145)
Total Bilirubin: 1.3 mg/dL — ABNORMAL HIGH (ref 0.3–1.2)
Total Protein: 6.8 g/dL (ref 6.5–8.1)

## 2020-07-05 LAB — LACTATE DEHYDROGENASE: LDH: 140 U/L (ref 98–192)

## 2020-07-05 MED ORDER — LANREOTIDE ACETATE 120 MG/0.5ML ~~LOC~~ SOLN
120.0000 mg | Freq: Once | SUBCUTANEOUS | Status: AC
  Administered 2020-07-05: 120 mg via SUBCUTANEOUS

## 2020-07-05 MED ORDER — FLUCONAZOLE 100 MG PO TABS
100.0000 mg | ORAL_TABLET | Freq: Every day | ORAL | 3 refills | Status: DC
Start: 2020-07-05 — End: 2020-07-29

## 2020-07-05 NOTE — Patient Instructions (Signed)
Lanreotide injection What is this medicine? LANREOTIDE (lan REE oh tide) is used to reduce blood levels of growth hormone in patients with a condition called acromegaly. It also works to slow or stop tumor growth in patients with neuroendocrine tumors and treat carcinoid syndrome. This medicine may be used for other purposes; ask your health care provider or pharmacist if you have questions. COMMON BRAND NAME(S): Somatuline [place] What should I tell my health care provider before I take this medicine? They need to know if you have any of these conditions:  diabetes  gallbladder disease  heart disease  kidney disease  liver disease  thyroid disease  an unusual or allergic reaction to lanreotide, other medicines, foods, dyes, or preservatives  pregnant or trying to get pregnant  breast-feeding How should I use this medicine? This medicine is for injection under the skin. It is given by a health care professional in a hospital or clinic setting. Contact your pediatrician or health care professional regarding the use of this medicine in children. Special care may be needed. Overdosage: If you think you have taken too much of this medicine contact a poison control center or emergency room at once. NOTE: This medicine is only for you. Do not share this medicine with others. What if I miss a dose? It is important not to miss your dose. Call your doctor or health care professional if you are unable to keep an appointment. What may interact with this medicine? This medicine may interact with the following medications:  bromocriptine  cyclosporine  certain medicines for blood pressure, heart disease, irregular heart beat  certain medicines for diabetes  quinidine  terfenadine This list may not describe all possible interactions. Give your health care provider a list of all the medicines, herbs, non-prescription drugs, or dietary supplements you use. Also tell them if you smoke,  drink alcohol, or use illegal drugs. Some items may interact with your medicine. What should I watch for while using this medicine? Tell your doctor or healthcare professional if your symptoms do not start to get better or if they get worse. Visit your doctor or health care professional for regular checks on your progress. Your condition will be monitored carefully while you are receiving this medicine. This medicine may increase blood sugar. Ask your healthcare provider if changes in diet or medicines are needed if you have diabetes. You may need blood work done while you are taking this medicine. Women should inform their doctor if they wish to become pregnant or think they might be pregnant. There is a potential for serious side effects to an unborn child. Talk to your health care professional or pharmacist for more information. Do not breast-feed an infant while taking this medicine or for 6 months after stopping it. This medicine has caused ovarian failure in some women. This medicine may interfere with the ability to have a child. Talk with your doctor or health care professional if you are concerned about your fertility. What side effects may I notice from receiving this medicine? Side effects that you should report to your doctor or health care professional as soon as possible:  allergic reactions like skin rash, itching or hives, swelling of the face, lips, or tongue  increased blood pressure  severe stomach pain  signs and symptoms of hgh blood sugar such as being more thirsty or hungry or having to urinate more than normal. You may also feel very tired or have blurry vision.  signs and symptoms of low blood   sugar such as feeling anxious; confusion; dizziness; increased hunger; unusually weak or tired; sweating; shakiness; cold; irritable; headache; blurred vision; fast heartbeat; loss of consciousness  unusually slow heartbeat Side effects that usually do not require medical  attention (report to your doctor or health care professional if they continue or are bothersome):  constipation  diarrhea  dizziness  headache  muscle pain  muscle spasms  nausea  pain, redness, or irritation at site where injected This list may not describe all possible side effects. Call your doctor for medical advice about side effects. You may report side effects to FDA at 1-800-FDA-1088. Where should I keep my medicine? This drug is given in a hospital or clinic and will not be stored at home. NOTE: This sheet is a summary. It may not cover all possible information. If you have questions about this medicine, talk to your doctor, pharmacist, or health care provider.  2020 Elsevier/Gold Standard (2018-09-04 09:13:08)  

## 2020-07-05 NOTE — Addendum Note (Signed)
Addended by: Burney Gauze R on: 07/05/2020 01:37 PM   Modules accepted: Orders

## 2020-07-05 NOTE — Progress Notes (Signed)
Oncology Nurse Navigator Documentation  Oncology Nurse Navigator Flowsheets 07/05/2020  Abnormal Finding Date -  Confirmed Diagnosis Date -  Diagnosis Status -  Planned Course of Treatment -  Phase of Treatment -  Hormonal Therapy Pending- Reason: -  Hormonal Actual Start Date: -  Navigator Follow Up Date: 08/03/2020  Navigator Follow Up Reason: Follow-up Appointment  Navigator Location [facility]  Referral Date to RadOnc/MedOnc -  Navigator Encounter Type Follow-up Appt  Telephone -  Treatment Initiated Date -  Patient Visit Type MedOnc  Treatment Phase Active Tx  Barriers/Navigation Needs Anxiety;Family Concerns;Morbidities/Frailty;Pain  Education -  Interventions Psycho-Social Support  Acuity Level 3-Moderate Needs (3-4 Barriers Identified)  Referrals -  Coordination of Care -  Education Method -  Support Groups/Services Friends and Family  Time Spent with Patient 52

## 2020-07-05 NOTE — Progress Notes (Signed)
Hematology and Oncology Follow Up Visit  Bruce Gray [id number] 24-Dec-1940 79 y.o. 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  Unfortunately, he comes in a wheelchair.  Despite everything we are doing, he is just declining.  I had to believe that is a good decline is all from his liver.  He had another paracentesis on 07/01/2020.  3.5 L of fluid was taken off.  This is a transudate.  There is no evidence of malignancy.  He did have an MRI done of his abdomen on 06/17/2020.  This showed essentially stable liver lesions from the carcinoid.  He has slightly improved pancreatic mass.  There is no splenomegaly.  There is no adenopathy.  His bones looked okay.  He also has had an echocardiogram.  This was done on 06/29/2020.  This showed an ejection fraction of 55-60%.  He had some trivial valvular regurgitation.  The inferior vena cava was normal in size.  He had mild left ventricular hypertrophy.  Right ventricular function was not well visualized.    His chromogranin A level was 2100.  Hopefully, it will be lower.  He really is not a candidate for anything more aggressive.  Again, by pathology, the carcinoid certainly does not appear to be high-grade.  We are trying to cut back as many medications as possible.  I do not think he needs the atorvastatin.  I do not think he needs the Metformin right now.  We will also get him off the spironolactone.  We are trying to get his strength increased.  His appetite gets a little bit better but with the ascites, goes down.  He might need a peritoneal catheter to be placed for ascites removal.  Gastroenterology and hepatology does not think he would do well with a TIPS procedure.    Currently, his  performance status is ECOG 3.   Medications:  Current Outpatient Medications:  .  acetaminophen (TYLENOL) 500 MG tablet, Take 500 mg by mouth daily as needed for moderate pain or headache., Disp: , Rfl:  .  allopurinol (ZYLOPRIM) 100 MG tablet, Take 100 mg by mouth 2 (two) times daily. , Disp: , Rfl:  .  atorvastatin (LIPITOR) 80 MG tablet, TAKE 1 TABLET EVERY DAY  AT  6PM.  KEEP UPCOMING APPOINTMENT, Disp: 90 tablet, Rfl: 2 .  docusate sodium (COLACE) 100 MG capsule, Take 100 mg by mouth daily as needed for mild constipation. , Disp: , Rfl:  .  enoxaparin (LOVENOX) 40 MG/0.4ML injection, , Disp: , Rfl:  .  furosemide (LASIX) 40 MG tablet, , Disp: , Rfl:  .  metFORMIN (GLUCOPHAGE) 500 MG tablet, Take 500 mg by mouth every evening., Disp: , Rfl:  .  omeprazole (PRILOSEC) 20 MG capsule, Take 1 capsule (20 mg total) by mouth in the morning and at bedtime., Disp: 180 capsule, Rfl: 3 .  spironolactone (ALDACTONE) 50 MG tablet, , Disp: , Rfl:   Allergies:  Allergies  Allergen Reactions  . Codeine Nausea And Vomiting  . Lansoprazole Diarrhea and Nausea And Vomiting    Past Medical History, Surgical history, Social history, and Family History were reviewed and updated.  Review of Systems: Review of Systems  Constitutional: Negative.   HENT:  Negative.  Eyes: Negative.   Respiratory: Negative.   Cardiovascular: Negative.   Gastrointestinal: Positive for diarrhea.  Endocrine: Negative.   Genitourinary: Negative.    Musculoskeletal: Negative.   Skin: Negative.   Neurological: Negative.   Hematological: Negative.   Psychiatric/Behavioral: Negative.     Physical Exam:  weight is 122 lb 8 oz (55.6 kg). His oral temperature is 98.3 F (36.8 C). His blood pressure is 117/59 (abnormal) and his pulse is 88. His respiration is 19 and oxygen saturation is 100%.   Wt Readings from Last 3 Encounters:  07/05/20 122 lb 8 oz (55.6 kg)  06/09/20 130 lb (59 kg)  05/30/20 144 lb (65.3 kg)     Physical Exam Vitals reviewed.  HENT:     Head: Normocephalic and atraumatic.  Eyes:     Pupils: Pupils are equal, round, and reactive to light.  Cardiovascular:     Rate and Rhythm: Normal rate and regular rhythm.     Heart sounds: Normal heart sounds.  Pulmonary:     Effort: Pulmonary effort is normal.     Breath sounds: Normal breath sounds.  Abdominal:     General: Bowel sounds are normal.     Palpations: Abdomen is soft.     Comments: His abdomen is somewhat distended.  There is a fluid wave.  There is no guarding or rebound tenderness.  There is no obvious hepatosplenomegaly.  Musculoskeletal:        General: No tenderness or deformity. Normal range of motion.     Cervical back: Normal range of motion.  Lymphadenopathy:     Cervical: No cervical adenopathy.  Skin:    General: Skin is warm and dry.     Findings: No erythema or rash.  Neurological:     Mental Status: He is alert and oriented to person, place, and time.  Psychiatric:        Behavior: Behavior normal.        Thought Content: Thought content normal.        Judgment: Judgment normal.    Lab Results  Component Value Date   WBC 14.3 (H) 07/05/2020   HGB 15.0 07/05/2020   HCT 44.2 07/05/2020   MCV 101.1 (H) 07/05/2020   PLT 316 07/05/2020     Chemistry      Component Value Date/Time   NA 135 07/05/2020 1155   NA 139 02/03/2018 0843   K 4.7 07/05/2020 1155   CL 97 (L) 07/05/2020 1155   CO2 28 07/05/2020 1155   BUN 49 (H) 07/05/2020 1155   BUN 22 02/03/2018 0843   CREATININE 1.96 (H) 07/05/2020 1155      Component Value Date/Time   CALCIUM 10.1 07/05/2020 1155   ALKPHOS 224 (H) 07/05/2020 1155   AST 43 (H) 07/05/2020 1155   ALT 27 07/05/2020 1155   BILITOT 1.3 (H) 07/05/2020 1155       Impression and Plan: Bruce Gray is a very nice 79 year old white male.  He has metastatic carcinoid.   I just wish that his performance status would improve.  It is just not getting better.  He sees a  very good hepatologist.  He does not see her until the end of August again.  We will do the Somatuline today.  We will have to monitor him closely.  We will have him come back in another 3-4 weeks.  They will certainly can let me know how things are going.    Volanda Napoleon, MD 7/27/202112:56 PM

## 2020-07-06 DIAGNOSIS — H269 Unspecified cataract: Secondary | ICD-10-CM | POA: Diagnosis not present

## 2020-07-06 DIAGNOSIS — I2510 Atherosclerotic heart disease of native coronary artery without angina pectoris: Secondary | ICD-10-CM | POA: Diagnosis not present

## 2020-07-06 DIAGNOSIS — Z8546 Personal history of malignant neoplasm of prostate: Secondary | ICD-10-CM | POA: Diagnosis not present

## 2020-07-06 DIAGNOSIS — N189 Chronic kidney disease, unspecified: Secondary | ICD-10-CM | POA: Diagnosis not present

## 2020-07-06 DIAGNOSIS — E785 Hyperlipidemia, unspecified: Secondary | ICD-10-CM | POA: Diagnosis not present

## 2020-07-06 DIAGNOSIS — I10 Essential (primary) hypertension: Secondary | ICD-10-CM | POA: Diagnosis not present

## 2020-07-06 DIAGNOSIS — E119 Type 2 diabetes mellitus without complications: Secondary | ICD-10-CM | POA: Diagnosis not present

## 2020-07-06 DIAGNOSIS — E7800 Pure hypercholesterolemia, unspecified: Secondary | ICD-10-CM | POA: Diagnosis not present

## 2020-07-06 DIAGNOSIS — E1169 Type 2 diabetes mellitus with other specified complication: Secondary | ICD-10-CM | POA: Diagnosis not present

## 2020-07-06 LAB — CHROMOGRANIN A: Chromogranin A (ng/mL): 2812 ng/mL — ABNORMAL HIGH (ref 0.0–101.8)

## 2020-07-20 ENCOUNTER — Other Ambulatory Visit: Payer: Self-pay

## 2020-07-20 ENCOUNTER — Ambulatory Visit
Admission: RE | Admit: 2020-07-20 | Discharge: 2020-07-20 | Disposition: A | Discharge status: Home / Self Care | Payer: Medicare HMO | Source: Ambulatory Visit | Attending: Nurse Practitioner | Admitting: Nurse Practitioner

## 2020-07-20 ENCOUNTER — Encounter: Payer: Self-pay | Admitting: *Deleted

## 2020-07-20 DIAGNOSIS — R188 Other ascites: Secondary | ICD-10-CM

## 2020-07-20 DIAGNOSIS — C7A1 Malignant poorly differentiated neuroendocrine tumors: Secondary | ICD-10-CM | POA: Diagnosis not present

## 2020-07-20 HISTORY — PX: IR RADIOLOGIST EVAL & MGMT: IMG5224

## 2020-07-20 NOTE — Consult Note (Signed)
Chief Complaint: Recurrent ascites  Referring Physician(s): Drazek,Dawn  Oncology: Dr. Marin Olp  History of Present Illness: Bruce Gray is a 79 y.o. male presenting today as a scheduled consultation for recurrent ascites, kindly referred by Roosevelt Locks of [employer].   Bruce Gray joins [place] today via telemedicine conference, and his wife joins him on the phone call.  We confirmed his identity using 2 personal identifiers.   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  In fact, I performed his biopsy of the liver on 02/16/20.  The pathology is : Well-differentiated neuroendocrine tumor.   He then started his oncology care with Dr. Marin Olp.    He began having ascites in June, with several paracentesis performed over the past couple of months.  Cytology samples have been negative for sign of mets (05/20/20), and negative for infection (05/20/20).    He denies any excessive ETOH use in his life.  He denies any knowledge of hepatitis infection.   His 2 MRI studies do not have any overt signs of cirrhotic changes.  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    He does have a positive cardiac history, with prior CABG, though he had ECHO in January of 19 with normal EF. He does not have history of CHF.   I would estimate an ECOG 1, as he remains active.  He does have early satiety, he says secondary to the ascites, and he also has had unintended weight loss over the past few months. He denies any other symptoms such as hematemesis, BRBPR/melana, N/V/D.    MELD calculated as 17, secondary to the creatinine.   Chromogranin A is most recently 2812, which has increased since 1 month ago. .     Past Medical History:  Diagnosis Date  . CAD (coronary artery disease)   . 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 Complex sleep apnea syndrome    does not use a cpap-cannot  . Diabetes mellitus ([place])    type 2    . Gout   . Heart disease   . High cholesterol   . HOH (hard of hearing)   . Hypertension   . Liver disease   . Mild renal insufficiency   . Prostate cancer ([place])    seed implantation    Past Surgical History:  Procedure Laterality Date  . CARDIAC CATHETERIZATION  86,2010  . CARDIOVASCULAR STRESS TEST  2008  . CHOLECYSTECTOMY  2011  . COLONOSCOPY    . CORONARY ARTERY BYPASS GRAFT  1986  . DOPPLER ECHOCARDIOGRAPHY     mild concentric LVH with mild grade 1 diastolic dysfunction with normal tissue doppler parameters  . HEMORRHOID SURGERY  2007  . INSERTION PROSTATE RADIATION SEED  2013  . KNEE ARTHROSCOPY WITH MEDIAL MENISECTOMY Left 08/20/2014   Procedure: LEFT KNEE ARTHROSCOPY WITH DEBRIDEMENT/SHAVING (CHONDROPLASTY), MEDIAL MENISECTOMY, EXCISION OF PLICA;  Surgeon: Ninetta Lights, MD;  Location: [facility];  Service: Orthopedics;  Laterality: Left;  . TONSILLECTOMY      Allergies: Codeine and Lansoprazole  Medications: Prior to Admission medications   Medication Sig Start Date End Date Taking? Authorizing Provider  acetaminophen (TYLENOL) 500 MG tablet Take 500 mg by mouth daily as needed for moderate pain or headache.    [provider]  docusate sodium (COLACE) 100 MG capsule Take 100 mg by mouth daily as needed for mild constipation.     [provider]  fluconazole (DIFLUCAN) 100 MG tablet Take 1 tablet (100 mg total) by mouth daily. 07/05/20   Volanda Napoleon, MD  furosemide (LASIX) 40 MG tablet  06/09/20   [provider]  omeprazole (PRILOSEC) 20 MG capsule Take 1 capsule (20 mg total) by mouth in the morning and at bedtime. 03/21/20   Troy Sine, MD     Family History  Problem Relation Age of Onset  . Cancer - Other Mother        breast  . Stroke Father     Social History   Socioeconomic History  . Marital status: Married    Spouse name: Not on file  . Number of children: 2  . Years of education: 45  . Highest  education level: Not on file  Occupational History  . Occupation: retired  Tobacco Use  . Smoking status: Former Smoker    Quit date: 07/03/1958    Years since quitting: 62.0  . Smokeless tobacco: Never Used  Vaping Use  . Vaping Use: Never used  Substance and Sexual Activity  . Alcohol use: No    Comment: occasional  . Drug use: No  . Sexual activity: Not on file  Other Topics Concern  . Not on file  Social History Narrative  . Not on file   Social Determinants of Health   Financial Resource Strain:   . Difficulty of Paying Living Expenses:   Food Insecurity:   . Worried About Charity fundraiser in the Last Year:   . Arboriculturist in the Last Year:   Transportation Needs:   . Film/video editor (Medical):   Marland Kitchen Lack of Transportation (Non-Medical):   Physical Activity:   . Days of Exercise per Week:   . Minutes of Exercise per Session:   Stress:   . Feeling of Stress :   Social Connections:   . Frequency of Communication with Friends and Family:   . Frequency of Social Gatherings with Friends and Family:   . Attends Religious Services:   . Active Member of Clubs or Organizations:   . Attends Archivist Meetings:   Marland Kitchen Marital Status:     ECOG Status: 1 - Symptomatic but completely ambulatory  Review of Systems  Review of Systems: A 12 point ROS discussed and pertinent positives are indicated in the HPI above.  All other systems are negative.  Physical Exam No direct physical exam was performed (except for noted visual exam findings with Video Visits).    Vital Signs: There were no vitals taken for this visit.  Imaging: US Paracentesis  Result Date: 07/01/2020 INDICATION: Patient with pancreatic carcinoid carcinoma metastatic to the liver, recurrent ascites. Request is made for therapeutic paracentesis. Patient to receive albumin with procedure today. EXAM: ULTRASOUND GUIDED THERAPEUTIC PARACENTESIS MEDICATIONS: 10 mL 1% lidocaine COMPLICATIONS:  None immediate. PROCEDURE: Informed written consent was obtained from the patient after a discussion of the risks, benefits and alternatives to treatment. A timeout was performed prior to the initiation of the procedure. Initial ultrasound scanning demonstrates a large amount of ascites within the right lower abdominal quadrant. The right lower abdomen was prepped and draped in the usual sterile fashion. 1% lidocaine was used for local anesthesia. Following this, a 19 gauge, 7-cm, Yueh catheter was introduced. An ultrasound image was saved for documentation purposes. The paracentesis was performed. The catheter was removed and a dressing was applied. The patient tolerated the procedure well without immediate post procedural complication. Patient received  post-procedure intravenous albumin; see nursing notes for details. FINDINGS: A total of approximately 3.5 liters of yellow fluid was removed. IMPRESSION: Successful ultrasound-guided therapeutic paracentesis yielding 3.5 liters of peritoneal fluid. Read by: Brynda Greathouse PA-C Electronically Signed   By: Corrie Mckusick D.O.   On: 07/01/2020 16:41   ECHOCARDIOGRAM COMPLETE  Result Date: 06/29/2020    ECHOCARDIOGRAM REPORT   Patient Name:   Bruce Gray Date of Exam: 06/29/2020 Medical Rec #:  [id number]      Height:       65.0 in Accession #:    [id number]     Weight:       130.0 lb Date of Birth:  January 05, 1941      BSA:          1.647 m Patient Age:    19 years       BP:           108/64 mmHg Patient Gender: M              HR:           76 bpm. 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  Sonographer:    Raquel Sarna Senior RDCS Referring Phys: [place] Comments: Specifically ordered to check for carcinoid heart disease. IMPRESSIONS  1. Left ventricular  ejection fraction, by estimation, is 55 to 60%. The left ventricle has normal function. The left ventricle has no regional wall motion abnormalities. There is mild left ventricular hypertrophy of the basal-septal segment. Left ventricular diastolic parameters are consistent with Grade I diastolic dysfunction (impaired relaxation).  2. Right ventricular systolic function was not well visualized. The right ventricular size is not well visualized. Tricuspid regurgitation signal is inadequate for assessing PA pressure.  3. The mitral valve is normal in structure. Mild mitral valve regurgitation. No evidence of mitral stenosis.  4. The aortic valve is tricuspid. Aortic valve regurgitation is trivial. No aortic stenosis is present.  5. The inferior vena cava is normal in size with greater than 50% respiratory variability, suggesting right atrial pressure of 3 mmHg. FINDINGS  Left Ventricle: Left ventricular ejection fraction, by estimation, is 55 to 60%. The left ventricle has normal function. The left ventricle has no regional wall motion abnormalities. The left ventricular internal cavity size was normal in size. There is  mild left ventricular hypertrophy of the basal-septal segment. Left ventricular diastolic parameters are consistent with Grade I diastolic dysfunction (impaired relaxation). Normal left ventricular filling pressure. Right Ventricle: The right ventricular size is not well visualized. Right vetricular wall thickness was not assessed. Right ventricular systolic function was not well visualized. Tricuspid regurgitation signal is inadequate for assessing PA pressure. Left Atrium: Left atrial size was normal in size. Right Atrium: Right atrial size was normal in size. Pericardium: There is no evidence of pericardial effusion. Mitral Valve: The mitral valve is normal in structure. Normal mobility of the mitral valve leaflets. Mild mitral valve regurgitation. No evidence of mitral valve stenosis. Tricuspid  Valve: The tricuspid valve is normal in structure. Tricuspid valve regurgitation is not demonstrated. No evidence of tricuspid stenosis. Aortic Valve: The aortic valve is tricuspid. . There is moderate thickening and moderate calcification of the aortic  valve. Aortic valve regurgitation is trivial. No aortic stenosis is present. There is moderate thickening of the aortic valve. There is moderate calcification of the aortic valve. Pulmonic Valve: The pulmonic valve was normal in structure. Pulmonic valve regurgitation is not visualized. No evidence of pulmonic stenosis. Aorta: The aortic root is normal in size and structure. Venous: The inferior vena cava is normal in size with greater than 50% respiratory variability, suggesting right atrial pressure of 3 mmHg. IAS/Shunts: No atrial level shunt detected by color flow Doppler.  LEFT VENTRICLE PLAX 2D LVIDd:         3.90 cm  Diastology LVIDs:         2.20 cm  LV e' lateral:   6.42 cm/s LV PW:         0.90 cm  LV E/e' lateral: 4.6 LV IVS:        1.30 cm  LV e' medial:    5.44 cm/s LVOT diam:     2.00 cm  LV E/e' medial:  5.4 LV SV:         41 LV SV Index:   25 LVOT Area:     3.14 cm  RIGHT VENTRICLE RV S prime:     5.87 cm/s TAPSE (M-mode): 0.7 cm LEFT ATRIUM             Index       RIGHT ATRIUM          Index LA diam:        3.40 cm 2.06 cm/m  RA Area:     6.77 cm LA Vol (A2C):   23.4 ml 14.21 ml/m RA Volume:   7.57 ml  4.60 ml/m LA Vol (A4C):   24.7 ml 15.00 ml/m LA Biplane Vol: 25.2 ml 15.30 ml/m  AORTIC VALVE LVOT Vmax:   67.20 cm/s LVOT Vmean:  49.500 cm/s LVOT VTI:    0.130 m  AORTA Ao Root diam: 3.40 cm MITRAL VALVE MV Area (PHT): 2.95 cm    SHUNTS MV Decel Time: 257 msec    Systemic VTI:  0.13 m MV E velocity: 29.60 cm/s  Systemic Diam: 2.00 cm MV A velocity: 87.40 cm/s MV E/A ratio:  0.34 Fransico Him MD Electronically signed by Fransico Him MD Signature Date/Time: 06/29/2020/3:40:58 PM    Final     Labs:  CBC: Recent Labs    05/11/20 1416  05/30/20 1125 06/09/20 1458 07/05/20 1155  WBC 12.5* 12.2* 11.7* 14.3*  HGB 12.7* 13.9 13.1 15.0  HCT 38.0* 42.1 39.8 44.2  PLT 219 302 280 316    COAGS: Recent Labs    02/16/20 1126 05/30/20 1125  INR 1.1 1.1    BMP: Recent Labs    05/11/20 1416 05/30/20 1125 06/09/20 1458 07/05/20 1155  NA 139 139 137 135  K 4.7 4.9 4.5 4.7  CL 106 105 104 97*  CO2 _0 GLUCOSE 117* 121* 139* 123*  BUN 22 29* 30* 49*  CALCIUM 9.3 9.2 8.4* 10.1  CREATININE 1.58* 1.57* 2.03* 1.96*  GFRNONAA 41* 41* 30* 31*  GFRAA 47* 47* 35* 36*    LIVER FUNCTION TESTS: Recent Labs    05/11/20 1416 05/30/20 1125 06/09/20 1458 07/05/20 1155  BILITOT 1.1 1.0 1.0 1.3*  AST 35 29 31 43*  ALT _1 ALKPHOS 222* 191* 162* 224*  PROT 6.5 6.5 6.2* 6.8  ALBUMIN 3.6 3.4* 2.8* 3.8    TUMOR MARKERS: No results for input(s): AFPTM,  CEA, CA199, CHROMGRNA in the last 8760 hours.  Assessment and Plan:  Bruce Gray is a very pleasant 79 yo male with recurrent ascites, and the need over the past 2 months to undergo 4 large volume paracentesis.   His history is positive for metastatic neuroendocrine carcinoma of pancreas, well-differentiated, and he has a negative history for risk factors for cirrhosis/portal HTN, and negative MRI for changes of cirrhosis.    Regarding etiology of the ascites, it seems we do not yet have a reason.  Without cirrhotic changes and risk factors, I think it is very low likelihood that portal HTN is causing ascites. 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   Despite negative ascites cytology, it seems his metastatic neuroendocrine is still the most likely etiology.    Proving/disproving a portal HTN problem would necessitate transjugular portal pressure measurements and possible medical liver biopsy (for cirrhotic  changes), and I did discuss this with Bruce. Gray.  He is very hesitant to undergo a lot of invasive procedures, and so I think it is reasonable to defer this for now, as I do think cirrhosis/portal HTN is low likelihood.    I let him know that in order to formulate the best treatment plan for him for the ascites, it would be best if we knew the cause (portal HTN vs metastatic disease vs other/cardiac), and that trans-jugular pressure measurements might play a role in diagnostics.  He was not interested in that test at this time.   I had a cursory discussion with him regarding managing strategies of recurrent/refractory ascites, which would include 1) medical management/diuretics, 2) continuing occasional large volume paracentesis (LVP) 3) Trans-jugular intra-hepatic portal-systemic shunt (TIPS) 4) [place] shunt, and/or 5) externalized peritoneal shunt/"pleurex"  1) I think, given his borderline labs, that continuing medical therapy/diuretics will play limited role for ascites management going forward.   2) He is not interested in continuing LVP's, as they are inconvenient and uncomfortable.   3) TIPS -- I cannot recommend this as solution without proving portal HTN, and ultimately I think this is not highly likely 4) [place] Shunt -- This could play a role for him, and I gave him a brief overview of the DS. We will test upcoming ascites fluid for any sign of infection, as this would exclude him from candidacy.   5) Externalized shunt -- I did let him know that this solution is also feasible, but I typically would not recommend this as solution unless he were satisfied with the hastening of deterioration, as this will lead to faster dehydration and ultimately hasten end-of life.  I did let him know for some people this is ok, as they are comfortable, easy, and convenient.  He was not interested in this at this time.   I also had a conversation with him regarding the possibility of candidacy for NM therapy,  specifically Lutathera, which is IV treatment that targets the specific neuroendocrine mets throughout the body.  My partner, Dr. Monico Blitz, is treating individuals who are candidates, and is willing to see the patient.  I think this is worth considering as option, as we might see regression in his ascites altogether (if secondary to malignancy) and would thus preclude any invasive procedure.  He was willing to explore this, if a candidate.   Plan: - I let the patient know I would suggest  consideration of evaluation of candidacy for NM Lutathera, as above. If an option, might preclude need for any further ascites intervention - Could further consider [place] Shunt as option for ascites management - Send ascites for total protein, culture, Alk Phos, Cell Count on next appointment -  Would defer trans-jugular pressure measurements for now, based on conversation with patient. - We will follow up with visit after above to review and discuss further  Thank you for this interesting consult.  I greatly enjoyed meeting Bruce Gray and look forward to participating in their care.  A copy of this report was sent to the requesting provider on this date.  Electronically Signed: Corrie Mckusick 07/20/2020, 2:07 PM   I spent a total of  60 Minutes   in remote  clinical consultation, greater than 50% of which was counseling/coordinating care for recurrent/refractory ascites, possible TIPS, possible [place] Shunt, possible externalized peritoneal shunt.    Visit type: Audio only (telephone). Audio (no video) only due to patient's lack of internet/smartphone capability. Alternative for in-person consultation at [facility], [address] [place] [address], [place], [place]. This visit type was conducted due to national recommendations for restrictions regarding the COVID-19 Pandemic (e.g. social distancing).  This format is felt to be most appropriate for this patient at this time.  All issues noted in this document  were discussed and addressed.

## 2020-07-25 ENCOUNTER — Encounter: Payer: Self-pay | Admitting: *Deleted

## 2020-07-29 ENCOUNTER — Other Ambulatory Visit: Payer: Self-pay

## 2020-07-29 ENCOUNTER — Inpatient Hospital Stay: Payer: Medicare HMO | Attending: Hematology & Oncology

## 2020-07-29 ENCOUNTER — Encounter: Payer: Self-pay | Admitting: *Deleted

## 2020-07-29 ENCOUNTER — Inpatient Hospital Stay: Payer: Medicare HMO

## 2020-07-29 ENCOUNTER — Ambulatory Visit
Admission: RE | Admit: 2020-07-29 | Discharge: 2020-07-29 | Disposition: A | Discharge status: Home / Self Care | Payer: Medicare HMO | Source: Ambulatory Visit | Attending: Hematology & Oncology | Admitting: Hematology & Oncology

## 2020-07-29 ENCOUNTER — Inpatient Hospital Stay (HOSPITAL_BASED_OUTPATIENT_CLINIC_OR_DEPARTMENT_OTHER): Payer: Medicare HMO | Admitting: Hematology & Oncology

## 2020-07-29 ENCOUNTER — Encounter: Payer: Self-pay | Admitting: Hematology & Oncology

## 2020-07-29 VITALS — BP 130/69 | HR 81 | Temp 98.1°F | Resp 20 | Ht 65.0 in | Wt 132.1 lb

## 2020-07-29 DIAGNOSIS — D3A098 Benign carcinoid tumors of other sites: Secondary | ICD-10-CM

## 2020-07-29 DIAGNOSIS — R188 Other ascites: Secondary | ICD-10-CM | POA: Insufficient documentation

## 2020-07-29 DIAGNOSIS — C7A098 Malignant carcinoid tumors of other sites: Secondary | ICD-10-CM | POA: Diagnosis not present

## 2020-07-29 DIAGNOSIS — C7B02 Secondary carcinoid tumors of liver: Secondary | ICD-10-CM | POA: Diagnosis not present

## 2020-07-29 DIAGNOSIS — R634 Abnormal weight loss: Secondary | ICD-10-CM | POA: Diagnosis not present

## 2020-07-29 DIAGNOSIS — K766 Portal hypertension: Secondary | ICD-10-CM | POA: Insufficient documentation

## 2020-07-29 DIAGNOSIS — C7A8 Other malignant neuroendocrine tumors: Secondary | ICD-10-CM | POA: Diagnosis not present

## 2020-07-29 LAB — CBC WITH DIFFERENTIAL (CANCER CENTER ONLY)
Abs Immature Granulocytes: 0.08 10*3/uL — ABNORMAL HIGH (ref 0.00–0.07)
Basophils Absolute: 0.1 10*3/uL (ref 0.0–0.1)
Basophils Relative: 1 %
Eosinophils Absolute: 0.3 10*3/uL (ref 0.0–0.5)
Eosinophils Relative: 3 %
HCT: 42.5 % (ref 39.0–52.0)
Hemoglobin: 14.3 g/dL (ref 13.0–17.0)
Immature Granulocytes: 1 %
Lymphocytes Relative: 20 %
Lymphs Abs: 2.1 10*3/uL (ref 0.7–4.0)
MCH: 34.3 pg — ABNORMAL HIGH (ref 26.0–34.0)
MCHC: 33.6 g/dL (ref 30.0–36.0)
MCV: 101.9 fL — ABNORMAL HIGH (ref 80.0–100.0)
Monocytes Absolute: 1 10*3/uL (ref 0.1–1.0)
Monocytes Relative: 10 %
Neutro Abs: 6.9 10*3/uL (ref 1.7–7.7)
Neutrophils Relative %: 65 %
Platelet Count: 319 10*3/uL (ref 150–400)
RBC: 4.17 MIL/uL — ABNORMAL LOW (ref 4.22–5.81)
RDW: 14.7 % (ref 11.5–15.5)
WBC Count: 10.6 10*3/uL — ABNORMAL HIGH (ref 4.0–10.5)
nRBC: 0 % (ref 0.0–0.2)

## 2020-07-29 LAB — BODY FLUID CELL COUNT WITH DIFFERENTIAL
Eos, Fluid: 0 %
Lymphs, Fluid: 48 %
Monocyte-Macrophage-Serous Fluid: 43 %
Neutrophil Count, Fluid: 5 %
Other Cells, Fluid: 4 %
Total Nucleated Cell Count, Fluid: 417 cu mm

## 2020-07-29 LAB — CMP (CANCER CENTER ONLY)
ALT: 13 U/L (ref 0–44)
AST: 32 U/L (ref 15–41)
Albumin: 3.3 g/dL — ABNORMAL LOW (ref 3.5–5.0)
Alkaline Phosphatase: 237 U/L — ABNORMAL HIGH (ref 38–126)
Anion gap: 8 (ref 5–15)
BUN: 30 mg/dL — ABNORMAL HIGH (ref 8–23)
CO2: 28 mmol/L (ref 22–32)
Calcium: 9.6 mg/dL (ref 8.9–10.3)
Chloride: 101 mmol/L (ref 98–111)
Creatinine: 1.66 mg/dL — ABNORMAL HIGH (ref 0.61–1.24)
GFR, Est AFR Am: 44 mL/min — ABNORMAL LOW (ref >60)
GFR, Estimated: 38 mL/min — ABNORMAL LOW (ref >60)
Glucose, Bld: 117 mg/dL — ABNORMAL HIGH (ref 70–99)
Potassium: 4.9 mmol/L (ref 3.5–5.1)
Sodium: 137 mmol/L (ref 135–145)
Total Bilirubin: 1 mg/dL (ref 0.3–1.2)
Total Protein: 6.3 g/dL — ABNORMAL LOW (ref 6.5–8.1)

## 2020-07-29 LAB — LACTATE DEHYDROGENASE: LDH: 175 U/L (ref 98–192)

## 2020-07-29 LAB — AMMONIA: Ammonia: 16 umol/L (ref 9–35)

## 2020-07-29 MED ORDER — LANREOTIDE ACETATE 120 MG/0.5ML ~~LOC~~ SOLN
120.0000 mg | Freq: Once | SUBCUTANEOUS | Status: AC
  Administered 2020-07-29: 120 mg via SUBCUTANEOUS

## 2020-07-29 MED ORDER — ALBUMIN HUMAN 25 % IV SOLN
INTRAVENOUS | Status: AC
  Filled 2020-07-29: qty 100

## 2020-07-29 MED ORDER — ALBUMIN HUMAN 25 % IV SOLN: 25.0000 g | Freq: Once | INTRAVENOUS | Status: DC

## 2020-07-29 MED ORDER — ALBUMIN HUMAN 25 % IV SOLN: 25.0000 g | Freq: Once | INTRAVENOUS | Status: AC

## 2020-07-29 MED ORDER — ALBUMIN HUMAN 25 % IV SOLN
INTRAVENOUS | Status: AC
  Administered 2020-07-29: 25 g via INTRAVENOUS
  Filled 2020-07-29: qty 100

## 2020-07-29 MED ORDER — NYSTATIN 100000 UNIT/ML MT SUSP
5.0000 mL | Freq: Four times a day (QID) | OROMUCOSAL | 2 refills | Status: DC
Start: 2020-07-29 — End: 2020-10-03

## 2020-07-29 MED ORDER — LANREOTIDE ACETATE 120 MG/0.5ML ~~LOC~~ SOLN
SUBCUTANEOUS | Status: AC
  Filled 2020-07-29: qty 120

## 2020-07-29 NOTE — Progress Notes (Signed)
OK to give Somatuline four days early per order of Dr. Marin Olp.

## 2020-07-29 NOTE — Patient Instructions (Signed)
Lanreotide injection What is this medicine? LANREOTIDE (lan REE oh tide) is used to reduce blood levels of growth hormone in patients with a condition called acromegaly. It also works to slow or stop tumor growth in patients with neuroendocrine tumors and treat carcinoid syndrome. This medicine may be used for other purposes; ask your health care provider or pharmacist if you have questions. COMMON BRAND NAME(S): Somatuline [place] What should I tell my health care provider before I take this medicine? They need to know if you have any of these conditions:  diabetes  gallbladder disease  heart disease  kidney disease  liver disease  thyroid disease  an unusual or allergic reaction to lanreotide, other medicines, foods, dyes, or preservatives  pregnant or trying to get pregnant  breast-feeding How should I use this medicine? This medicine is for injection under the skin. It is given by a health care professional in a hospital or clinic setting. Contact your pediatrician or health care professional regarding the use of this medicine in children. Special care may be needed. Overdosage: If you think you have taken too much of this medicine contact a poison control center or emergency room at once. NOTE: This medicine is only for you. Do not share this medicine with others. What if I miss a dose? It is important not to miss your dose. Call your doctor or health care professional if you are unable to keep an appointment. What may interact with this medicine? This medicine may interact with the following medications:  bromocriptine  cyclosporine  certain medicines for blood pressure, heart disease, irregular heart beat  certain medicines for diabetes  quinidine  terfenadine This list may not describe all possible interactions. Give your health care provider a list of all the medicines, herbs, non-prescription drugs, or dietary supplements you use. Also tell them if you smoke,  drink alcohol, or use illegal drugs. Some items may interact with your medicine. What should I watch for while using this medicine? Tell your doctor or healthcare professional if your symptoms do not start to get better or if they get worse. Visit your doctor or health care professional for regular checks on your progress. Your condition will be monitored carefully while you are receiving this medicine. This medicine may increase blood sugar. Ask your healthcare provider if changes in diet or medicines are needed if you have diabetes. You may need blood work done while you are taking this medicine. Women should inform their doctor if they wish to become pregnant or think they might be pregnant. There is a potential for serious side effects to an unborn child. Talk to your health care professional or pharmacist for more information. Do not breast-feed an infant while taking this medicine or for 6 months after stopping it. This medicine has caused ovarian failure in some women. This medicine may interfere with the ability to have a child. Talk with your doctor or health care professional if you are concerned about your fertility. What side effects may I notice from receiving this medicine? Side effects that you should report to your doctor or health care professional as soon as possible:  allergic reactions like skin rash, itching or hives, swelling of the face, lips, or tongue  increased blood pressure  severe stomach pain  signs and symptoms of hgh blood sugar such as being more thirsty or hungry or having to urinate more than normal. You may also feel very tired or have blurry vision.  signs and symptoms of low blood   sugar such as feeling anxious; confusion; dizziness; increased hunger; unusually weak or tired; sweating; shakiness; cold; irritable; headache; blurred vision; fast heartbeat; loss of consciousness  unusually slow heartbeat Side effects that usually do not require medical  attention (report to your doctor or health care professional if they continue or are bothersome):  constipation  diarrhea  dizziness  headache  muscle pain  muscle spasms  nausea  pain, redness, or irritation at site where injected This list may not describe all possible side effects. Call your doctor for medical advice about side effects. You may report side effects to FDA at 1-800-FDA-1088. Where should I keep my medicine? This drug is given in a hospital or clinic and will not be stored at home. NOTE: This sheet is a summary. It may not cover all possible information. If you have questions about this medicine, talk to your doctor, pharmacist, or health care provider.  2020 Elsevier/Gold Standard (2018-09-04 09:13:08)  

## 2020-07-29 NOTE — Progress Notes (Signed)
Hematology and Oncology Follow Up Visit  Bruce Gray [id number] [birth year] 79 y.o. 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  Unfortunately, the ascites keeps recurring.  He keeps getting paracenteses.  So far, we have never found any malignant cells in the ascitic fluid.  He is seen hepatology at [facility].  He is seeing interventional radiology.  I really think that we have to try Lutathera on him.  I really do not see a problem with his renal function.  He cannot eat much because of the ascites.  When the ascites recurs, it just presses down his intestines.  He has lost weight.  His last chromogranin A was up to 2800.  Again I am quite surprised that this is not coming down quickly.  I would have really thought that with Somatuline that he would respond.  He has a well differentiated carcinoid by pathology.  He was told that put in a peritoneal catheter and would shorten his life.  I am not aware of the data regarding this.  I am sure that radiology is up-to-date with this literature.  I really just wish that this ascites would resolve.  Again, he does not have obvious cirrhosis.  He is behaving like he has portal hypertension.  I do not know if there is a thrombus in the portal vein.  He had been on anticoagulation but I really do not think this was all that effective and it was stopped.  I know that he and his wife are doing all that they can to help him.  1 option that we might have is to try to increase the frequency of the Somatuline.  We could do this every 2 weeks according to clinical trials.  He has not had fever.  He has had no bleeding.  He is going to the bathroom okay.  He has had no leg swelling.  He has had no  headache.  He does have a little bit of thrush on his tongue.  I will give him some nystatin mouth rinse.  I would have to say that at the present time, his performance status is no better than ECOG 2.    Medications:  Current Outpatient Medications:  .  docusate sodium (COLACE) 100 MG capsule, Take 100 mg by mouth daily as needed for mild constipation. , Disp: , Rfl:  .  omeprazole (PRILOSEC) 20 MG capsule, Take 1 capsule (20 mg total) by mouth in the morning and at bedtime., Disp: 180 capsule, Rfl: 3 .  furosemide (LASIX) 40 MG tablet, , Disp: , Rfl:  .  nystatin (MYCOSTATIN) 100000 UNIT/ML suspension, Take 5 mLs (500,000 Units total) by mouth 4 (four) times daily. Swish in mouth for 30 seconds, then spit out!! (Patient not taking: Reported on 07/29/2020), Disp: 120 mL, Rfl: 2 No current facility-administered medications for this visit.  Facility-Administered Medications Ordered in Other Visits:  .  albumin human 25 % solution 25 g, 25 g, Intravenous, Once, Niquan Charnley R, MD .  albumin human 25 % solution, , , ,   Allergies:  Allergies  Allergen Reactions  . Codeine Nausea And Vomiting  . Lansoprazole Diarrhea and Nausea And Vomiting    Past Medical History,  Surgical history, Social history, and Family History were reviewed and updated.  Review of Systems: Review of Systems  Constitutional: Negative.   HENT:  Negative.   Eyes: Negative.   Respiratory: Negative.   Cardiovascular: Negative.   Gastrointestinal: Positive for diarrhea.  Endocrine: Negative.   Genitourinary: Negative.    Musculoskeletal: Negative.   Skin: Negative.   Neurological: Negative.   Hematological: Negative.   Psychiatric/Behavioral: Negative.     Physical Exam:  height is 5\' 5"  (1.651 m) and weight is 132 lb 1.3 oz (59.9 kg). His oral temperature is 98.1 F (36.7 C). His blood pressure is 130/69 and his pulse is 81. His respiration is 20 and oxygen saturation is 100%.   Wt Readings from Last 3  Encounters:  07/29/20 132 lb 1.3 oz (59.9 kg)  07/05/20 122 lb 8 oz (55.6 kg)  06/09/20 130 lb (59 kg)    Physical Exam Vitals reviewed.  HENT:     Head: Normocephalic and atraumatic.  Eyes:     Pupils: Pupils are equal, round, and reactive to light.  Cardiovascular:     Rate and Rhythm: Normal rate and regular rhythm.     Heart sounds: Normal heart sounds.  Pulmonary:     Effort: Pulmonary effort is normal.     Breath sounds: Normal breath sounds.  Abdominal:     General: Bowel sounds are normal.     Palpations: Abdomen is soft.     Comments: His abdomen is somewhat distended.  There is a fluid wave.  There is no guarding or rebound tenderness.  There is no obvious hepatosplenomegaly.  Musculoskeletal:        General: No tenderness or deformity. Normal range of motion.     Cervical back: Normal range of motion.  Lymphadenopathy:     Cervical: No cervical adenopathy.  Skin:    General: Skin is warm and dry.     Findings: No erythema or rash.  Neurological:     Mental Status: He is alert and oriented to person, place, and time.  Psychiatric:        Behavior: Behavior normal.        Thought Content: Thought content normal.        Judgment: Judgment normal.    Lab Results  Component Value Date   WBC 10.6 (H) 07/29/2020   HGB 14.3 07/29/2020   HCT 42.5 07/29/2020   MCV 101.9 (H) 07/29/2020   PLT 319 07/29/2020     Chemistry      Component Value Date/Time   NA 137 07/29/2020 0849   NA 139 02/03/2018 0843   K 4.9 07/29/2020 0849   CL 101 07/29/2020 0849   CO2 28 07/29/2020 0849   BUN 30 (H) 07/29/2020 0849   BUN 22 02/03/2018 0843   CREATININE 1.66 (H) 07/29/2020 0849      Component Value Date/Time   CALCIUM 9.6 07/29/2020 0849   ALKPHOS 237 (H) 07/29/2020 0849   AST 32 07/29/2020 0849   ALT 13 07/29/2020 0849   BILITOT 1.0 07/29/2020 0849       Impression and Plan: Bruce Gray is a very nice 79 year old white male.  He has metastatic carcinoid.   I just  wish that his recurrent ascites would resolve.  I does not know how this can happen.  I again I do not believe this is related to the carcinoid.  He had 6.2 L of fluid removed today.  The fluid was sent off for cytology and this  is pending.  He did get Somatuline today.  I will see if interventional radiology will consider him for Lutathera therapy.  I realize he has some mild renal insufficiency but I would like to hope that this is not going to be a contraindication to Lutathera injection.  I will try to increase the Somatuline to every 2 weeks.  Maybe this can help with the chromogranin A level.   I really hope that his nutritional status can improve.  I know this does get better when the ascites is removed.  This is an incredibly complicated.  We spent about 45 minutes with him this morning.  This is just much more difficult than I would have ever thought given that this is a well differentiated carcinoid.      Volanda Napoleon, MD 8/20/20215:00 PM

## 2020-07-29 NOTE — Progress Notes (Signed)
Patient came in earlier than scheduled due to re accumulation of abdominal ascites and need to talk to Dr Marin Olp about plan going forward. They have met with Dr Jacqualyn Posey and were given several options.   Patient continues to have decreased appetite, decreased ability to eat and weight loss associated with the ascites. He and his wife are frustrated with unclear etiology and repeated assessments and treatment. He requests another paracentesis and his wife is curious about using albumin, which was used for a previous paracentesis, which she felt helped the procedure be more effective. Informed Dr Marin Olp about their wish to discuss this.   After speaking with Dr Marin Olp he will get a paracentesis next Monday (already scheduled). He will go ahead and get treated today, early, so that he doesn't have to come back next week as scheduled.   Oncology Nurse Navigator Documentation  Oncology Nurse Navigator Flowsheets 07/29/2020  Abnormal Finding Date -  Confirmed Diagnosis Date -  Diagnosis Status -  Planned Course of Treatment -  Phase of Treatment -  Hormonal Therapy Pending- Reason: -  Hormonal Actual Start Date: -  Navigator Follow Up Date: 08/11/2020  Navigator Follow Up Reason: Follow-up Appointment  Navigator Location [facility]  Referral Date to RadOnc/MedOnc -  Navigator Encounter Type Follow-up Appt  Telephone -  Treatment Initiated Date -  Patient Visit Type Follow-up;MedOnc  Treatment Phase Active Tx  Barriers/Navigation Needs Anxiety;Family Concerns;Morbidities/Frailty;Pain  Education -  Interventions Education;Psycho-Social Support  Acuity Level 2-Minimal Needs (1-2 Barriers Identified)  Referrals -  Coordination of Care -  Education Method Verbal  Support Groups/Services Friends and Family  Time Spent with Patient 30

## 2020-08-01 ENCOUNTER — Ambulatory Visit (HOSPITAL_COMMUNITY): Payer: Medicare HMO

## 2020-08-01 LAB — CHROMOGRANIN A: Chromogranin A (ng/mL): 1824 ng/mL — ABNORMAL HIGH (ref 0.0–101.8)

## 2020-08-02 LAB — BODY FLUID CULTURE: Culture: NO GROWTH

## 2020-08-02 LAB — CYTOLOGY - NON PAP

## 2020-08-03 ENCOUNTER — Inpatient Hospital Stay: Payer: Medicare HMO

## 2020-08-03 ENCOUNTER — Inpatient Hospital Stay: Payer: Medicare HMO | Admitting: Hematology & Oncology

## 2020-08-09 DIAGNOSIS — K766 Portal hypertension: Secondary | ICD-10-CM | POA: Diagnosis not present

## 2020-08-09 DIAGNOSIS — R188 Other ascites: Secondary | ICD-10-CM | POA: Diagnosis not present

## 2020-08-11 ENCOUNTER — Other Ambulatory Visit: Payer: Self-pay

## 2020-08-11 ENCOUNTER — Inpatient Hospital Stay: Payer: Medicare HMO | Attending: Hematology & Oncology

## 2020-08-11 ENCOUNTER — Inpatient Hospital Stay (HOSPITAL_BASED_OUTPATIENT_CLINIC_OR_DEPARTMENT_OTHER): Payer: Medicare HMO | Admitting: Hematology & Oncology

## 2020-08-11 ENCOUNTER — Inpatient Hospital Stay: Payer: Medicare HMO

## 2020-08-11 ENCOUNTER — Encounter: Payer: Self-pay | Admitting: Hematology & Oncology

## 2020-08-11 VITALS — BP 98/60 | HR 85 | Temp 98.0°F | Resp 16 | Wt 131.0 lb

## 2020-08-11 DIAGNOSIS — C7A098 Malignant carcinoid tumors of other sites: Secondary | ICD-10-CM | POA: Diagnosis not present

## 2020-08-11 DIAGNOSIS — E340 Carcinoid syndrome: Secondary | ICD-10-CM | POA: Insufficient documentation

## 2020-08-11 DIAGNOSIS — K766 Portal hypertension: Secondary | ICD-10-CM | POA: Insufficient documentation

## 2020-08-11 DIAGNOSIS — C7B02 Secondary carcinoid tumors of liver: Secondary | ICD-10-CM | POA: Diagnosis not present

## 2020-08-11 DIAGNOSIS — R188 Other ascites: Secondary | ICD-10-CM | POA: Diagnosis not present

## 2020-08-11 DIAGNOSIS — D3A098 Benign carcinoid tumors of other sites: Secondary | ICD-10-CM | POA: Diagnosis not present

## 2020-08-11 DIAGNOSIS — I81 Portal vein thrombosis: Secondary | ICD-10-CM | POA: Insufficient documentation

## 2020-08-11 LAB — PREALBUMIN: Prealbumin: 9.9 mg/dL — ABNORMAL LOW (ref 18–38)

## 2020-08-11 LAB — CBC WITH DIFFERENTIAL (CANCER CENTER ONLY)
Abs Immature Granulocytes: 0.08 10*3/uL — ABNORMAL HIGH (ref 0.00–0.07)
Basophils Absolute: 0.1 10*3/uL (ref 0.0–0.1)
Basophils Relative: 1 %
Eosinophils Absolute: 0.3 10*3/uL (ref 0.0–0.5)
Eosinophils Relative: 2 %
HCT: 42.3 % (ref 39.0–52.0)
Hemoglobin: 14.3 g/dL (ref 13.0–17.0)
Immature Granulocytes: 1 %
Lymphocytes Relative: 18 %
Lymphs Abs: 2.4 10*3/uL (ref 0.7–4.0)
MCH: 34.7 pg — ABNORMAL HIGH (ref 26.0–34.0)
MCHC: 33.8 g/dL (ref 30.0–36.0)
MCV: 102.7 fL — ABNORMAL HIGH (ref 80.0–100.0)
Monocytes Absolute: 1.4 10*3/uL — ABNORMAL HIGH (ref 0.1–1.0)
Monocytes Relative: 10 %
Neutro Abs: 9.2 10*3/uL — ABNORMAL HIGH (ref 1.7–7.7)
Neutrophils Relative %: 68 %
Platelet Count: 300 10*3/uL (ref 150–400)
RBC: 4.12 MIL/uL — ABNORMAL LOW (ref 4.22–5.81)
RDW: 14.4 % (ref 11.5–15.5)
WBC Count: 13.5 10*3/uL — ABNORMAL HIGH (ref 4.0–10.5)
nRBC: 0 % (ref 0.0–0.2)

## 2020-08-11 MED ORDER — ALBUMIN HUMAN 25 % IV SOLN: 50.0000 g | Freq: Every day | INTRAVENOUS | Status: DC

## 2020-08-11 MED ORDER — LANREOTIDE ACETATE 120 MG/0.5ML ~~LOC~~ SOLN
120.0000 mg | Freq: Once | SUBCUTANEOUS | Status: AC
  Administered 2020-08-11: 120 mg via SUBCUTANEOUS

## 2020-08-11 MED ORDER — LANREOTIDE ACETATE 120 MG/0.5ML ~~LOC~~ SOLN
SUBCUTANEOUS | Status: AC
  Filled 2020-08-11: qty 120

## 2020-08-11 MED ORDER — ALBUMIN HUMAN 25 % IV SOLN: 50.0000 g | Freq: Once | INTRAVENOUS | Status: DC

## 2020-08-11 NOTE — Patient Instructions (Signed)
Lanreotide injection What is this medicine? LANREOTIDE (lan REE oh tide) is used to reduce blood levels of growth hormone in patients with a condition called acromegaly. It also works to slow or stop tumor growth in patients with neuroendocrine tumors and treat carcinoid syndrome. This medicine may be used for other purposes; ask your health care provider or pharmacist if you have questions. COMMON BRAND NAME(S): Somatuline [place] What should I tell my health care provider before I take this medicine? They need to know if you have any of these conditions:  diabetes  gallbladder disease  heart disease  kidney disease  liver disease  thyroid disease  an unusual or allergic reaction to lanreotide, other medicines, foods, dyes, or preservatives  pregnant or trying to get pregnant  breast-feeding How should I use this medicine? This medicine is for injection under the skin. It is given by a health care professional in a hospital or clinic setting. Contact your pediatrician or health care professional regarding the use of this medicine in children. Special care may be needed. Overdosage: If you think you have taken too much of this medicine contact a poison control center or emergency room at once. NOTE: This medicine is only for you. Do not share this medicine with others. What if I miss a dose? It is important not to miss your dose. Call your doctor or health care professional if you are unable to keep an appointment. What may interact with this medicine? This medicine may interact with the following medications:  bromocriptine  cyclosporine  certain medicines for blood pressure, heart disease, irregular heart beat  certain medicines for diabetes  quinidine  terfenadine This list may not describe all possible interactions. Give your health care provider a list of all the medicines, herbs, non-prescription drugs, or dietary supplements you use. Also tell them if you smoke,  drink alcohol, or use illegal drugs. Some items may interact with your medicine. What should I watch for while using this medicine? Tell your doctor or healthcare professional if your symptoms do not start to get better or if they get worse. Visit your doctor or health care professional for regular checks on your progress. Your condition will be monitored carefully while you are receiving this medicine. This medicine may increase blood sugar. Ask your healthcare provider if changes in diet or medicines are needed if you have diabetes. You may need blood work done while you are taking this medicine. Women should inform their doctor if they wish to become pregnant or think they might be pregnant. There is a potential for serious side effects to an unborn child. Talk to your health care professional or pharmacist for more information. Do not breast-feed an infant while taking this medicine or for 6 months after stopping it. This medicine has caused ovarian failure in some women. This medicine may interfere with the ability to have a child. Talk with your doctor or health care professional if you are concerned about your fertility. What side effects may I notice from receiving this medicine? Side effects that you should report to your doctor or health care professional as soon as possible:  allergic reactions like skin rash, itching or hives, swelling of the face, lips, or tongue  increased blood pressure  severe stomach pain  signs and symptoms of hgh blood sugar such as being more thirsty or hungry or having to urinate more than normal. You may also feel very tired or have blurry vision.  signs and symptoms of low blood   sugar such as feeling anxious; confusion; dizziness; increased hunger; unusually weak or tired; sweating; shakiness; cold; irritable; headache; blurred vision; fast heartbeat; loss of consciousness  unusually slow heartbeat Side effects that usually do not require medical  attention (report to your doctor or health care professional if they continue or are bothersome):  constipation  diarrhea  dizziness  headache  muscle pain  muscle spasms  nausea  pain, redness, or irritation at site where injected This list may not describe all possible side effects. Call your doctor for medical advice about side effects. You may report side effects to FDA at 1-800-FDA-1088. Where should I keep my medicine? This drug is given in a hospital or clinic and will not be stored at home. NOTE: This sheet is a summary. It may not cover all possible information. If you have questions about this medicine, talk to your doctor, pharmacist, or health care provider.  2020 Elsevier/Gold Standard (2018-09-04 09:13:08)  

## 2020-08-11 NOTE — Progress Notes (Signed)
Hematology and Oncology Follow Up Visit  Bruce Gray [id number] 1941/06/19 79 y.o. 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  Unfortunately, the ascites keeps recurring.  He keeps getting paracenteses.  Last time he was here, he had a paracentesis the next day.  I think 10 L of fluid was taken off.  Again, there was no malignant cells in the fluid.  So far, we have never found any malignant cells in the ascitic fluid.  He is seen hepatology at [facility].  I think he just saw her.  I do still think there is anything that she can do to help him.  I want to see about the [place] for his carcinoid.  His last chromogranin A was 1800.  He does have another paracentesis.  I would like to try to get this before we go into the Labor Day weekend.  I am going to try to see about doing albumin in the office next week.  Maybe, we can try to load him up with albumin and then try to get some of the fluid off.  This is still a very difficult situation.  I think he has 2 separate problems.  I do not see any evidence of carcinoid heart.  I will see any evidence of right-sided heart failure.  He has no swelling in his legs.  He is trying to eat.  When the ascites come back, it is difficult for him to eat.  Overall, I would say his performance status is ECOG 2.    Medications:  Current Outpatient Medications:  .  docusate sodium (COLACE) 100 MG capsule, Take 100 mg by mouth daily as needed for mild constipation. , Disp: , Rfl:  .  nystatin (MYCOSTATIN) 100000 UNIT/ML suspension, Take 5 mLs (500,000 Units total) by mouth 4 (four) times daily. Swish in mouth for 30 seconds, then spit out!! (Patient not taking: Reported on 07/29/2020), Disp: 120 mL,  Rfl: 2 .  omeprazole (PRILOSEC) 20 MG capsule, Take 1 capsule (20 mg total) by mouth in the morning and at bedtime., Disp: 180 capsule, Rfl: 3  Current Facility-Administered Medications:  .  [START ON 08/12/2020] albumin human 25 % solution 50 g, 50 g, Intravenous, Once, Bruce Gray, Bruce Cobb, MD .  Derrill Memo ON 08/16/2020] albumin human 25 % solution 50 g, 50 g, Intravenous, Daily, Bruce Gray, Bruce Cobb, MD  Allergies:  Allergies  Allergen Reactions  . Codeine Nausea And Vomiting  . Lansoprazole Diarrhea and Nausea And Vomiting    Past Medical History, Surgical history, Social history, and Family History were reviewed and updated.  Review of Systems: Review of Systems  Constitutional: Negative.   HENT:  Negative.   Eyes: Negative.   Respiratory: Negative.   Cardiovascular: Negative.   Gastrointestinal: Positive for diarrhea.  Endocrine: Negative.   Genitourinary: Negative.    Musculoskeletal: Negative.   Skin: Negative.   Neurological: Negative.   Hematological: Negative.   Psychiatric/Behavioral: Negative.     Physical Exam:  weight is 131 lb (59.4 kg). His oral temperature is 98 F (36.7 C). His blood pressure is 98/60 and his pulse is 85. His respiration is 16 and oxygen saturation is 100%.   Wt Readings from Last 3 Encounters:  08/11/20 131 lb (59.4 kg)  07/29/20 132 lb 1.3 oz (59.9 kg)  07/05/20 122 lb 8 oz (55.6 kg)    Physical Exam Vitals reviewed.  HENT:     Head: Normocephalic and atraumatic.  Eyes:     Pupils: Pupils are equal, round, and reactive to light.  Cardiovascular:     Rate and Rhythm: Normal rate and regular rhythm.     Heart sounds: Normal heart sounds.  Pulmonary:     Effort: Pulmonary effort is normal.     Breath sounds: Normal breath sounds.  Abdominal:     General: Bowel sounds are normal.     Palpations: Abdomen is soft.     Comments: His abdomen is somewhat distended.  There is a fluid wave.  There is no guarding or rebound tenderness.  There is no  obvious hepatosplenomegaly.  Musculoskeletal:        General: No tenderness or deformity. Normal range of motion.     Cervical back: Normal range of motion.  Lymphadenopathy:     Cervical: No cervical adenopathy.  Skin:    General: Skin is warm and dry.     Findings: No erythema or rash.  Neurological:     Mental Status: He is alert and oriented to person, place, and time.  Psychiatric:        Behavior: Behavior normal.        Thought Content: Thought content normal.        Judgment: Judgment normal.    Lab Results  Component Value Date   WBC 13.5 (H) 08/11/2020   HGB 14.3 08/11/2020   HCT 42.3 08/11/2020   MCV 102.7 (H) 08/11/2020   PLT 300 08/11/2020     Chemistry      Component Value Date/Time   NA 137 07/29/2020 0849   NA 139 02/03/2018 0843   K 4.9 07/29/2020 0849   CL 101 07/29/2020 0849   CO2 28 07/29/2020 0849   BUN 30 (H) 07/29/2020 0849   BUN 22 02/03/2018 0843   CREATININE 1.66 (H) 07/29/2020 0849      Component Value Date/Time   CALCIUM 9.6 07/29/2020 0849   ALKPHOS 237 (H) 07/29/2020 0849   AST 32 07/29/2020 0849   ALT 13 07/29/2020 0849   BILITOT 1.0 07/29/2020 0849       Impression and Plan: Bruce Gray is a very nice 79 year old white male.  He has metastatic carcinoid.   I just wish that his recurrent ascites would resolve.  I just do not know how this is happening.  Again there is no malignant cells in the ascites so I cannot imagine this being from the carcinoid.  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  We will try to get the fluid taken out tomorrow.  He will get his Somatuline today.  I know he is trying his best.  His wife is doing a great job with him.  We will have him come back in 2 weeks.  We will see if radiology might consider him for Lutathera injections.  I do not know if this will help the ascites.  I do think it will help the carcinoid.       Volanda Napoleon, MD 9/2/20215:01 PM

## 2020-08-12 ENCOUNTER — Telehealth: Payer: Self-pay | Admitting: *Deleted

## 2020-08-12 ENCOUNTER — Encounter: Payer: Self-pay | Admitting: *Deleted

## 2020-08-12 ENCOUNTER — Other Ambulatory Visit: Payer: Self-pay | Admitting: *Deleted

## 2020-08-12 DIAGNOSIS — E8809 Other disorders of plasma-protein metabolism, not elsewhere classified: Secondary | ICD-10-CM | POA: Insufficient documentation

## 2020-08-12 LAB — CMP (CANCER CENTER ONLY)
ALT: 17 U/L (ref 0–44)
AST: 37 U/L (ref 15–41)
Albumin: 3.4 g/dL — ABNORMAL LOW (ref 3.5–5.0)
Alkaline Phosphatase: 263 U/L — ABNORMAL HIGH (ref 38–126)
Anion gap: 9 (ref 5–15)
BUN: 32 mg/dL — ABNORMAL HIGH (ref 8–23)
CO2: 26 mmol/L (ref 22–32)
Calcium: 9.7 mg/dL (ref 8.9–10.3)
Chloride: 103 mmol/L (ref 98–111)
Creatinine: 1.53 mg/dL — ABNORMAL HIGH (ref 0.61–1.24)
GFR, Est AFR Am: 49 mL/min — ABNORMAL LOW (ref >60)
GFR, Estimated: 42 mL/min — ABNORMAL LOW (ref >60)
Glucose, Bld: 117 mg/dL — ABNORMAL HIGH (ref 70–99)
Potassium: 4.5 mmol/L (ref 3.5–5.1)
Sodium: 138 mmol/L (ref 135–145)
Total Bilirubin: 1.2 mg/dL (ref 0.3–1.2)
Total Protein: 6.4 g/dL — ABNORMAL LOW (ref 6.5–8.1)

## 2020-08-12 LAB — LACTATE DEHYDROGENASE: LDH: 161 U/L (ref 98–192)

## 2020-08-12 LAB — CHROMOGRANIN A: Chromogranin A (ng/mL): 1987 ng/mL — ABNORMAL HIGH (ref 0.0–101.8)

## 2020-08-12 MED ORDER — LANREOTIDE ACETATE 120 MG/0.5ML ~~LOC~~ SOLN: 120.0000 mg | SUBCUTANEOUS | 4 refills | Status: DC

## 2020-08-12 NOTE — Progress Notes (Signed)
Patient seen in the office yesterday. He continues to have issues with ascites. He requires albumin infusion followed by paracentesis. This has already been scheduled for 08/16/2020. No further needs at this time.  Oncology Nurse Navigator Documentation  Oncology Nurse Navigator Flowsheets 08/12/2020  Abnormal Finding Date -  Confirmed Diagnosis Date -  Diagnosis Status -  Planned Course of Treatment -  Phase of Treatment -  Hormonal Therapy Pending- Reason: -  Hormonal Actual Start Date: -  Navigator Follow Up Date: 08/16/2020  Navigator Follow Up Reason: Symptom Management  Navigator Location [facility]  Referral Date to RadOnc/MedOnc -  Navigator Encounter Type Appt/Treatment Plan Review  Telephone -  Treatment Initiated Date -  Patient Visit Type Follow-up;MedOnc  Treatment Phase Active Tx  Barriers/Navigation Needs Anxiety;Family Concerns;Morbidities/Frailty;Pain  Education -  Interventions None Required  Acuity Level 2-Minimal Needs (1-2 Barriers Identified)  Referrals -  Coordination of Care -  Education Method -  Support Groups/Services Friends and Family  Time Spent with Patient 43

## 2020-08-12 NOTE — Telephone Encounter (Signed)
Called patient wife Ivin Booty to review all appointments next week.  LMAM for patient to call back.  Dr Marin Olp states patient does not need to come for Albumin on Tuesday morning but to focus on paracentesis instead.

## 2020-08-16 ENCOUNTER — Ambulatory Visit: Payer: Medicare HMO

## 2020-08-16 ENCOUNTER — Other Ambulatory Visit: Payer: Self-pay

## 2020-08-16 ENCOUNTER — Encounter: Payer: Self-pay | Admitting: *Deleted

## 2020-08-16 ENCOUNTER — Ambulatory Visit (HOSPITAL_COMMUNITY)
Admission: RE | Admit: 2020-08-16 | Discharge: 2020-08-16 | Disposition: A | Discharge status: Home / Self Care | Payer: Medicare HMO | Source: Ambulatory Visit | Attending: Hematology & Oncology | Admitting: Hematology & Oncology

## 2020-08-16 DIAGNOSIS — C7A098 Malignant carcinoid tumors of other sites: Secondary | ICD-10-CM | POA: Diagnosis not present

## 2020-08-16 DIAGNOSIS — R180 Malignant ascites: Secondary | ICD-10-CM | POA: Insufficient documentation

## 2020-08-16 DIAGNOSIS — R188 Other ascites: Secondary | ICD-10-CM | POA: Diagnosis not present

## 2020-08-16 DIAGNOSIS — D3A098 Benign carcinoid tumors of other sites: Secondary | ICD-10-CM

## 2020-08-16 HISTORY — PX: IR PARACENTESIS: IMG2679

## 2020-08-16 LAB — BODY FLUID CELL COUNT WITH DIFFERENTIAL
Eos, Fluid: 1 %
Lymphs, Fluid: 4 %
Monocyte-Macrophage-Serous Fluid: 84 % (ref 50–90)
Neutrophil Count, Fluid: 11 % (ref 0–25)
Total Nucleated Cell Count, Fluid: 136 cu mm (ref 0–1000)

## 2020-08-16 MED ORDER — ALBUMIN HUMAN 25 % IV SOLN: 50.0000 g | Freq: Once | INTRAVENOUS | Status: AC

## 2020-08-16 MED ORDER — LIDOCAINE HCL 1 % IJ SOLN
INTRAMUSCULAR | Status: AC | PRN
  Administered 2020-08-16: 10 mL

## 2020-08-16 MED ORDER — ALBUMIN HUMAN 25 % IV SOLN
INTRAVENOUS | Status: AC
  Administered 2020-08-16: 50 g via INTRAVENOUS
  Filled 2020-08-16: qty 200

## 2020-08-16 MED ORDER — LIDOCAINE HCL 1 % IJ SOLN
INTRAMUSCULAR | Status: AC
  Filled 2020-08-16: qty 20

## 2020-08-16 NOTE — Progress Notes (Signed)
Oncology Nurse Navigator Documentation  Oncology Nurse Navigator Flowsheets 08/16/2020  Abnormal Finding Date -  Confirmed Diagnosis Date -  Diagnosis Status -  Planned Course of Treatment -  Phase of Treatment -  Hormonal Therapy Pending- Reason: -  Hormonal Actual Start Date: -  Navigator Follow Up Date: 08/17/2020  Navigator Follow Up Reason: Symptom Management  Navigator Location [facility]  Referral Date to RadOnc/MedOnc -  Navigator Encounter Type Scan Review;Appt/Treatment Plan Review  Telephone -  Treatment Initiated Date -  Patient Visit Type Other  Treatment Phase Active Tx  Barriers/Navigation Needs Anxiety;Family Concerns;Morbidities/Frailty;Pain  Education -  Interventions None Required  Acuity Level 2-Minimal Needs (1-2 Barriers Identified)  Referrals -  Coordination of Care -  Education Method -  Support Groups/Services Friends and Family  Time Spent with Patient 15

## 2020-08-16 NOTE — Procedures (Signed)
PROCEDURE SUMMARY:  Successful image-guided paracentesis from the right lateral abdomen.  Yielded 6.5 liters of clear gold fluid.  No immediate complications.  EBL = 0 mL. Patient tolerated well.   Specimen was sent for labs.  Please see imaging section of Epic for full dictation.   Claris Pong Shantice Menger PA-C 08/16/2020 2:58 PM

## 2020-08-17 ENCOUNTER — Encounter: Payer: Self-pay | Admitting: *Deleted

## 2020-08-17 ENCOUNTER — Other Ambulatory Visit: Payer: Self-pay | Admitting: Hematology & Oncology

## 2020-08-17 ENCOUNTER — Other Ambulatory Visit: Payer: Self-pay | Admitting: *Deleted

## 2020-08-17 ENCOUNTER — Inpatient Hospital Stay: Payer: Medicare HMO

## 2020-08-17 VITALS — BP 111/65 | HR 68 | Temp 97.6°F | Resp 18

## 2020-08-17 DIAGNOSIS — K766 Portal hypertension: Secondary | ICD-10-CM | POA: Diagnosis not present

## 2020-08-17 DIAGNOSIS — E8809 Other disorders of plasma-protein metabolism, not elsewhere classified: Secondary | ICD-10-CM

## 2020-08-17 DIAGNOSIS — I81 Portal vein thrombosis: Secondary | ICD-10-CM | POA: Diagnosis not present

## 2020-08-17 DIAGNOSIS — R188 Other ascites: Secondary | ICD-10-CM | POA: Diagnosis not present

## 2020-08-17 DIAGNOSIS — C7B02 Secondary carcinoid tumors of liver: Secondary | ICD-10-CM | POA: Diagnosis not present

## 2020-08-17 DIAGNOSIS — E340 Carcinoid syndrome: Secondary | ICD-10-CM | POA: Diagnosis not present

## 2020-08-17 DIAGNOSIS — C7A098 Malignant carcinoid tumors of other sites: Secondary | ICD-10-CM | POA: Diagnosis not present

## 2020-08-17 DIAGNOSIS — D3A098 Benign carcinoid tumors of other sites: Secondary | ICD-10-CM

## 2020-08-17 LAB — CYTOLOGY - NON PAP

## 2020-08-17 MED ORDER — ALBUMIN HUMAN 25 % IV SOLN
50.0000 g | Freq: Once | INTRAVENOUS | Status: AC
  Administered 2020-08-17: 50 g via INTRAVENOUS
  Filled 2020-08-17: qty 100

## 2020-08-17 MED ORDER — TRAMADOL HCL 50 MG PO TABS: 50.0000 mg | ORAL_TABLET | Freq: Four times a day (QID) | ORAL | 1 refills | Status: DC | PRN

## 2020-08-17 NOTE — Progress Notes (Signed)
Patient is here for albumin infusion. He has his sixth paracentesis yesterday and states this was his most difficult one yet. He had a lot of abdominal pain and cramping yesterday and through the night. This morning he feels slightly better. He is scheduled for albumin infusion the rest of this week.  Oncology Nurse Navigator Documentation  Oncology Nurse Navigator Flowsheets 08/17/2020  Abnormal Finding Date -  Confirmed Diagnosis Date -  Diagnosis Status -  Planned Course of Treatment -  Phase of Treatment -  Hormonal Therapy Pending- Reason: -  Hormonal Actual Start Date: -  Navigator Follow Up Date: 08/25/2020  Navigator Follow Up Reason: Follow-up Appointment  Navigator Location [facility]  Referral Date to RadOnc/MedOnc -  Navigator Encounter Type Treatment  Telephone -  Treatment Initiated Date -  Patient Visit Type MedOnc  Treatment Phase Active Tx  Barriers/Navigation Needs Anxiety;Family Concerns;Morbidities/Frailty;Pain  Education -  Interventions Psycho-Social Support  Acuity Level 2-Minimal Needs (1-2 Barriers Identified)  Referrals -  Coordination of Care -  Education Method -  Support Groups/Services Friends and Family  Time Spent with Patient 31

## 2020-08-17 NOTE — Patient Instructions (Signed)
Albumin injection What is this medicine? ALBUMIN (al BYOO min) is used to treat or prevent shock following serious injury, bleeding, surgery, or burns by increasing the volume of blood plasma. This medicine can also replace low blood protein. This medicine may be used for other purposes; ask your health care provider or pharmacist if you have questions. COMMON BRAND NAME(S): Albuked, Albumarc, Albuminar, Albuminex, AlbuRx, Albutein, Buminate, Flexbumin, Kedbumin, Macrotec, Plasbumin What should I tell my health care provider before I take this medicine? They need to know if you have any of the following conditions:  anemia  heart disease  kidney disease  an unusual or allergic reaction to albumin, other medicines, foods, dyes, or preservatives  pregnant or trying to get pregnant  breast-feeding How should I use this medicine? This medicine is for infusion into a vein. It is given by a health-care professional in a hospital or clinic. Talk to your pediatrician regarding the use of this medicine in children. While this drug may be prescribed for selected conditions, precautions do apply. Overdosage: If you think you have taken too much of this medicine contact a poison control center or emergency room at once. NOTE: This medicine is only for you. Do not share this medicine with others. What if I miss a dose? This does not apply. What may interact with this medicine? Interactions are not expected. This list may not describe all possible interactions. Give your health care provider a list of all the medicines, herbs, non-prescription drugs, or dietary supplements you use. Also tell them if you smoke, drink alcohol, or use illegal drugs. Some items may interact with your medicine. What should I watch for while using this medicine? Your condition will be closely monitored while you receive this medicine. Some products are derived from human plasma, and there is a small risk that these  products may contain certain types of virus or bacteria. All products are processed to kill most viruses and bacteria. If you have questions concerning the risk of infections, discuss them with your doctor or health care professional. What side effects may I notice from receiving this medicine? Side effects that you should report to your doctor or health care professional as soon as possible:  allergic reactions like skin rash, itching or hives, swelling of the face, lips, or tongue  breathing problems  changes in heartbeat  fever, chills  pain, redness or swelling at the injection site  signs of viral infection including fever, drowsiness, chills, runny nose followed in about 2 weeks by a rash and joint pain  tightness in the chest Side effects that usually do not require medical attention (report to your doctor or health care professional if they continue or are bothersome):  increased salivation  nausea, vomiting This list may not describe all possible side effects. Call your doctor for medical advice about side effects. You may report side effects to FDA at 1-800-FDA-1088. Where should I keep my medicine? This does not apply. You will not be given this medicine to store at home. NOTE: This sheet is a summary. It may not cover all possible information. If you have questions about this medicine, talk to your doctor, pharmacist, or health care provider.  2020 Elsevier/Gold Standard (2008-02-19 10:18:55)

## 2020-08-18 ENCOUNTER — Other Ambulatory Visit: Payer: Self-pay

## 2020-08-18 ENCOUNTER — Inpatient Hospital Stay: Payer: Medicare HMO

## 2020-08-18 VITALS — BP 119/51 | HR 100 | Temp 97.6°F | Resp 18

## 2020-08-18 DIAGNOSIS — C7B02 Secondary carcinoid tumors of liver: Secondary | ICD-10-CM | POA: Diagnosis not present

## 2020-08-18 DIAGNOSIS — K766 Portal hypertension: Secondary | ICD-10-CM | POA: Diagnosis not present

## 2020-08-18 DIAGNOSIS — I2510 Atherosclerotic heart disease of native coronary artery without angina pectoris: Secondary | ICD-10-CM

## 2020-08-18 DIAGNOSIS — E8809 Other disorders of plasma-protein metabolism, not elsewhere classified: Secondary | ICD-10-CM

## 2020-08-18 DIAGNOSIS — E340 Carcinoid syndrome: Secondary | ICD-10-CM | POA: Diagnosis not present

## 2020-08-18 DIAGNOSIS — I81 Portal vein thrombosis: Secondary | ICD-10-CM | POA: Diagnosis not present

## 2020-08-18 DIAGNOSIS — D3A098 Benign carcinoid tumors of other sites: Secondary | ICD-10-CM

## 2020-08-18 DIAGNOSIS — R188 Other ascites: Secondary | ICD-10-CM | POA: Diagnosis not present

## 2020-08-18 DIAGNOSIS — C7A098 Malignant carcinoid tumors of other sites: Secondary | ICD-10-CM | POA: Diagnosis not present

## 2020-08-18 MED ORDER — ALBUMIN HUMAN 25 % IV SOLN
50.0000 g | Freq: Once | INTRAVENOUS | Status: AC
  Administered 2020-08-18: 50 g via INTRAVENOUS
  Filled 2020-08-18: qty 200

## 2020-08-18 MED ORDER — SODIUM CHLORIDE 0.9 % IV SOLN
Freq: Once | INTRAVENOUS | Status: AC
  Filled 2020-08-18: qty 250

## 2020-08-19 ENCOUNTER — Other Ambulatory Visit: Payer: Self-pay | Admitting: Pharmacist

## 2020-08-19 ENCOUNTER — Inpatient Hospital Stay: Payer: Medicare HMO

## 2020-08-19 VITALS — BP 130/63 | HR 67 | Temp 98.0°F | Resp 20

## 2020-08-19 DIAGNOSIS — E340 Carcinoid syndrome: Secondary | ICD-10-CM | POA: Diagnosis not present

## 2020-08-19 DIAGNOSIS — C7B02 Secondary carcinoid tumors of liver: Secondary | ICD-10-CM | POA: Diagnosis not present

## 2020-08-19 DIAGNOSIS — C7A098 Malignant carcinoid tumors of other sites: Secondary | ICD-10-CM | POA: Diagnosis not present

## 2020-08-19 DIAGNOSIS — K766 Portal hypertension: Secondary | ICD-10-CM | POA: Diagnosis not present

## 2020-08-19 DIAGNOSIS — D3A098 Benign carcinoid tumors of other sites: Secondary | ICD-10-CM

## 2020-08-19 DIAGNOSIS — I81 Portal vein thrombosis: Secondary | ICD-10-CM | POA: Diagnosis not present

## 2020-08-19 DIAGNOSIS — E8809 Other disorders of plasma-protein metabolism, not elsewhere classified: Secondary | ICD-10-CM

## 2020-08-19 DIAGNOSIS — R188 Other ascites: Secondary | ICD-10-CM | POA: Diagnosis not present

## 2020-08-19 MED ORDER — SODIUM CHLORIDE 0.9 % IV SOLN
INTRAVENOUS | Status: DC
  Filled 2020-08-19: qty 250

## 2020-08-19 MED ORDER — ALBUMIN HUMAN 25 % IV SOLN
50.0000 g | Freq: Once | INTRAVENOUS | Status: AC
  Administered 2020-08-19: 12.5 g via INTRAVENOUS
  Filled 2020-08-19: qty 200

## 2020-08-19 NOTE — Patient Instructions (Signed)
Albumin injection What is this medicine? ALBUMIN (al BYOO min) is used to treat or prevent shock following serious injury, bleeding, surgery, or burns by increasing the volume of blood plasma. This medicine can also replace low blood protein. This medicine may be used for other purposes; ask your health care provider or pharmacist if you have questions. COMMON BRAND NAME(S): Albuked, Albumarc, Albuminar, Albuminex, AlbuRx, Albutein, Buminate, Flexbumin, Kedbumin, Macrotec, Plasbumin What should I tell my health care provider before I take this medicine? They need to know if you have any of the following conditions:  anemia  heart disease  kidney disease  an unusual or allergic reaction to albumin, other medicines, foods, dyes, or preservatives  pregnant or trying to get pregnant  breast-feeding How should I use this medicine? This medicine is for infusion into a vein. It is given by a health-care professional in a hospital or clinic. Talk to your pediatrician regarding the use of this medicine in children. While this drug may be prescribed for selected conditions, precautions do apply. Overdosage: If you think you have taken too much of this medicine contact a poison control center or emergency room at once. NOTE: This medicine is only for you. Do not share this medicine with others. What if I miss a dose? This does not apply. What may interact with this medicine? Interactions are not expected. This list may not describe all possible interactions. Give your health care provider a list of all the medicines, herbs, non-prescription drugs, or dietary supplements you use. Also tell them if you smoke, drink alcohol, or use illegal drugs. Some items may interact with your medicine. What should I watch for while using this medicine? Your condition will be closely monitored while you receive this medicine. Some products are derived from human plasma, and there is a small risk that these  products may contain certain types of virus or bacteria. All products are processed to kill most viruses and bacteria. If you have questions concerning the risk of infections, discuss them with your doctor or health care professional. What side effects may I notice from receiving this medicine? Side effects that you should report to your doctor or health care professional as soon as possible:  allergic reactions like skin rash, itching or hives, swelling of the face, lips, or tongue  breathing problems  changes in heartbeat  fever, chills  pain, redness or swelling at the injection site  signs of viral infection including fever, drowsiness, chills, runny nose followed in about 2 weeks by a rash and joint pain  tightness in the chest Side effects that usually do not require medical attention (report to your doctor or health care professional if they continue or are bothersome):  increased salivation  nausea, vomiting This list may not describe all possible side effects. Call your doctor for medical advice about side effects. You may report side effects to FDA at 1-800-FDA-1088. Where should I keep my medicine? This does not apply. You will not be given this medicine to store at home. NOTE: This sheet is a summary. It may not cover all possible information. If you have questions about this medicine, talk to your doctor, pharmacist, or health care provider.  2020 Elsevier/Gold Standard (2008-02-19 10:18:55)

## 2020-08-19 NOTE — Progress Notes (Signed)
Albumin doses given as follows:  08/17/20   Albumin 50g 08/18/20   Albumin 25g 08/19/20 Albumin 50 g.  MD has approved above doses.

## 2020-08-21 LAB — CULTURE, BODY FLUID W GRAM STAIN -BOTTLE: Culture: NO GROWTH

## 2020-08-24 ENCOUNTER — Other Ambulatory Visit: Payer: Self-pay | Admitting: *Deleted

## 2020-08-24 DIAGNOSIS — D3A098 Benign carcinoid tumors of other sites: Secondary | ICD-10-CM

## 2020-08-24 DIAGNOSIS — E8809 Other disorders of plasma-protein metabolism, not elsewhere classified: Secondary | ICD-10-CM

## 2020-08-24 DIAGNOSIS — R188 Other ascites: Secondary | ICD-10-CM

## 2020-08-25 ENCOUNTER — Encounter: Payer: Self-pay | Admitting: *Deleted

## 2020-08-25 ENCOUNTER — Other Ambulatory Visit: Payer: Self-pay

## 2020-08-25 ENCOUNTER — Inpatient Hospital Stay: Payer: Medicare HMO

## 2020-08-25 ENCOUNTER — Inpatient Hospital Stay (HOSPITAL_BASED_OUTPATIENT_CLINIC_OR_DEPARTMENT_OTHER): Payer: Medicare HMO | Admitting: Hematology & Oncology

## 2020-08-25 ENCOUNTER — Encounter: Payer: Self-pay | Admitting: Hematology & Oncology

## 2020-08-25 VITALS — BP 136/62 | HR 80 | Temp 98.0°F | Resp 18 | Ht 65.0 in | Wt 127.0 lb

## 2020-08-25 DIAGNOSIS — C7A098 Malignant carcinoid tumors of other sites: Secondary | ICD-10-CM | POA: Diagnosis not present

## 2020-08-25 DIAGNOSIS — E8809 Other disorders of plasma-protein metabolism, not elsewhere classified: Secondary | ICD-10-CM

## 2020-08-25 DIAGNOSIS — I81 Portal vein thrombosis: Secondary | ICD-10-CM | POA: Diagnosis not present

## 2020-08-25 DIAGNOSIS — D3A098 Benign carcinoid tumors of other sites: Secondary | ICD-10-CM | POA: Diagnosis not present

## 2020-08-25 DIAGNOSIS — R188 Other ascites: Secondary | ICD-10-CM

## 2020-08-25 DIAGNOSIS — C7B02 Secondary carcinoid tumors of liver: Secondary | ICD-10-CM | POA: Diagnosis not present

## 2020-08-25 DIAGNOSIS — K766 Portal hypertension: Secondary | ICD-10-CM | POA: Diagnosis not present

## 2020-08-25 DIAGNOSIS — E340 Carcinoid syndrome: Secondary | ICD-10-CM | POA: Diagnosis not present

## 2020-08-25 LAB — CMP (CANCER CENTER ONLY)
ALT: 11 U/L (ref 0–44)
AST: 27 U/L (ref 15–41)
Albumin: 3.9 g/dL (ref 3.5–5.0)
Alkaline Phosphatase: 173 U/L — ABNORMAL HIGH (ref 38–126)
Anion gap: 8 (ref 5–15)
BUN: 33 mg/dL — ABNORMAL HIGH (ref 8–23)
CO2: 26 mmol/L (ref 22–32)
Calcium: 10 mg/dL (ref 8.9–10.3)
Chloride: 107 mmol/L (ref 98–111)
Creatinine: 1.28 mg/dL — ABNORMAL HIGH (ref 0.61–1.24)
GFR, Est AFR Am: >60 mL/min (ref >60)
GFR, Estimated: 52 mL/min — ABNORMAL LOW (ref >60)
Glucose, Bld: 104 mg/dL — ABNORMAL HIGH (ref 70–99)
Potassium: 4.8 mmol/L (ref 3.5–5.1)
Sodium: 141 mmol/L (ref 135–145)
Total Bilirubin: 1.2 mg/dL (ref 0.3–1.2)
Total Protein: 6 g/dL — ABNORMAL LOW (ref 6.5–8.1)

## 2020-08-25 LAB — CBC WITH DIFFERENTIAL (CANCER CENTER ONLY)
Abs Immature Granulocytes: 0.07 10*3/uL (ref 0.00–0.07)
Basophils Absolute: 0.1 10*3/uL (ref 0.0–0.1)
Basophils Relative: 1 %
Eosinophils Absolute: 0.3 10*3/uL (ref 0.0–0.5)
Eosinophils Relative: 3 %
HCT: 40.3 % (ref 39.0–52.0)
Hemoglobin: 13.6 g/dL (ref 13.0–17.0)
Immature Granulocytes: 1 %
Lymphocytes Relative: 17 %
Lymphs Abs: 2.1 10*3/uL (ref 0.7–4.0)
MCH: 34.3 pg — ABNORMAL HIGH (ref 26.0–34.0)
MCHC: 33.7 g/dL (ref 30.0–36.0)
MCV: 101.5 fL — ABNORMAL HIGH (ref 80.0–100.0)
Monocytes Absolute: 1.5 10*3/uL — ABNORMAL HIGH (ref 0.1–1.0)
Monocytes Relative: 12 %
Neutro Abs: 8.4 10*3/uL — ABNORMAL HIGH (ref 1.7–7.7)
Neutrophils Relative %: 66 %
Platelet Count: 248 10*3/uL (ref 150–400)
RBC: 3.97 MIL/uL — ABNORMAL LOW (ref 4.22–5.81)
RDW: 14.3 % (ref 11.5–15.5)
WBC Count: 12.6 10*3/uL — ABNORMAL HIGH (ref 4.0–10.5)
nRBC: 0 % (ref 0.0–0.2)

## 2020-08-25 MED ORDER — LANREOTIDE ACETATE 120 MG/0.5ML ~~LOC~~ SOLN
120.0000 mg | Freq: Once | SUBCUTANEOUS | Status: AC
  Administered 2020-08-25: 120 mg via SUBCUTANEOUS

## 2020-08-25 NOTE — Progress Notes (Signed)
Hematology and Oncology Follow Up Visit  Bruce Gray [id number] August 09, 1941 79 y.o. 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  Unfortunately, the ascites keeps recurring.  We tried him with albumin.  He got albumin daily for 4 days.  This seemed to help for a little bit but now the fluid is just coming back again.  We will have to try to get him drained again.  Hopefully we can do this tomorrow.  His renal function is getting better.  We will now see if we cannot get Lutathera for him.  His chromogranin A level has really not come down.  I think the last time we checked it the chromogranin A was over 1900.  He has had no problems with bowels or bladder.  He has had no nausea or vomiting.  He has had no fever.  There is no cough or shortness of breath.  Overall, his performance status is ECOG 1.   Medications:  Current Outpatient Medications:  .  docusate sodium (COLACE) 100 MG capsule, Take 100 mg by mouth daily as needed for mild constipation. , Disp: , Rfl:  .  lanreotide acetate (SOMATULINE DEPOT) 120 MG/0.5ML injection, Inject 120 mg into the skin every 14 (fourteen) days. Inject as a deep subcutaneous injection in the superior external quadrant of the buttock. Alternate injection site between the right and left sides., Disp: 0.5 mL, Rfl: 4 .  omeprazole (PRILOSEC) 20 MG capsule, Take 1 capsule (20 mg total) by mouth in the morning and at bedtime., Disp: 180 capsule, Rfl: 3 .  traMADol (ULTRAM) 50 MG tablet, Take 1 tablet (50 mg total) by mouth every 6 (six) hours as needed for moderate pain (every 8 hours)., Disp: 30 tablet, Rfl: 1 .  nystatin (MYCOSTATIN) 100000 UNIT/ML suspension, Take 5 mLs (500,000 Units total) by mouth 4  (four) times daily. Swish in mouth for 30 seconds, then spit out!! (Patient not taking: Reported on 07/29/2020), Disp: 120 mL, Rfl: 2  Allergies:  Allergies  Allergen Reactions  . Codeine Nausea And Vomiting  . Lansoprazole Diarrhea and Nausea And Vomiting    Past Medical History, Surgical history, Social history, and Family History were reviewed and updated.  Review of Systems: Review of Systems  Constitutional: Negative.   HENT:  Negative.   Eyes: Negative.   Respiratory: Negative.   Cardiovascular: Negative.   Gastrointestinal: Positive for diarrhea.  Endocrine: Negative.   Genitourinary: Negative.    Musculoskeletal: Negative.   Skin: Negative.   Neurological: Negative.   Hematological: Negative.   Psychiatric/Behavioral: Negative.     Physical Exam:  height is 5\' 5"  (1.651 m) and weight is 127 lb (57.6 kg). His oral temperature is 98 F (36.7 C). His blood pressure is 136/62 and his pulse is 80. His respiration is 18 and oxygen saturation is 100%.   Wt Readings from Last 3 Encounters:  08/25/20 127 lb (57.6 kg)  08/11/20 131 lb (59.4 kg)  07/29/20 132 lb 1.3 oz (59.9 kg)    Physical Exam Vitals reviewed.  HENT:     Head: Normocephalic and atraumatic.  Eyes:     Pupils: Pupils are equal, round, and reactive to light.  Cardiovascular:  Rate and Rhythm: Normal rate and regular rhythm.     Heart sounds: Normal heart sounds.  Pulmonary:     Effort: Pulmonary effort is normal.     Breath sounds: Normal breath sounds.  Abdominal:     General: Bowel sounds are normal.     Palpations: Abdomen is soft.     Comments: His abdomen is somewhat distended.  There is a fluid wave.  There is no guarding or rebound tenderness.  There is no obvious hepatosplenomegaly.  Musculoskeletal:        General: No tenderness or deformity. Normal range of motion.     Cervical back: Normal range of motion.  Lymphadenopathy:     Cervical: No cervical adenopathy.  Skin:    General:  Skin is warm and dry.     Findings: No erythema or rash.  Neurological:     Mental Status: He is alert and oriented to person, place, and time.  Psychiatric:        Behavior: Behavior normal.        Thought Content: Thought content normal.        Judgment: Judgment normal.    Lab Results  Component Value Date   WBC 12.6 (H) 08/25/2020   HGB 13.6 08/25/2020   HCT 40.3 08/25/2020   MCV 101.5 (H) 08/25/2020   PLT 248 08/25/2020     Chemistry      Component Value Date/Time   NA 141 08/25/2020 1500   NA 139 02/03/2018 0843   K 4.8 08/25/2020 1500   CL 107 08/25/2020 1500   CO2 26 08/25/2020 1500   BUN 33 (H) 08/25/2020 1500   BUN 22 02/03/2018 0843   CREATININE 1.28 (H) 08/25/2020 1500      Component Value Date/Time   CALCIUM 10.0 08/25/2020 1500   ALKPHOS 173 (H) 08/25/2020 1500   AST 27 08/25/2020 1500   ALT 11 08/25/2020 1500   BILITOT 1.2 08/25/2020 1500       Impression and Plan: Bruce Gray is a very nice 79 year old white male.  He has metastatic carcinoid.   I just wish that his recurrent ascites would resolve.  I just do not know how this is happening.  Again there is no malignant cells in the ascites so I cannot imagine this being from the carcinoid.  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  We will try to get the fluid taken out tomorrow.  He will get his Somatuline today.  I will speak with interventional radiology to see if they will consider him for Lutathera.  I will plan to get him back in another couple weeks.    Volanda Napoleon, MD 9/16/20213:48 PM

## 2020-08-25 NOTE — Patient Instructions (Signed)
Lanreotide injection What is this medicine? LANREOTIDE (lan REE oh tide) is used to reduce blood levels of growth hormone in patients with a condition called acromegaly. It also works to slow or stop tumor growth in patients with neuroendocrine tumors and treat carcinoid syndrome. This medicine may be used for other purposes; ask your health care provider or pharmacist if you have questions. COMMON BRAND NAME(S): Somatuline [place] What should I tell my health care provider before I take this medicine? They need to know if you have any of these conditions:  diabetes  gallbladder disease  heart disease  kidney disease  liver disease  thyroid disease  an unusual or allergic reaction to lanreotide, other medicines, foods, dyes, or preservatives  pregnant or trying to get pregnant  breast-feeding How should I use this medicine? This medicine is for injection under the skin. It is given by a health care professional in a hospital or clinic setting. Contact your pediatrician or health care professional regarding the use of this medicine in children. Special care may be needed. Overdosage: If you think you have taken too much of this medicine contact a poison control center or emergency room at once. NOTE: This medicine is only for you. Do not share this medicine with others. What if I miss a dose? It is important not to miss your dose. Call your doctor or health care professional if you are unable to keep an appointment. What may interact with this medicine? This medicine may interact with the following medications:  bromocriptine  cyclosporine  certain medicines for blood pressure, heart disease, irregular heart beat  certain medicines for diabetes  quinidine  terfenadine This list may not describe all possible interactions. Give your health care provider a list of all the medicines, herbs, non-prescription drugs, or dietary supplements you use. Also tell them if you smoke,  drink alcohol, or use illegal drugs. Some items may interact with your medicine. What should I watch for while using this medicine? Tell your doctor or healthcare professional if your symptoms do not start to get better or if they get worse. Visit your doctor or health care professional for regular checks on your progress. Your condition will be monitored carefully while you are receiving this medicine. This medicine may increase blood sugar. Ask your healthcare provider if changes in diet or medicines are needed if you have diabetes. You may need blood work done while you are taking this medicine. Women should inform their doctor if they wish to become pregnant or think they might be pregnant. There is a potential for serious side effects to an unborn child. Talk to your health care professional or pharmacist for more information. Do not breast-feed an infant while taking this medicine or for 6 months after stopping it. This medicine has caused ovarian failure in some women. This medicine may interfere with the ability to have a child. Talk with your doctor or health care professional if you are concerned about your fertility. What side effects may I notice from receiving this medicine? Side effects that you should report to your doctor or health care professional as soon as possible:  allergic reactions like skin rash, itching or hives, swelling of the face, lips, or tongue  increased blood pressure  severe stomach pain  signs and symptoms of hgh blood sugar such as being more thirsty or hungry or having to urinate more than normal. You may also feel very tired or have blurry vision.  signs and symptoms of low blood   sugar such as feeling anxious; confusion; dizziness; increased hunger; unusually weak or tired; sweating; shakiness; cold; irritable; headache; blurred vision; fast heartbeat; loss of consciousness  unusually slow heartbeat Side effects that usually do not require medical  attention (report to your doctor or health care professional if they continue or are bothersome):  constipation  diarrhea  dizziness  headache  muscle pain  muscle spasms  nausea  pain, redness, or irritation at site where injected This list may not describe all possible side effects. Call your doctor for medical advice about side effects. You may report side effects to FDA at 1-800-FDA-1088. Where should I keep my medicine? This drug is given in a hospital or clinic and will not be stored at home. NOTE: This sheet is a summary. It may not cover all possible information. If you have questions about this medicine, talk to your doctor, pharmacist, or health care provider.  2020 Elsevier/Gold Standard (2018-09-04 09:13:08)  

## 2020-08-26 ENCOUNTER — Telehealth: Payer: Self-pay | Admitting: Hematology & Oncology

## 2020-08-26 ENCOUNTER — Ambulatory Visit (HOSPITAL_COMMUNITY)
Admission: RE | Admit: 2020-08-26 | Discharge: 2020-08-26 | Disposition: A | Discharge status: Home / Self Care | Payer: Medicare HMO | Source: Ambulatory Visit | Attending: Hematology & Oncology | Admitting: Hematology & Oncology

## 2020-08-26 DIAGNOSIS — R188 Other ascites: Secondary | ICD-10-CM | POA: Diagnosis not present

## 2020-08-26 DIAGNOSIS — D3A098 Benign carcinoid tumors of other sites: Secondary | ICD-10-CM | POA: Diagnosis not present

## 2020-08-26 MED ORDER — LIDOCAINE HCL 1 % IJ SOLN
INTRAMUSCULAR | Status: AC
  Filled 2020-08-26: qty 20

## 2020-08-26 NOTE — Telephone Encounter (Signed)
Called and spoke with wife about pararcentesis appointment that has been scheduled for today @ 3:00 he will arrive at [facility] at 2;30 per staff message 9/17

## 2020-08-26 NOTE — Progress Notes (Signed)
Patient here with his wife. He feels okay, but his abdomen is becoming distended again. He will likely need another paracentesis. Will follow up with MD note after his visit.  Oncology Nurse Navigator Documentation  Oncology Nurse Navigator Flowsheets 08/25/2020  Abnormal Finding Date -  Confirmed Diagnosis Date -  Diagnosis Status -  Planned Course of Treatment -  Phase of Treatment -  Hormonal Therapy Pending- Reason: -  Hormonal Actual Start Date: -  Navigator Follow Up Date: 09/08/2020  Navigator Follow Up Reason: Follow-up Appointment;Chemotherapy  Navigator Location [facility]  Referral Date to RadOnc/MedOnc -  Navigator Encounter Type Follow-up Appt  Telephone -  Treatment Initiated Date -  Patient Visit Type MedOnc  Treatment Phase Active Tx  Barriers/Navigation Needs Anxiety;Family Concerns;Morbidities/Frailty;Pain  Education -  Interventions Psycho-Social Support  Acuity Level 2-Minimal Needs (1-2 Barriers Identified)  Referrals -  Coordination of Care -  Education Method -  Support Groups/Services Friends and Family  Time Spent with Patient 15

## 2020-08-26 NOTE — Telephone Encounter (Signed)
I called and spoke with wife about upcoming appointments.  She was ok with date/time.  She also asked about paracentesis that was ordered.  I have sent to staff message to Central scheduling about setting this up for today if possible.  Per 9/16 los

## 2020-08-26 NOTE — Addendum Note (Signed)
Addended by: Burney Gauze R on: 08/26/2020 06:40 AM   Modules accepted: Orders

## 2020-08-26 NOTE — Procedures (Signed)
Ultrasound-guided therapeutic paracentesis performed yielding 5.2 liters of yellow fluid. No immediate complications.EBL none.

## 2020-08-30 ENCOUNTER — Other Ambulatory Visit: Payer: Self-pay | Admitting: Hematology & Oncology

## 2020-08-30 DIAGNOSIS — D3A098 Benign carcinoid tumors of other sites: Secondary | ICD-10-CM

## 2020-09-02 ENCOUNTER — Encounter: Payer: Self-pay | Admitting: *Deleted

## 2020-09-02 NOTE — Progress Notes (Signed)
Nuclear Medicine called and spoke to this nurse regarding PET scan and Somatuline. PET scan has been scheduled for October 14th. They will use copper in the pet scan. No Somatuline before the PET scan. MD made aware.

## 2020-09-05 ENCOUNTER — Other Ambulatory Visit (HOSPITAL_COMMUNITY): Payer: Self-pay | Admitting: Hematology & Oncology

## 2020-09-05 ENCOUNTER — Telehealth: Payer: Self-pay | Admitting: Hematology & Oncology

## 2020-09-05 DIAGNOSIS — D3A098 Benign carcinoid tumors of other sites: Secondary | ICD-10-CM

## 2020-09-05 NOTE — Telephone Encounter (Signed)
Called and advised wife of appointment date/time change per  9/24 sch msg.

## 2020-09-08 ENCOUNTER — Inpatient Hospital Stay: Payer: Medicare HMO | Admitting: Hematology & Oncology

## 2020-09-08 ENCOUNTER — Inpatient Hospital Stay: Payer: Medicare HMO

## 2020-09-09 ENCOUNTER — Other Ambulatory Visit: Payer: Self-pay

## 2020-09-09 ENCOUNTER — Ambulatory Visit (HOSPITAL_COMMUNITY): Payer: Medicare HMO

## 2020-09-09 ENCOUNTER — Ambulatory Visit (HOSPITAL_COMMUNITY)
Admission: RE | Admit: 2020-09-09 | Discharge: 2020-09-09 | Disposition: A | Discharge status: Home / Self Care | Payer: Medicare HMO | Source: Ambulatory Visit | Attending: Hematology & Oncology | Admitting: Hematology & Oncology

## 2020-09-09 DIAGNOSIS — R188 Other ascites: Secondary | ICD-10-CM | POA: Insufficient documentation

## 2020-09-09 DIAGNOSIS — D3A098 Benign carcinoid tumors of other sites: Secondary | ICD-10-CM | POA: Diagnosis not present

## 2020-09-09 HISTORY — PX: IR PARACENTESIS: IMG2679

## 2020-09-09 MED ORDER — ALBUMIN HUMAN 25 % IV SOLN
50.0000 g | Freq: Once | INTRAVENOUS | Status: AC
  Administered 2020-09-09: 50 g via INTRAVENOUS

## 2020-09-09 MED ORDER — LIDOCAINE HCL 1 % IJ SOLN
INTRAMUSCULAR | Status: AC
  Filled 2020-09-09: qty 20

## 2020-09-09 MED ORDER — LIDOCAINE HCL (PF) 1 % IJ SOLN
INTRAMUSCULAR | Status: AC | PRN
  Administered 2020-09-09: 10 mL

## 2020-09-09 MED ORDER — ALBUMIN HUMAN 25 % IV SOLN
INTRAVENOUS | Status: AC
  Filled 2020-09-09: qty 200

## 2020-09-09 NOTE — Procedures (Signed)
PROCEDURE SUMMARY:  Successful image-guided paracentesis from the right lower abdomen.  Yielded 6.2 liters of clear yellow fluid.  No immediate complications.  EBL: zero Patient tolerated well.   Specimen was not sent for labs.  Please see imaging section of Epic for full dictation.  Joaquim Nam PA-C 09/09/2020 2:16 PM

## 2020-09-15 ENCOUNTER — Encounter: Payer: Self-pay | Admitting: Hematology & Oncology

## 2020-09-15 ENCOUNTER — Encounter: Payer: Self-pay | Admitting: *Deleted

## 2020-09-15 ENCOUNTER — Other Ambulatory Visit: Payer: Self-pay

## 2020-09-15 ENCOUNTER — Inpatient Hospital Stay (HOSPITAL_BASED_OUTPATIENT_CLINIC_OR_DEPARTMENT_OTHER): Payer: Medicare HMO | Admitting: Hematology & Oncology

## 2020-09-15 ENCOUNTER — Inpatient Hospital Stay: Payer: Medicare HMO | Attending: Hematology & Oncology

## 2020-09-15 VITALS — BP 109/58 | HR 83 | Temp 98.7°F | Resp 16 | Wt 120.0 lb

## 2020-09-15 DIAGNOSIS — C7B02 Secondary carcinoid tumors of liver: Secondary | ICD-10-CM | POA: Insufficient documentation

## 2020-09-15 DIAGNOSIS — R188 Other ascites: Secondary | ICD-10-CM | POA: Insufficient documentation

## 2020-09-15 DIAGNOSIS — D3A098 Benign carcinoid tumors of other sites: Secondary | ICD-10-CM | POA: Diagnosis not present

## 2020-09-15 DIAGNOSIS — C7A098 Malignant carcinoid tumors of other sites: Secondary | ICD-10-CM | POA: Insufficient documentation

## 2020-09-15 DIAGNOSIS — K766 Portal hypertension: Secondary | ICD-10-CM | POA: Insufficient documentation

## 2020-09-15 LAB — CMP (CANCER CENTER ONLY)
ALT: 15 U/L (ref 0–44)
AST: 36 U/L (ref 15–41)
Albumin: 3.7 g/dL (ref 3.5–5.0)
Alkaline Phosphatase: 225 U/L — ABNORMAL HIGH (ref 38–126)
Anion gap: 9 (ref 5–15)
BUN: 40 mg/dL — ABNORMAL HIGH (ref 8–23)
CO2: 27 mmol/L (ref 22–32)
Calcium: 9.8 mg/dL (ref 8.9–10.3)
Chloride: 102 mmol/L (ref 98–111)
Creatinine: 1.66 mg/dL — ABNORMAL HIGH (ref 0.61–1.24)
GFR, Estimated: 38 mL/min — ABNORMAL LOW (ref >60)
Glucose, Bld: 126 mg/dL — ABNORMAL HIGH (ref 70–99)
Potassium: 4.7 mmol/L (ref 3.5–5.1)
Sodium: 138 mmol/L (ref 135–145)
Total Bilirubin: 1.1 mg/dL (ref 0.3–1.2)
Total Protein: 6.1 g/dL — ABNORMAL LOW (ref 6.5–8.1)

## 2020-09-15 LAB — CBC WITH DIFFERENTIAL (CANCER CENTER ONLY)
Abs Immature Granulocytes: 0.07 10*3/uL (ref 0.00–0.07)
Basophils Absolute: 0.1 10*3/uL (ref 0.0–0.1)
Basophils Relative: 1 %
Eosinophils Absolute: 0.2 10*3/uL (ref 0.0–0.5)
Eosinophils Relative: 2 %
HCT: 42.7 % (ref 39.0–52.0)
Hemoglobin: 14.4 g/dL (ref 13.0–17.0)
Immature Granulocytes: 1 %
Lymphocytes Relative: 17 %
Lymphs Abs: 2.1 10*3/uL (ref 0.7–4.0)
MCH: 34.1 pg — ABNORMAL HIGH (ref 26.0–34.0)
MCHC: 33.7 g/dL (ref 30.0–36.0)
MCV: 101.2 fL — ABNORMAL HIGH (ref 80.0–100.0)
Monocytes Absolute: 1.3 10*3/uL — ABNORMAL HIGH (ref 0.1–1.0)
Monocytes Relative: 10 %
Neutro Abs: 8.7 10*3/uL — ABNORMAL HIGH (ref 1.7–7.7)
Neutrophils Relative %: 69 %
Platelet Count: 260 10*3/uL (ref 150–400)
RBC: 4.22 MIL/uL (ref 4.22–5.81)
RDW: 13.7 % (ref 11.5–15.5)
WBC Count: 12.5 10*3/uL — ABNORMAL HIGH (ref 4.0–10.5)
nRBC: 0 % (ref 0.0–0.2)

## 2020-09-15 LAB — LACTATE DEHYDROGENASE: LDH: 155 U/L (ref 98–192)

## 2020-09-15 NOTE — Progress Notes (Signed)
Hematology and Oncology Follow Up Visit  Bruce Gray [id number] 1941-06-18 79 y.o. 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  Overall, there really has not been any change in his situation.  He still has a recurrent ascites.  He gets paracentesis every week.  His last chromogranin A level was about 2000.  This really has not come down despite the Somatuline.  He goes for his dotatate PET scan next week.  Hopefully this will tell us if these tumors are active so they can use a Lutathera.  He has had a little more abdominal discomfort.  Some of this is from the multiple paracenteses.  He has had no fever.  He has had no diarrhea.  He has had some nausea.  I still think that a peritoneal drainage catheter would not be a bad idea for him.  He has had no bleeding.  He does have some hemorrhoid issues.  Thankfully, there is no leg swelling.  He has had no cardiac issues.  There is no cough or shortness of breath.  Overall, his performance status is ECOG 1.   Medications:  Current Outpatient Medications:  .  docusate sodium (COLACE) 100 MG capsule, Take 100 mg by mouth daily as needed for mild constipation. , Disp: , Rfl:  .  lanreotide acetate (SOMATULINE DEPOT) 120 MG/0.5ML injection, Inject 120 mg into the skin every 14 (fourteen) days. Inject as a deep subcutaneous injection in the superior external quadrant of the buttock. Alternate injection site between the right and left sides., Disp: 0.5 mL, Rfl: 4 .  nystatin (MYCOSTATIN) 100000 UNIT/ML suspension, Take 5 mLs (500,000 Units total) by mouth 4 (four) times daily. Swish in mouth for 30 seconds, then spit out!! (Patient not taking: Reported on 07/29/2020), Disp: 120 mL, Rfl: 2 .   omeprazole (PRILOSEC) 20 MG capsule, Take 1 capsule (20 mg total) by mouth in the morning and at bedtime., Disp: 180 capsule, Rfl: 3 .  traMADol (ULTRAM) 50 MG tablet, Take 1 tablet (50 mg total) by mouth every 6 (six) hours as needed for moderate pain (every 8 hours)., Disp: 30 tablet, Rfl: 1  Allergies:  Allergies  Allergen Reactions  . Codeine Nausea And Vomiting  . Lansoprazole Diarrhea and Nausea And Vomiting    Past Medical History, Surgical history, Social history, and Family History were reviewed and updated.  Review of Systems: Review of Systems  Constitutional: Negative.   HENT:  Negative.   Eyes: Negative.   Respiratory: Negative.   Cardiovascular: Negative.   Gastrointestinal: Positive for diarrhea.  Endocrine: Negative.   Genitourinary: Negative.    Musculoskeletal: Negative.   Skin: Negative.   Neurological: Negative.   Hematological: Negative.   Psychiatric/Behavioral: Negative.     Physical Exam:  weight is 120 lb (54.4 kg). His oral temperature is 98.7 F (37.1 C). His blood pressure is 109/58 (abnormal) and his pulse is 83. His respiration is 16 and oxygen saturation is 100%.   Wt Readings from Last 3 Encounters:  09/15/20 120 lb (54.4 kg)  08/25/20 127 lb (57.6 kg)  08/11/20 131 lb (59.4 kg)    Physical Exam Vitals reviewed.  HENT:     Head: Normocephalic and atraumatic.  Eyes:  Pupils: Pupils are equal, round, and reactive to light.  Cardiovascular:     Rate and Rhythm: Normal rate and regular rhythm.     Heart sounds: Normal heart sounds.  Pulmonary:     Effort: Pulmonary effort is normal.     Breath sounds: Normal breath sounds.  Abdominal:     General: Bowel sounds are normal.     Palpations: Abdomen is soft.     Comments: His abdomen is somewhat distended.  There is a fluid wave.  There is no guarding or rebound tenderness.  There is no obvious hepatosplenomegaly.  Musculoskeletal:        General: No tenderness or deformity. Normal  range of motion.     Cervical back: Normal range of motion.  Lymphadenopathy:     Cervical: No cervical adenopathy.  Skin:    General: Skin is warm and dry.     Findings: No erythema or rash.  Neurological:     Mental Status: He is alert and oriented to person, place, and time.  Psychiatric:        Behavior: Behavior normal.        Thought Content: Thought content normal.        Judgment: Judgment normal.    Lab Results  Component Value Date   WBC 12.5 (H) 09/15/2020   HGB 14.4 09/15/2020   HCT 42.7 09/15/2020   MCV 101.2 (H) 09/15/2020   PLT 260 09/15/2020     Chemistry      Component Value Date/Time   NA 138 09/15/2020 1041   NA 139 02/03/2018 0843   K 4.7 09/15/2020 1041   CL 102 09/15/2020 1041   CO2 27 09/15/2020 1041   BUN 40 (H) 09/15/2020 1041   BUN 22 02/03/2018 0843   CREATININE 1.66 (H) 09/15/2020 1041      Component Value Date/Time   CALCIUM 9.8 09/15/2020 1041   ALKPHOS 225 (H) 09/15/2020 1041   AST 36 09/15/2020 1041   ALT 15 09/15/2020 1041   BILITOT 1.1 09/15/2020 1041       Impression and Plan: Bruce Gray is a very nice 79 year old white male.  He has metastatic carcinoid.   I just wish that his recurrent ascites would resolve.  I just do not know how this is happening.  Again there is no malignant cells in the ascites so I cannot imagine this being from the carcinoid.  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  We are holding the Somatuline for right now so that he can have the dotatate scan.  I would not think that if he is able to get Lutathera, he would get it within a month or so.  We will plan to get him back here in probably close to a month.  If we find that the chromogranin A level is not going down, then I am not sure we should continue him on the Somatuline.      Volanda Napoleon, MD 10/7/202112:10 PM

## 2020-09-15 NOTE — Progress Notes (Signed)
Oncology Nurse Navigator Documentation  Oncology Nurse Navigator Flowsheets 09/15/2020  Abnormal Finding Date -  Confirmed Diagnosis Date -  Diagnosis Status -  Planned Course of Treatment -  Phase of Treatment -  Hormonal Therapy Pending- Reason: -  Hormonal Actual Start Date: -  Navigator Follow Up Date: 10/14/2020  Navigator Follow Up Reason: Follow-up Appointment  Navigator Location [facility]  Referral Date to RadOnc/MedOnc -  Navigator Encounter Type Follow-up Appt  Telephone -  Treatment Initiated Date -  Patient Visit Type MedOnc  Treatment Phase Active Tx  Barriers/Navigation Needs Anxiety;Family Concerns;Morbidities/Frailty;Pain  Education -  Interventions Psycho-Social Support  Acuity Level 2-Minimal Needs (1-2 Barriers Identified)  Referrals -  Coordination of Care -  Education Method -  Support Groups/Services Friends and Family  Time Spent with Patient 15

## 2020-09-16 LAB — CHROMOGRANIN A: Chromogranin A (ng/mL): 1664 ng/mL — ABNORMAL HIGH (ref 0.0–101.8)

## 2020-09-21 ENCOUNTER — Telehealth: Payer: Self-pay | Admitting: *Deleted

## 2020-09-21 ENCOUNTER — Other Ambulatory Visit (HOSPITAL_COMMUNITY): Payer: Self-pay | Admitting: Hematology & Oncology

## 2020-09-21 ENCOUNTER — Other Ambulatory Visit: Payer: Self-pay | Admitting: Family

## 2020-09-21 DIAGNOSIS — D3A098 Benign carcinoid tumors of other sites: Secondary | ICD-10-CM

## 2020-09-21 DIAGNOSIS — R188 Other ascites: Secondary | ICD-10-CM

## 2020-09-21 DIAGNOSIS — E8809 Other disorders of plasma-protein metabolism, not elsewhere classified: Secondary | ICD-10-CM

## 2020-09-21 NOTE — Telephone Encounter (Signed)
Patient wife called stating that patient needs a paracentesis.  Dr Marin Olp notified.  Orders placed and appt made for this Friday.  Patient wife called with appt and is able to make this appt.  Appreciative of the call.

## 2020-09-22 ENCOUNTER — Ambulatory Visit (HOSPITAL_COMMUNITY)
Admission: RE | Admit: 2020-09-22 | Discharge: 2020-09-22 | Disposition: A | Discharge status: Home / Self Care | Payer: Medicare HMO | Source: Ambulatory Visit | Attending: Hematology & Oncology | Admitting: Hematology & Oncology

## 2020-09-22 ENCOUNTER — Other Ambulatory Visit: Payer: Self-pay

## 2020-09-22 DIAGNOSIS — D3A098 Benign carcinoid tumors of other sites: Secondary | ICD-10-CM | POA: Diagnosis not present

## 2020-09-22 DIAGNOSIS — C787 Secondary malignant neoplasm of liver and intrahepatic bile duct: Secondary | ICD-10-CM | POA: Diagnosis not present

## 2020-09-22 DIAGNOSIS — C7A1 Malignant poorly differentiated neuroendocrine tumors: Secondary | ICD-10-CM | POA: Diagnosis not present

## 2020-09-22 MED ORDER — COPPER CU 64 DOTATATE 1 MCI/ML IV SOLN
4.0000 | Freq: Once | INTRAVENOUS | Status: AC
  Administered 2020-09-22: 4 via INTRAVENOUS

## 2020-09-23 ENCOUNTER — Ambulatory Visit (HOSPITAL_COMMUNITY)
Admission: RE | Admit: 2020-09-23 | Discharge: 2020-09-23 | Disposition: A | Discharge status: Home / Self Care | Payer: Medicare HMO | Source: Ambulatory Visit | Attending: Family | Admitting: Family

## 2020-09-23 DIAGNOSIS — R188 Other ascites: Secondary | ICD-10-CM

## 2020-09-23 DIAGNOSIS — D3A098 Benign carcinoid tumors of other sites: Secondary | ICD-10-CM | POA: Diagnosis not present

## 2020-09-23 DIAGNOSIS — E8809 Other disorders of plasma-protein metabolism, not elsewhere classified: Secondary | ICD-10-CM | POA: Insufficient documentation

## 2020-09-23 HISTORY — PX: IR PARACENTESIS: IMG2679

## 2020-09-23 MED ORDER — LIDOCAINE HCL 1 % IJ SOLN
INTRAMUSCULAR | Status: AC
  Filled 2020-09-23: qty 20

## 2020-09-23 MED ORDER — LIDOCAINE HCL 1 % IJ SOLN
INTRAMUSCULAR | Status: DC | PRN
  Administered 2020-09-23: 10 mL

## 2020-09-23 NOTE — Procedures (Addendum)
PROCEDURE SUMMARY:  Successful image-guided paracentesis from the right lower abdomen.  Yielded 5.5 liters of clear, light yellow fluid.  No immediate complications.  EBL: zero Patient tolerated well.   Specimen was sent for labs.  Please see imaging section of Epic for full dictation.  Joaquim Nam PA-C 09/23/2020 10:50 AM

## 2020-09-26 LAB — CYTOLOGY - NON PAP

## 2020-09-27 ENCOUNTER — Encounter: Payer: Self-pay | Admitting: *Deleted

## 2020-09-27 NOTE — Progress Notes (Signed)
Oncology Nurse Navigator Documentation  Oncology Nurse Navigator Flowsheets 09/27/2020  Abnormal Finding Date -  Confirmed Diagnosis Date -  Diagnosis Status -  Planned Course of Treatment -  Phase of Treatment -  Hormonal Therapy Pending- Reason: -  Hormonal Actual Start Date: -  Navigator Follow Up Date: 10/14/2020  Navigator Follow Up Reason: Follow-up Appointment  Navigator Location [facility]  Referral Date to RadOnc/MedOnc -  Navigator Encounter Type Scan Review  Telephone -  Treatment Initiated Date -  Patient Visit Type MedOnc  Treatment Phase Active Tx  Barriers/Navigation Needs Anxiety;Family Concerns;Morbidities/Frailty;Pain  Education -  Interventions None Required  Acuity Level 2-Minimal Needs (1-2 Barriers Identified)  Referrals -  Coordination of Care -  Education Method -  Support Groups/Services Friends and Family  Time Spent with Patient 15

## 2020-09-30 ENCOUNTER — Ambulatory Visit (HOSPITAL_COMMUNITY): Admission: RE | Admit: 2020-09-30 | Payer: Medicare HMO | Source: Ambulatory Visit

## 2020-10-02 ENCOUNTER — Encounter: Payer: Self-pay | Admitting: *Deleted

## 2020-10-03 ENCOUNTER — Encounter: Payer: Self-pay | Admitting: Hematology & Oncology

## 2020-10-03 ENCOUNTER — Inpatient Hospital Stay: Payer: Medicare HMO

## 2020-10-03 ENCOUNTER — Encounter: Payer: Self-pay | Admitting: *Deleted

## 2020-10-03 ENCOUNTER — Other Ambulatory Visit: Payer: Self-pay

## 2020-10-03 ENCOUNTER — Inpatient Hospital Stay: Payer: Medicare HMO | Admitting: Hematology & Oncology

## 2020-10-03 ENCOUNTER — Other Ambulatory Visit: Payer: Self-pay | Admitting: Pharmacist

## 2020-10-03 VITALS — BP 114/61 | Temp 97.7°F | Resp 88 | Wt 125.0 lb

## 2020-10-03 DIAGNOSIS — D3A098 Benign carcinoid tumors of other sites: Secondary | ICD-10-CM

## 2020-10-03 DIAGNOSIS — C7B02 Secondary carcinoid tumors of liver: Secondary | ICD-10-CM | POA: Diagnosis not present

## 2020-10-03 DIAGNOSIS — R188 Other ascites: Secondary | ICD-10-CM | POA: Diagnosis not present

## 2020-10-03 DIAGNOSIS — E8809 Other disorders of plasma-protein metabolism, not elsewhere classified: Secondary | ICD-10-CM

## 2020-10-03 DIAGNOSIS — C7A098 Malignant carcinoid tumors of other sites: Secondary | ICD-10-CM | POA: Diagnosis not present

## 2020-10-03 DIAGNOSIS — K766 Portal hypertension: Secondary | ICD-10-CM | POA: Diagnosis not present

## 2020-10-03 LAB — CBC WITH DIFFERENTIAL (CANCER CENTER ONLY)
Abs Immature Granulocytes: 0.07 10*3/uL (ref 0.00–0.07)
Basophils Absolute: 0.1 10*3/uL (ref 0.0–0.1)
Basophils Relative: 1 %
Eosinophils Absolute: 0.2 10*3/uL (ref 0.0–0.5)
Eosinophils Relative: 2 %
HCT: 41.6 % (ref 39.0–52.0)
Hemoglobin: 14.1 g/dL (ref 13.0–17.0)
Immature Granulocytes: 1 %
Lymphocytes Relative: 16 %
Lymphs Abs: 1.8 10*3/uL (ref 0.7–4.0)
MCH: 34.4 pg — ABNORMAL HIGH (ref 26.0–34.0)
MCHC: 33.9 g/dL (ref 30.0–36.0)
MCV: 101.5 fL — ABNORMAL HIGH (ref 80.0–100.0)
Monocytes Absolute: 1.2 10*3/uL — ABNORMAL HIGH (ref 0.1–1.0)
Monocytes Relative: 10 %
Neutro Abs: 8.1 10*3/uL — ABNORMAL HIGH (ref 1.7–7.7)
Neutrophils Relative %: 70 %
Platelet Count: 256 10*3/uL (ref 150–400)
RBC: 4.1 MIL/uL — ABNORMAL LOW (ref 4.22–5.81)
RDW: 13.7 % (ref 11.5–15.5)
WBC Count: 11.4 10*3/uL — ABNORMAL HIGH (ref 4.0–10.5)
nRBC: 0 % (ref 0.0–0.2)

## 2020-10-03 LAB — CMP (CANCER CENTER ONLY)
ALT: 19 U/L (ref 0–44)
AST: 40 U/L (ref 15–41)
Albumin: 3.4 g/dL — ABNORMAL LOW (ref 3.5–5.0)
Alkaline Phosphatase: 228 U/L — ABNORMAL HIGH (ref 38–126)
Anion gap: 7 (ref 5–15)
BUN: 33 mg/dL — ABNORMAL HIGH (ref 8–23)
CO2: 25 mmol/L (ref 22–32)
Calcium: 9.5 mg/dL (ref 8.9–10.3)
Chloride: 104 mmol/L (ref 98–111)
Creatinine: 1.48 mg/dL — ABNORMAL HIGH (ref 0.61–1.24)
GFR, Estimated: 47 mL/min — ABNORMAL LOW (ref >60)
Glucose, Bld: 118 mg/dL — ABNORMAL HIGH (ref 70–99)
Potassium: 4.8 mmol/L (ref 3.5–5.1)
Sodium: 136 mmol/L (ref 135–145)
Total Bilirubin: 1 mg/dL (ref 0.3–1.2)
Total Protein: 6.2 g/dL — ABNORMAL LOW (ref 6.5–8.1)

## 2020-10-03 MED ORDER — LANREOTIDE ACETATE 120 MG/0.5ML ~~LOC~~ SOLN
120.0000 mg | Freq: Once | SUBCUTANEOUS | Status: AC
  Administered 2020-10-03: 120 mg via SUBCUTANEOUS

## 2020-10-03 NOTE — Patient Instructions (Signed)
Lanreotide injection What is this medicine? LANREOTIDE (lan REE oh tide) is used to reduce blood levels of growth hormone in patients with a condition called acromegaly. It also works to slow or stop tumor growth in patients with neuroendocrine tumors and treat carcinoid syndrome. This medicine may be used for other purposes; ask your health care provider or pharmacist if you have questions. COMMON BRAND NAME(S): Somatuline [place] What should I tell my health care provider before I take this medicine? They need to know if you have any of these conditions:  diabetes  gallbladder disease  heart disease  kidney disease  liver disease  thyroid disease  an unusual or allergic reaction to lanreotide, other medicines, foods, dyes, or preservatives  pregnant or trying to get pregnant  breast-feeding How should I use this medicine? This medicine is for injection under the skin. It is given by a health care professional in a hospital or clinic setting. Contact your pediatrician or health care professional regarding the use of this medicine in children. Special care may be needed. Overdosage: If you think you have taken too much of this medicine contact a poison control center or emergency room at once. NOTE: This medicine is only for you. Do not share this medicine with others. What if I miss a dose? It is important not to miss your dose. Call your doctor or health care professional if you are unable to keep an appointment. What may interact with this medicine? This medicine may interact with the following medications:  bromocriptine  cyclosporine  certain medicines for blood pressure, heart disease, irregular heart beat  certain medicines for diabetes  quinidine  terfenadine This list may not describe all possible interactions. Give your health care provider a list of all the medicines, herbs, non-prescription drugs, or dietary supplements you use. Also tell them if you smoke,  drink alcohol, or use illegal drugs. Some items may interact with your medicine. What should I watch for while using this medicine? Tell your doctor or healthcare professional if your symptoms do not start to get better or if they get worse. Visit your doctor or health care professional for regular checks on your progress. Your condition will be monitored carefully while you are receiving this medicine. This medicine may increase blood sugar. Ask your healthcare provider if changes in diet or medicines are needed if you have diabetes. You may need blood work done while you are taking this medicine. Women should inform their doctor if they wish to become pregnant or think they might be pregnant. There is a potential for serious side effects to an unborn child. Talk to your health care professional or pharmacist for more information. Do not breast-feed an infant while taking this medicine or for 6 months after stopping it. This medicine has caused ovarian failure in some women. This medicine may interfere with the ability to have a child. Talk with your doctor or health care professional if you are concerned about your fertility. What side effects may I notice from receiving this medicine? Side effects that you should report to your doctor or health care professional as soon as possible:  allergic reactions like skin rash, itching or hives, swelling of the face, lips, or tongue  increased blood pressure  severe stomach pain  signs and symptoms of hgh blood sugar such as being more thirsty or hungry or having to urinate more than normal. You may also feel very tired or have blurry vision.  signs and symptoms of low blood   sugar such as feeling anxious; confusion; dizziness; increased hunger; unusually weak or tired; sweating; shakiness; cold; irritable; headache; blurred vision; fast heartbeat; loss of consciousness  unusually slow heartbeat Side effects that usually do not require medical  attention (report to your doctor or health care professional if they continue or are bothersome):  constipation  diarrhea  dizziness  headache  muscle pain  muscle spasms  nausea  pain, redness, or irritation at site where injected This list may not describe all possible side effects. Call your doctor for medical advice about side effects. You may report side effects to FDA at 1-800-FDA-1088. Where should I keep my medicine? This drug is given in a hospital or clinic and will not be stored at home. NOTE: This sheet is a summary. It may not cover all possible information. If you have questions about this medicine, talk to your doctor, pharmacist, or health care provider.  2020 Elsevier/Gold Standard (2018-09-04 09:13:08)  

## 2020-10-03 NOTE — Progress Notes (Signed)
Pt discharged in no apparent distress. Pt left ambulatory without assistance. Pt aware of discharge instructions and verbalized understanding and had no further questions.  

## 2020-10-03 NOTE — Progress Notes (Signed)
See patient MyChart message  Spoke to Dr Marin Olp about patient symptoms, and his scans from last week. Dr Marin Olp would like to see patient today to discuss treatment plan updates and assess patient. Called and spoke to [place]. She will bring him in this afternoon.  Oncology Nurse Navigator Documentation  Oncology Nurse Navigator Flowsheets 10/03/2020  Abnormal Finding Date -  Confirmed Diagnosis Date -  Diagnosis Status -  Planned Course of Treatment -  Phase of Treatment -  Hormonal Therapy Pending- Reason: -  Hormonal Actual Start Date: -  Navigator Follow Up Date: 10/03/2020  Navigator Follow Up Reason: Follow-up Appointment  Navigator Location [facility]  Referral Date to RadOnc/MedOnc -  Navigator Encounter Type Telephone;Appt/Treatment Plan Review  Telephone Outgoing Call  Treatment Initiated Date -  Patient Visit Type MedOnc  Treatment Phase Active Tx  Barriers/Navigation Needs Anxiety;Family Concerns;Morbidities/Frailty;Pain  Education -  Interventions Coordination of Care  Acuity Level 2-Minimal Needs (1-2 Barriers Identified)  Referrals -  Coordination of Care Appts  Education Method -  Support Groups/Services Friends and Family  Time Spent with Patient 30

## 2020-10-04 ENCOUNTER — Telehealth: Payer: Self-pay | Admitting: Hematology & Oncology

## 2020-10-04 LAB — CHROMOGRANIN A: Chromogranin A (ng/mL): 1866 ng/mL — ABNORMAL HIGH (ref 0.0–101.8)

## 2020-10-04 LAB — LACTATE DEHYDROGENASE: LDH: 201 U/L — ABNORMAL HIGH (ref 98–192)

## 2020-10-04 NOTE — Telephone Encounter (Signed)
No los 10/25

## 2020-10-04 NOTE — Progress Notes (Signed)
Met with patient and his wife, prior to MD appointment. They believe they are ready for patient to have the pleurx abdominal drain placed. They asked a few questions regarding the drain.   Will follow up with patient tomorrow after Dr Marin Olp places new treatment plan orders.   Oncology Nurse Navigator Documentation  Oncology Nurse Navigator Flowsheets 10/03/2020  Abnormal Finding Date -  Confirmed Diagnosis Date -  Diagnosis Status -  Planned Course of Treatment -  Phase of Treatment -  Hormonal Therapy Pending- Reason: -  Hormonal Actual Start Date: -  Navigator Follow Up Date: 10/04/2020  Navigator Follow Up Reason: Appointment Review  Navigator Location [facility]  Referral Date to RadOnc/MedOnc -  Navigator Encounter Type Follow-up Appt  Telephone -  Treatment Initiated Date -  Patient Visit Type MedOnc  Treatment Phase Active Tx  Barriers/Navigation Needs Anxiety;Family Concerns;Morbidities/Frailty;Pain  Education -  Interventions Psycho-Social Support;Education  Acuity Level 2-Minimal Needs (1-2 Barriers Identified)  Referrals -  Coordination of Care -  Education Method Other  Support Groups/Services Friends and Family  Time Spent with Patient 30

## 2020-10-04 NOTE — Progress Notes (Unsigned)
Hematology and Oncology Follow Up Visit  Bruce Gray [id number] January 10, 1941 79 y.o. 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  I really think that there might be something else going on.  I am just very puzzled as to why his chromogranin A level has really not improved.  More we checked it today it was already up to about 1870.  He had the dotatate PET scan.  There is very little activity in his tumors that would suggest that this is a well differentiated neuroendocrine tumor.  Unfortunately, because of the low level of activity on the PET scan, he is not a candidate for Lutathera.  The size is still recurring quickly.  He really needs to have a paracentesis weekly.  He has agreed to have the indwelling peritoneal catheter placed so that he can have the fluid drained at home.  I really think this is a good idea.  We will go to see about a another biopsy.  I am not sure as to why he is not responding to the Somatuline.  I just have a bad feeling that there might be something else happening.  I cannot imagine that he has any malignant see such as pancreatic cancer.  I think if that were the case, we would be seen disease progression and we would be see malignant cells in his ascites.  Every time we do a cytology on the ascites there is negative cells to suggest malignancy.  His weight is not that good.  His appetite is not that good.  He is just behaving as if he has something more aggressive.  I know that he and his wife are trying their best.  I appreciate their efforts.  I really am impressed as to how well the work as a team.  We will have to see about a biopsy.  I do not know how this might be accomplished given that he has the  ascites.  I will have to speak to one of the Gastroenterologists and see if a biopsy can be done via endoscopic ultrasound.  Currently, his performance status is probably ECOG 2.   Medications:  Current Outpatient Medications:  .  docusate sodium (COLACE) 100 MG capsule, Take 100 mg by mouth daily as needed for mild constipation. , Disp: , Rfl:  .  FLUAD QUADRIVALENT 0.5 ML injection, , Disp: , Rfl:  .  lanreotide acetate (SOMATULINE DEPOT) 120 MG/0.5ML injection, Inject 120 mg into the skin every 14 (fourteen) days. Inject as a deep subcutaneous injection in the superior external quadrant of the buttock. Alternate injection site between the right and left sides., Disp: 0.5 mL, Rfl: 4 .  traMADol (ULTRAM) 50 MG tablet, Take 1 tablet (50 mg total) by mouth every 6 (six) hours as needed for moderate pain (every 8 hours)., Disp: 30 tablet, Rfl: 1  Allergies:  Allergies  Allergen Reactions  . Codeine Nausea And Vomiting  . Lansoprazole Diarrhea and Nausea And Vomiting    Past Medical History, Surgical history, Social history, and Family History were reviewed and updated.  Review of Systems: Review of Systems  Constitutional: Negative.   HENT:  Negative.   Eyes: Negative.   Respiratory: Negative.   Cardiovascular:  Negative.   Gastrointestinal: Positive for diarrhea.  Endocrine: Negative.   Genitourinary: Negative.    Musculoskeletal: Negative.   Skin: Negative.   Neurological: Negative.   Hematological: Negative.   Psychiatric/Behavioral: Negative.     Physical Exam:  weight is 125 lb (56.7 kg). His oral temperature is 97.7 F (36.5 C). His blood pressure is 114/61. His respiration is 88 (abnormal) and oxygen saturation is 99%.   Wt Readings from Last 3 Encounters:  10/03/20 125 lb (56.7 kg)  09/15/20 120 lb (54.4 kg)  08/25/20 127 lb (57.6 kg)    Physical Exam Vitals reviewed.  HENT:     Head: Normocephalic and atraumatic.  Eyes:     Pupils: Pupils are equal, round,  and reactive to light.  Cardiovascular:     Rate and Rhythm: Normal rate and regular rhythm.     Heart sounds: Normal heart sounds.  Pulmonary:     Effort: Pulmonary effort is normal.     Breath sounds: Normal breath sounds.  Abdominal:     General: Bowel sounds are normal.     Palpations: Abdomen is soft.     Comments: His abdomen is somewhat distended.  There is a fluid wave.  There is no guarding or rebound tenderness.  There is no obvious hepatosplenomegaly.  Musculoskeletal:        General: No tenderness or deformity. Normal range of motion.     Cervical back: Normal range of motion.  Lymphadenopathy:     Cervical: No cervical adenopathy.  Skin:    General: Skin is warm and dry.     Findings: No erythema or rash.  Neurological:     Mental Status: He is alert and oriented to person, place, and time.  Psychiatric:        Behavior: Behavior normal.        Thought Content: Thought content normal.        Judgment: Judgment normal.    Lab Results  Component Value Date   WBC 11.4 (H) 10/03/2020   HGB 14.1 10/03/2020   HCT 41.6 10/03/2020   MCV 101.5 (H) 10/03/2020   PLT 256 10/03/2020     Chemistry      Component Value Date/Time   NA 136 10/03/2020 1308   NA 139 02/03/2018 0843   K 4.8 10/03/2020 1308   CL 104 10/03/2020 1308   CO2 25 10/03/2020 1308   BUN 33 (H) 10/03/2020 1308   BUN 22 02/03/2018 0843   CREATININE 1.48 (H) 10/03/2020 1308      Component Value Date/Time   CALCIUM 9.5 10/03/2020 1308   ALKPHOS 228 (H) 10/03/2020 1308   AST 40 10/03/2020 1308   ALT 19 10/03/2020 1308   BILITOT 1.0 10/03/2020 1308       Impression and Plan: Bruce Gray is a very nice 79 year old white male.  He has metastatic carcinoid.  By the pathology, this is a well differentiated tumor but it is certainly not behaving that way.  We will give him another dose of Somatuline.  Again is hard to tell if this is working given that the chromogranin A level has not really gone  down.  He is just not a candidate for anything more aggressive.  There is certainly no surgical option.  Again we will try to see if a biopsy can be accomplished.  I know this would be quite challenging.  Hopefully, we can have the peritoneal catheter placed this week when he has the ascites drained.  As  always, we had a very good prayer at the end of our meeting.  I just am impressed and I admire Bruce Gray' attitude.  His wife is so good and she really is a strength for him.  We will have to keep following him very closely.  I will have to probably get him back in another 3 or 4 weeks.  I really would like to make sure that his quality of life is better come the holiday season.   Volanda Napoleon, MD 10/26/20215:37 PM

## 2020-10-05 ENCOUNTER — Encounter: Payer: Self-pay | Admitting: *Deleted

## 2020-10-05 NOTE — Progress Notes (Signed)
Patient has still not been scheduled for peritoneal catheter placement. Reached out to [place] and was able to get patient scheduled for this Friday. They will reach out to wife with instructions.   Oncology Nurse Navigator Documentation  Oncology Nurse Navigator Flowsheets 10/05/2020  Abnormal Finding Date -  Confirmed Diagnosis Date -  Diagnosis Status -  Planned Course of Treatment -  Phase of Treatment -  Hormonal Therapy Pending- Reason: -  Hormonal Actual Start Date: -  Navigator Follow Up Date: 10/07/2020  Navigator Follow Up Reason: Scan Review  Navigator Location [facility]  Referral Date to RadOnc/MedOnc -  Navigator Encounter Type MyChart  Telephone -  Treatment Initiated Date -  Patient Visit Type MedOnc  Treatment Phase Active Tx  Barriers/Navigation Needs Anxiety;Family Concerns;Morbidities/Frailty;Pain  Education -  Interventions Coordination of Care;Psycho-Social Support  Acuity Level 2-Minimal Needs (1-2 Barriers Identified)  Referrals -  Coordination of Care Radiology  Education Method -  Support Groups/Services Friends and Family  Time Spent with Patient 30

## 2020-10-06 ENCOUNTER — Other Ambulatory Visit: Payer: Self-pay

## 2020-10-06 ENCOUNTER — Telehealth: Payer: Self-pay | Admitting: Gastroenterology

## 2020-10-06 ENCOUNTER — Encounter: Payer: Self-pay | Admitting: *Deleted

## 2020-10-06 DIAGNOSIS — K869 Disease of pancreas, unspecified: Secondary | ICD-10-CM

## 2020-10-06 DIAGNOSIS — D3A098 Benign carcinoid tumors of other sites: Secondary | ICD-10-CM

## 2020-10-06 NOTE — Telephone Encounter (Signed)
Thanks so much. GM

## 2020-10-06 NOTE — Telephone Encounter (Signed)
Pt's wife is requesting a call back from a nurse, pt states the pt has been fully vaccinated against covid and does not need to come for a covid test

## 2020-10-06 NOTE — Telephone Encounter (Signed)
The pt has been advised that the hospital requirement is that the pt gets COVID testing prior to the procedure despite immunization status. The pt has been advised of the information and verbalized understanding.

## 2020-10-06 NOTE — Telephone Encounter (Signed)
EUS scheduled for 10/21/20 at [facility] with Dr Rush Landmark COVID test on 10/18/20 at 935 am

## 2020-10-06 NOTE — Telephone Encounter (Signed)
I spoke with Dr. Marin Olp about this patient.  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  However the patient's symptoms continue to be present and there is concern whether the lesion in the pancreas could actually be an underlying adenocarcinoma.  Interestingly, the dotatate scan did not show evidence of the pancreas being lit up which takes away the likelihood of being a pancreatic neuroendocrine tumor.  As such, it is reasonable to consider endoscopic ultrasound with fine-needle biopsy. We will work on trying to arrange this with Dr. Ardis Hughs or myself in the coming weeks. Patty, please reach out to the patient and work on getting him scheduled for an EGD/EUS with FNB attempt.  I have a purple block on 11/12 in the AM, so this could be a landing spot.  Please update Dr. Marin Olp and Dr. Ardis Hughs and I with the date of schedule.   Bruce Britain, MD [facility] Office # [phone]

## 2020-10-06 NOTE — Telephone Encounter (Signed)
The pt has been scheduled and instructed.  All questions answered.  They have access in My Chart as well and confirmed receipt.

## 2020-10-07 ENCOUNTER — Other Ambulatory Visit: Payer: Self-pay | Admitting: Radiology

## 2020-10-07 ENCOUNTER — Ambulatory Visit (HOSPITAL_COMMUNITY)
Admission: RE | Admit: 2020-10-07 | Discharge: 2020-10-07 | Disposition: A | Discharge status: Home / Self Care | Payer: Medicare HMO | Source: Ambulatory Visit | Attending: Hematology & Oncology | Admitting: Hematology & Oncology

## 2020-10-07 ENCOUNTER — Other Ambulatory Visit: Payer: Self-pay | Admitting: Hematology & Oncology

## 2020-10-07 ENCOUNTER — Other Ambulatory Visit: Payer: Self-pay

## 2020-10-07 DIAGNOSIS — C7B02 Secondary carcinoid tumors of liver: Secondary | ICD-10-CM | POA: Insufficient documentation

## 2020-10-07 DIAGNOSIS — D3A098 Benign carcinoid tumors of other sites: Secondary | ICD-10-CM

## 2020-10-07 DIAGNOSIS — Z87891 Personal history of nicotine dependence: Secondary | ICD-10-CM | POA: Insufficient documentation

## 2020-10-07 DIAGNOSIS — R188 Other ascites: Secondary | ICD-10-CM | POA: Insufficient documentation

## 2020-10-07 DIAGNOSIS — C7A098 Malignant carcinoid tumors of other sites: Secondary | ICD-10-CM | POA: Insufficient documentation

## 2020-10-07 HISTORY — PX: IR GUIDED DRAIN W CATHETER PLACEMENT: IMG719

## 2020-10-07 HISTORY — PX: IR PERC TUN PERIT CATH WO PORT S&I /IMAG: IMG2327

## 2020-10-07 HISTORY — PX: IR US GUIDANCE: IMG2393

## 2020-10-07 LAB — PROTIME-INR
INR: 1 (ref 0.8–1.2)
Prothrombin Time: 13 seconds (ref 11.4–15.2)

## 2020-10-07 LAB — ALBUMIN, PLEURAL OR PERITONEAL FLUID: Albumin, Fluid: <1 g/dL

## 2020-10-07 LAB — GLUCOSE, CAPILLARY: Glucose-Capillary: 101 mg/dL — ABNORMAL HIGH (ref 70–99)

## 2020-10-07 MED ORDER — ALBUMIN HUMAN 25 % IV SOLN
INTRAVENOUS | Status: AC
  Administered 2020-10-07: 12.5 g
  Filled 2020-10-07: qty 200

## 2020-10-07 MED ORDER — ALBUMIN HUMAN 25 % IV SOLN
50.0000 g | Freq: Once | INTRAVENOUS | Status: AC
  Administered 2020-10-07: 50 g via INTRAVENOUS
  Filled 2020-10-07: qty 200

## 2020-10-07 MED ORDER — FENTANYL CITRATE (PF) 100 MCG/2ML IJ SOLN
INTRAMUSCULAR | Status: AC | PRN
Start: 2020-10-07 — End: 2020-10-07
  Administered 2020-10-07: 25 ug via INTRAVENOUS

## 2020-10-07 MED ORDER — CEFAZOLIN SODIUM-DEXTROSE 2-4 GM/100ML-% IV SOLN
INTRAVENOUS | Status: AC
  Filled 2020-10-07: qty 100

## 2020-10-07 MED ORDER — MIDAZOLAM HCL 2 MG/2ML IJ SOLN
INTRAMUSCULAR | Status: AC | PRN
  Administered 2020-10-07: 1 mg via INTRAVENOUS

## 2020-10-07 MED ORDER — FENTANYL CITRATE (PF) 100 MCG/2ML IJ SOLN
INTRAMUSCULAR | Status: AC
  Filled 2020-10-07: qty 2

## 2020-10-07 MED ORDER — CEFAZOLIN SODIUM-DEXTROSE 2-4 GM/100ML-% IV SOLN
2.0000 g | INTRAVENOUS | Status: AC
  Administered 2020-10-07: 2 g via INTRAVENOUS

## 2020-10-07 MED ORDER — MIDAZOLAM HCL 2 MG/2ML IJ SOLN
INTRAMUSCULAR | Status: AC
  Filled 2020-10-07: qty 2

## 2020-10-07 NOTE — H&P (Signed)
Chief Complaint: Patient was seen in consultation today for peritoneal pleurx catheter at the request of Ennever,Peter R  Referring Physician(s): Ennever,Peter R  Supervising Physician: Corrie Mckusick  Patient Status: [facility] - Out-pt  History of Present Illness: Bruce Gray is a 79 y.o. male with hx of carcinoid tumor of the pancreas with mets to the liver. He also has recurrent large volume ascites requiring frequent paracentesis. He and his oncology team have discussed placement of peritoneal pleurX catheter so that he can drain the ascites at home and not have to return to the hospital. They are in favor of this and as such, he has been scheduled. PMHx, meds, labs, imaging, allergies reviewed. Feels well, no recent fevers, chills, illness. Has been NPO today as directed.   Past Medical History:  Diagnosis Date  . CAD (coronary artery disease)   . 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 Complex sleep apnea syndrome    does not use a cpap-cannot  . Diabetes mellitus ([place])    type 2  . Gout   . Heart disease   . High cholesterol   . HOH (hard of hearing)   . Hypertension   . Liver disease   . Mild renal insufficiency   . 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 CARDIAC CATHETERIZATION  86,2010  . CARDIOVASCULAR STRESS TEST  2008  . CHOLECYSTECTOMY  2011  . COLONOSCOPY    . CORONARY ARTERY BYPASS GRAFT  1986  . DOPPLER ECHOCARDIOGRAPHY     mild concentric LVH with mild grade 1 diastolic dysfunction with normal tissue doppler parameters  . HEMORRHOID SURGERY  2007  . INSERTION PROSTATE RADIATION SEED  2013  . IR PARACENTESIS  08/16/2020  . IR PARACENTESIS  09/09/2020  . IR PARACENTESIS  09/23/2020  . IR RADIOLOGIST EVAL & MGMT  07/20/2020  . KNEE ARTHROSCOPY WITH MEDIAL MENISECTOMY Left 08/20/2014   Procedure: LEFT KNEE ARTHROSCOPY WITH DEBRIDEMENT/SHAVING (CHONDROPLASTY), MEDIAL MENISECTOMY, EXCISION OF  PLICA;  Surgeon: Ninetta Lights, MD;  Location: [place];  Service: Orthopedics;  Laterality: Left;  . TONSILLECTOMY      Allergies: Codeine and Lansoprazole  Medications: Prior to Admission medications   Medication Sig Start Date End Date Taking? Authorizing Provider  acetaminophen (TYLENOL) 325 MG tablet Take 650 mg by mouth every 6 (six) hours as needed for moderate pain or headache.   Yes [provider]  docusate sodium (COLACE) 100 MG capsule Take 100 mg by mouth daily as needed for mild constipation.    Yes [provider]  lanreotide acetate (SOMATULINE DEPOT) 120 MG/0.5ML injection Inject 120 mg into the skin every 14 (fourteen) days. Inject as a deep subcutaneous injection in the superior external quadrant of the buttock. Alternate injection site between the right and left sides. Patient taking differently: Inject 120 mg into the skin every 28 (twenty-eight) days. Inject as a deep subcutaneous injection in the superior external quadrant of the buttock. Alternate injection site between the right and left sides. 08/12/20  Yes Volanda Napoleon, MD  omeprazole (PRILOSEC) 20 MG capsule Take 20 mg by mouth daily as needed (reflux).   Yes [provider]  traMADol (ULTRAM) 50 MG tablet Take 1 tablet (50 mg total) by mouth every 6 (six) hours as needed for moderate pain (every 8 hours). 08/17/20  Yes Volanda Napoleon, MD  FLUAD QUADRIVALENT 0.5 ML injection  08/22/20  [provider]     Family History  Problem Relation Age of Onset  . Cancer - Other Mother        breast  . Stroke Father     Social History   Socioeconomic History  . Marital status: Married    Spouse name: Psychologist, occupational  . Number of children: 2  . Years of education: 6  . Highest education level: Not on file  Occupational History  . Occupation: retired  Tobacco Use  . Smoking status: Former Smoker    Quit date: 07/03/1958    Years since quitting: 62.3  .  Smokeless tobacco: Never Used  Vaping Use  . Vaping Use: Never used  Substance and Sexual Activity  . Alcohol use: No  . Drug use: No  . Sexual activity: Not on file  Other Topics Concern  . Not on file  Social History Narrative  . Not on file   Social Determinants of Health   Financial Resource Strain:   . Difficulty of Paying Living Expenses: Not on file  Food Insecurity:   . Worried About Charity fundraiser in the Last Year: Not on file  . Ran Out of Food in the Last Year: Not on file  Transportation Needs:   . Lack of Transportation (Medical): Not on file  . Lack of Transportation (Non-Medical): Not on file  Physical Activity:   . Days of Exercise per Week: Not on file  . Minutes of Exercise per Session: Not on file  Stress:   . Feeling of Stress : Not on file  Social Connections:   . Frequency of Communication with Friends and Family: Not on file  . Frequency of Social Gatherings with Friends and Family: Not on file  . Attends Religious Services: Not on file  . Active Member of Clubs or Organizations: Not on file  . Attends Archivist Meetings: Not on file  . Marital Status: Not on file    Review of Systems: A 12 point ROS discussed and pertinent positives are indicated in the HPI above.  All other systems are negative.  Review of Systems  Vital Signs: BP 126/61   Pulse 94   Temp 97.8 F (36.6 C) (Oral)   Resp 18   Ht 5\' 5"  (1.651 m)   Wt 56.6 kg   SpO2 98%   BMI 20.77 kg/m   Physical Exam Constitutional:      Appearance: Normal appearance.  HENT:     Mouth/Throat:     Mouth: Mucous membranes are moist.     Pharynx: Oropharynx is clear.  Cardiovascular:     Rate and Rhythm: Normal rate and regular rhythm.     Heart sounds: Normal heart sounds.  Pulmonary:     Effort: Pulmonary effort is normal. No respiratory distress.     Breath sounds: Normal breath sounds.  Abdominal:     General: There is distension.     Palpations: Abdomen is  soft.     Tenderness: There is no abdominal tenderness.  Skin:    General: Skin is warm and dry.  Neurological:     Mental Status: He is alert and oriented to person, place, and time.  Psychiatric:        Mood and Affect: Mood normal.        Thought Content: Thought content normal.        Judgment: Judgment normal.     Imaging: NM PET (CU-64 DETECTNET)SKULL TO MID THIGH  Addendum  Date: 09/26/2020   ADDENDUM REPORT: 09/26/2020 09:41 ADDENDUM: The patient deemed not a good candidate for DOTATATE peptide receptor radiotherapy as no significant radiotracer activity (low somatostatin receptor activity) within the pancreatic lesion, liver metastasis, or pulmonary lesion. Favor atypical primary neuroendocrine tumor of the pancreas. Consider pancreatic biopsy to confirm grade 1 neuroendocrine tumor. Findings conveyed toPETER ENNEVER on 09/26/2020  at9:22. Electronically Signed   By: Suzy Bouchard M.D.   On: 09/26/2020 09:41   Result Date: 09/26/2020 CLINICAL DATA:  Well differentiated neuroendocrine tumor demonstrated on liver biopsy. Liver metastasis demonstrated on MRI. Lesion in the pancreatic tail EXAM: NUCLEAR MEDICINE PET SKULL BASE TO THIGH TECHNIQUE: 4.1 mCi Ga 68/Cu 64 DOTATATE was injected intravenously. Full-ring PET imaging was performed from the skull base to thigh after the radiotracer. CT data was obtained and used for attenuation correction and anatomic localization. COMPARISON:  MRI 06/15/2020 FINDINGS: NECK No radiotracer activity in neck lymph nodes. Incidental CT findings: None CHEST Rounded RIGHT upper lobe pulmonary nodule measures 1.4 cm on image 64 and has low radiotracer activity with SUV max equal 1.9. No hypermetabolic or enlarged mediastinal nodes. Incidental CT finding:Post CABG ABDOMEN/PELVIS There is confluent radiotracer activity within the RIGHT hepatic lobe with relatively low radiotracer activity (SUV max equal 5.8). Less radiotracer activity in the LEFT hepatic lobe  SUV max equal 2.8. There is no discrete rounded radiotracer avid lesions as seen on comparison MRI. For example lesion in the LEFT lateral hepatic lobe on comparison MRI measuring 3.3 cm is vaguely seen on comparison CT exam without increased radiotracer activity. Several partially calcified lesions in the RIGHT hepatic lobe which do not have a clear associated radiotracer activity. Additionally, there is no increased radiotracer activity within the ovoid lesion in the tail the pancreas measuring 2.7 cm. This lesion also has partial calcifications. Activity is low in this lesion with SUV max equal 2.5 which is less than background pancreatic normal activity. Typical normal activity within the adrenal glands and kidneys. No abnormal activity within the bowel Physiologic activity noted in the liver, spleen, adrenal glands and kidneys. Incidental CT findings:Large volume ascites noted. Brachytherapy seeds in the prostate gland SKELETON No focal activity to suggest skeletal metastasis. Incidental CT findings:None IMPRESSION: 1. Unusual pattern of radiotracer activity. Mild radiotracer activity within the RIGHT hepatic lobe and low radiotracer activity in the LEFT hepatic lobe. 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 Discrete lesions seen on comparison MRI do not have associated radiotracer activity. 2. Additionally, the lesion in the tail of the pancreas has radiotracer activity less than normal pancreatic tissue which would not be typical of well differentiated neuroendocrine tumor from the pancreas or GI tract. Consider biopsy of pancreatic lesion. 3. Mild radiotracer activity associated with a RIGHT upper lobe pulmonary nodule. 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 Bronchial carcinoid can have less somatostatin radiotracer activity than pancreatic GI neuroendocrine tumor. 4. Large volume ascites. Electronically  Signed: By: Suzy Bouchard M.D. On: 09/24/2020 09:02   IR Paracentesis  Result Date: 09/23/2020 INDICATION: Patient with history of metastatic carcinoid tumor of the pancreas, recurrent ascites. Request to IR for diagnostic and therapeutic paracentesis. EXAM: ULTRASOUND GUIDED DIAGNOSTIC AND THERAPEUTIC PARACENTESIS MEDICATIONS: 10 mL 1% lidocaine COMPLICATIONS: None immediate. PROCEDURE: Informed written consent was obtained from the patient after a discussion of the risks, benefits and alternatives to treatment. A timeout was performed prior to the initiation of the procedure. Initial ultrasound  scanning demonstrates a large amount of ascites within the right lower abdominal quadrant. The right lower abdomen was prepped and draped in the usual sterile fashion. 1% lidocaine was used for local anesthesia. Following this, a 19 gauge, 7-cm, Yueh catheter was introduced. An ultrasound image was saved for documentation purposes. The paracentesis was performed. The catheter was removed and a dressing was applied. The patient tolerated the procedure well without immediate post procedural complication. FINDINGS: A total of approximately 5.5 L of clear, light yellow fluid was removed. Samples were sent to the laboratory as requested by the clinical team. IMPRESSION: Successful ultrasound-guided paracentesis yielding 5.5 liters of peritoneal fluid. Read by Candiss Norse, PA-C Electronically Signed   By: Ruthann Cancer MD   On: 09/23/2020 11:14   IR Paracentesis  Result Date: 09/09/2020 INDICATION: Patient with history of metastatic carcinoid tumor of the pancreas, recurrent ascites. Request to IR for therapeutic paracentesis. EXAM: ULTRASOUND GUIDED THERAPEUTIC PARACENTESIS MEDICATIONS: 10 mL 1% lidocaine COMPLICATIONS: None immediate. PROCEDURE: Informed written consent was obtained from the patient after a discussion of the risks, benefits and alternatives to treatment. A timeout was performed prior to the  initiation of the procedure. Initial ultrasound scanning demonstrates a large amount of ascites within the right lower abdominal quadrant. The right lower abdomen was prepped and draped in the usual sterile fashion. 1% lidocaine was used for local anesthesia. Following this, a 19 gauge, 7-cm, Yueh catheter was introduced. An ultrasound image was saved for documentation purposes. The paracentesis was performed. The catheter was removed and a dressing was applied. The patient tolerated the procedure well without immediate post procedural complication. Patient received post-procedure intravenous albumin; see nursing notes for details. FINDINGS: A total of approximately 6.2 L of clear yellow fluid was removed. IMPRESSION: Successful ultrasound-guided paracentesis yielding 6.2 liters of peritoneal fluid. Read by Candiss Norse, PA-C Electronically Signed   By: Corrie Mckusick D.O.   On: 09/09/2020 14:56    Labs:  CBC: Recent Labs    08/11/20 1421 08/25/20 1500 09/15/20 1041 10/03/20 1308  WBC 13.5* 12.6* 12.5* 11.4*  HGB 14.3 13.6 14.4 14.1  HCT 42.3 40.3 42.7 41.6  PLT 300 248 260 256    COAGS: Recent Labs    02/16/20 1126 05/30/20 1125  INR 1.1 1.1    BMP: Recent Labs    07/05/20 1155 07/05/20 1155 07/29/20 0849 07/29/20 0849 08/11/20 1421 08/25/20 1500 09/15/20 1041 10/03/20 1308  NA 135   < > 137   < > 138 141 138 136  K 4.7   < > 4.9   < > 4.5 4.8 4.7 4.8  CL 97*   < > 101   < > 103 107 102 104  CO2 28   < > 28   < > 26 26 27 25   GLUCOSE 123*   < > 117*   < > 117* 104* 126* 118*  BUN 49*   < > 30*   < > 32* 33* 40* 33*  CALCIUM 10.1   < > 9.6   < > 9.7 10.0 9.8 9.5  CREATININE 1.96*   < > 1.66*   < > 1.53* 1.28* 1.66* 1.48*  GFRNONAA 31*   < > 38*   < > 42* 52* 38* 47*  GFRAA 36*  --  44*  --  49* >60  --   --    < > = values in this interval not displayed.    LIVER FUNCTION TESTS: Recent Labs  08/11/20 1421 08/25/20 1500 09/15/20 1041 10/03/20 1308    BILITOT 1.2 1.2 1.1 1.0  AST 37 27 36 40  ALT 17 11 15 19   ALKPHOS 263* 173* 225* 228*  PROT 6.4* 6.0* 6.1* 6.2*  ALBUMIN 3.4* 3.9 3.7 3.4*    TUMOR MARKERS: No results for input(s): AFPTM, CEA, CA199, CHROMGRNA in the last 8760 hours.  Assessment and Plan: Metastatic carcinoid with recurrent ascites Plan for placement of tunneled peritoneal catheter for home drainage. Explained role for this catheter as well as risks of infection/peritonitis. Risks and benefits discussed with the patient including bleeding, infection, damage to adjacent structures, bowel perforation/fistula connection, and sepsis.  All of the patient's questions were answered, patient is agreeable to proceed. Consent signed and in chart.   Thank you for this interesting consult.  I greatly enjoyed meeting Bruce Gray and look forward to participating in their care.  A copy of this report was sent to the requesting provider on this date.  Electronically Signed: Ascencion Dike, PA-C 10/07/2020, 1:21 PM   I spent a total of 20 minutes in face to face in clinical consultation, greater than 50% of which was counseling/coordinating care for peritoneal pleurX

## 2020-10-07 NOTE — Discharge Instructions (Addendum)
Ascites Drainage Catheter Home Guide An ascites drainage catheter is a thin, flexible tube that helps drain excess fluid that has collected in the abdomen (ascites). Draining the fluid can help prevent pain and keep other problems from developing. There are several kinds of ascites drainage systems. Follow general guidelines as well as instructions about your specific drainage system. Your health care provider will instruct you on:  How to use your system.  How much fluid to drain.  How often to drain the fluid. Call your health care provider if you have any questions or concerns. What are the risks? The ascites drainage catheter system is generally safe. However, problems may occur, including:  Infection.  Bleeding.  A dislodged or blocked catheter. Supplies needed:  Soap and warm water.  A mask.  Alcohol wipes.  Gauze.  Paper tape.  Drainage container.  Alcohol-based cleaner or bleach-based cleaner. How to use the catheter to drain fluid  1. Clean any surface that you will be using. 2. Wash your hands with soap and warm water. If your hands are not visibly dirty, you can use an alcohol-based skin cleanser instead. 3. Put on a mask. Make sure that it covers your nose and mouth. 4. Place all the supplies you will need on the surfaces that you have cleaned. Make sure that you can easily reach them. 5. Clean the safety valve on the end of the drainage catheter with an alcohol wipe. 6. Attach the adapter or access tip to the safety valve on your catheter. To do this, you may need to remove a protective cap on the safety valve. 7. Release the clamps on your drainage container. 8. Allow the fluid to drain into the container. 9. Close the clamps and release the catheter from the drainage container. 10. Write down the amount of fluid that was drained, if your health care provider has asked you to do that. 11. Follow instructions from your health care provider about collecting  and transporting the fluid. Your health care provider may ask you to collect the fluid and take it to the lab to be analyzed. If you are told to dispose of the fluid, pour it down the toilet or do as told by your health care provider. 12. Clean the surfaces where any fluid was spilled with an alcohol-based or bleach-based cleaner. What should I do after I drain the fluid? 1. Wipe the end of your catheter with an alcohol wipe. 2. Put the protective cap back onto the safety valve. 3. Follow instructions from your health care provider and: ? Place gauze bandages (dressings) around the catheter area. ? Secure the catheter to your body with gauze and tape. Contact a health care provider if you have:  Skin irritation, redness, or pain where the catheter was inserted (insertion site).  Trouble draining fluid.  Trouble caring for your catheter.  Abdominal pain, bloating, or cramping. Get help right away if you:  Develop a fever or chills.  Have increasing redness or increasing pain around the insertion site.  Have blood or pus coming from the insertion site.  Notice that the fluid that drains out of the catheter becomes cloudy or has a bad smell.  Have abdominal pain or cramping that worsens or does not go away. Summary  An ascites drainage catheter is a thin, flexible tube that helps drain excess fluid that has collected in your abdomen.  There are several kinds of ascites drainage systems. Follow general guidelines as well as your health care provider's  instructions about your specific drainage system.  Follow instructions from your health care provider about collecting and transporting the fluid. Your health care provider may ask you to collect the fluid and take it to the lab to be analyzed. This information is not intended to replace advice given to you by your health care provider. Make sure you discuss any questions you have with your health care provider. Document Revised:  10/28/2018 Document Reviewed: 10/16/2017 Elsevier Patient Education  [place]. Paracentesis, Care After This sheet gives you information about how to care for yourself after your procedure. Your health care provider may also give you more specific instructions. If you have problems or questions, contact your health care provider. What can I expect after the procedure? After the procedure, it is common to have a small amount of clear fluid coming from the puncture site. Follow these instructions at home: Puncture site care   Follow instructions from your health care provider about how to take care of your puncture site. Make sure you: ? Wash your hands with soap and water before and after you change your bandage (dressing). If soap and water are not available, use hand sanitizer. ? Change your dressing as told by your health care provider.  Check your puncture area every day for signs of infection. Check for: ? Redness, swelling, or pain. ? More fluid or blood. ? Warmth. ? Pus or a bad smell. General instructions  Return to your normal activities as told by your health care provider. Ask your health care provider what activities are safe for you.  Take over-the-counter and prescription medicines only as told by your health care provider.  Do not take baths, swim, or use a hot tub until your health care provider approves. Ask your health care provider if you may take showers. You may only be allowed to take sponge baths.  Keep all follow-up visits as told by your health care provider. This is important. Contact a health care provider if:  You have redness, swelling, or pain at your puncture site.  You have more fluid or blood coming from your puncture site.  Your puncture site feels warm to the touch.  You have pus or a bad smell coming from your puncture site.  You have a fever. Get help right away if:  You have chest pain or shortness of breath.  You develop  increasing pain, discomfort, or swelling in your abdomen.  You feel dizzy or light-headed or you faint. Summary  After the procedure, it is common to have a small amount of clear fluid coming from the puncture site.  Follow instructions from your health care provider about how to take care of your puncture site.  Check your puncture area every day signs of infection.  Keep all follow-up visits as told by your health care provider. This information is not intended to replace advice given to you by your health care provider. Make sure you discuss any questions you have with your health care provider. Document Revised: 06/09/2019 Document Reviewed: 09/16/2018 Elsevier Patient Education  [place]. Moderate Conscious Sedation, Adult Sedation is the use of medicines to promote relaxation and relieve discomfort and anxiety. Moderate conscious sedation is a type of sedation. Under moderate conscious sedation, you are less alert than normal, but you are still able to respond to instructions, touch, or both. Moderate conscious sedation is used during short medical and dental procedures. It is milder than deep sedation, which is a type of sedation  under which you cannot be easily woken up. It is also milder than general anesthesia, which is the use of medicines to make you unconscious. Moderate conscious sedation allows you to return to your regular activities sooner. Tell a health care provider about:  Any allergies you have.  All medicines you are taking, including vitamins, herbs, eye drops, creams, and over-the-counter medicines.  Use of steroids (by mouth or creams).  Any problems you or family members have had with sedatives and anesthetic medicines.  Any blood disorders you have.  Any surgeries you have had.  Any medical conditions you have, such as sleep apnea.  Whether you are pregnant or may be pregnant.  Any use of cigarettes, alcohol, marijuana, or street drugs. What  are the risks? Generally, this is a safe procedure. However, problems may occur, including:  Getting too much medicine (oversedation).  Nausea.  Allergic reaction to medicines.  Trouble breathing. If this happens, a breathing tube may be used to help with breathing. It will be removed when you are awake and breathing on your own.  Heart trouble.  Lung trouble. What happens before the procedure? Staying hydrated Follow instructions from your health care provider about hydration, which may include:  Up to 2 hours before the procedure - you may continue to drink clear liquids, such as water, clear fruit juice, black coffee, and plain tea. Eating and drinking restrictions Follow instructions from your health care provider about eating and drinking, which may include:  8 hours before the procedure - stop eating heavy meals or foods such as meat, fried foods, or fatty foods.  6 hours before the procedure - stop eating light meals or foods, such as toast or cereal.  6 hours before the procedure - stop drinking milk or drinks that contain milk.  2 hours before the procedure - stop drinking clear liquids. Medicine Ask your health care provider about:  Changing or stopping your regular medicines. This is especially important if you are taking diabetes medicines or blood thinners.  Taking medicines such as aspirin and ibuprofen. These medicines can thin your blood. Do not take these medicines before your procedure if your health care provider instructs you not to.  Tests and exams  You will have a physical exam.  You may have blood tests done to show: ? How well your kidneys and liver are working. ? How well your blood can clot. General instructions  Plan to have someone take you home from the hospital or clinic.  If you will be going home right after the procedure, plan to have someone with you for 24 hours. What happens during the procedure?  An IV tube will be inserted into  one of your veins.  Medicine to help you relax (sedative) will be given through the IV tube.  The medical or dental procedure will be performed. What happens after the procedure?  Your blood pressure, heart rate, breathing rate, and blood oxygen level will be monitored often until the medicines you were given have worn off.  Do not drive for 24 hours. This information is not intended to replace advice given to you by your health care provider. Make sure you discuss any questions you have with your health care provider. Document Revised: 11/08/2017 Document Reviewed: 03/17/2016 Elsevier Patient Education  2020 [employer].

## 2020-10-07 NOTE — Procedures (Signed)
Interventional Radiology Procedure Note  Procedure: Placement of a tunneled abdominal peritoneal drain Complications: None Recommendations:  - Ok to use - Do not submerge - Routine care  - dc 1 hr  Signed,  Dulcy Fanny. Earleen Newport, DO

## 2020-10-11 ENCOUNTER — Other Ambulatory Visit: Payer: Self-pay | Admitting: Hematology & Oncology

## 2020-10-11 ENCOUNTER — Encounter (HOSPITAL_COMMUNITY): Payer: Self-pay

## 2020-10-11 DIAGNOSIS — D3A098 Benign carcinoid tumors of other sites: Secondary | ICD-10-CM

## 2020-10-13 ENCOUNTER — Other Ambulatory Visit: Payer: Self-pay | Admitting: *Deleted

## 2020-10-13 ENCOUNTER — Telehealth: Payer: Self-pay | Admitting: *Deleted

## 2020-10-13 DIAGNOSIS — R188 Other ascites: Secondary | ICD-10-CM

## 2020-10-13 DIAGNOSIS — R180 Malignant ascites: Secondary | ICD-10-CM | POA: Diagnosis not present

## 2020-10-13 DIAGNOSIS — E8809 Other disorders of plasma-protein metabolism, not elsewhere classified: Secondary | ICD-10-CM

## 2020-10-13 DIAGNOSIS — D3A098 Benign carcinoid tumors of other sites: Secondary | ICD-10-CM | POA: Diagnosis not present

## 2020-10-13 DIAGNOSIS — C259 Malignant neoplasm of pancreas, unspecified: Secondary | ICD-10-CM

## 2020-10-13 NOTE — Telephone Encounter (Signed)
Call received this morning from patient's wife stating that they have not heard from a [facility] nurse as of today to assist with pleurx catheter drain.  Informed pt.s wife that I would check on [facility] and call her back.  Order placed to [facility] per Dr. Marin Olp.  Call received from [facility] at [facility] stating that a nurse could come out to see patient this Saturday and that she would notify patient's wife.  Call placed to patient's wife to notify her that Jenny Reichmann from [facility] would contact her today regarding nurse visit.  Patient's wife is appreciative of assistance and states that patient is in no distress at this time and has no further questions.

## 2020-10-14 ENCOUNTER — Ambulatory Visit: Payer: Medicare HMO | Admitting: Hematology & Oncology

## 2020-10-14 ENCOUNTER — Ambulatory Visit: Payer: Medicare HMO

## 2020-10-14 ENCOUNTER — Other Ambulatory Visit: Payer: Medicare HMO

## 2020-10-15 DIAGNOSIS — C7889 Secondary malignant neoplasm of other digestive organs: Secondary | ICD-10-CM | POA: Diagnosis not present

## 2020-10-15 DIAGNOSIS — H9190 Unspecified hearing loss, unspecified ear: Secondary | ICD-10-CM | POA: Diagnosis not present

## 2020-10-15 DIAGNOSIS — I119 Hypertensive heart disease without heart failure: Secondary | ICD-10-CM | POA: Diagnosis not present

## 2020-10-15 DIAGNOSIS — M109 Gout, unspecified: Secondary | ICD-10-CM | POA: Diagnosis not present

## 2020-10-15 DIAGNOSIS — Z4801 Encounter for change or removal of surgical wound dressing: Secondary | ICD-10-CM | POA: Diagnosis not present

## 2020-10-15 DIAGNOSIS — E7800 Pure hypercholesterolemia, unspecified: Secondary | ICD-10-CM | POA: Diagnosis not present

## 2020-10-15 DIAGNOSIS — I2510 Atherosclerotic heart disease of native coronary artery without angina pectoris: Secondary | ICD-10-CM | POA: Diagnosis not present

## 2020-10-15 DIAGNOSIS — E1121 Type 2 diabetes mellitus with diabetic nephropathy: Secondary | ICD-10-CM | POA: Diagnosis not present

## 2020-10-15 DIAGNOSIS — C7B02 Secondary carcinoid tumors of liver: Secondary | ICD-10-CM | POA: Diagnosis not present

## 2020-10-15 DIAGNOSIS — D3A098 Benign carcinoid tumors of other sites: Secondary | ICD-10-CM | POA: Diagnosis not present

## 2020-10-18 ENCOUNTER — Telehealth: Payer: Self-pay | Admitting: Student

## 2020-10-18 ENCOUNTER — Telehealth: Payer: Self-pay | Admitting: *Deleted

## 2020-10-18 ENCOUNTER — Other Ambulatory Visit (HOSPITAL_COMMUNITY)
Admission: RE | Admit: 2020-10-18 | Discharge: 2020-10-18 | Disposition: A | Discharge status: Home / Self Care | Payer: Medicare HMO | Source: Ambulatory Visit | Attending: Gastroenterology | Admitting: Gastroenterology

## 2020-10-18 DIAGNOSIS — D3A098 Benign carcinoid tumors of other sites: Secondary | ICD-10-CM | POA: Diagnosis not present

## 2020-10-18 DIAGNOSIS — E1121 Type 2 diabetes mellitus with diabetic nephropathy: Secondary | ICD-10-CM | POA: Diagnosis not present

## 2020-10-18 DIAGNOSIS — I119 Hypertensive heart disease without heart failure: Secondary | ICD-10-CM | POA: Diagnosis not present

## 2020-10-18 DIAGNOSIS — Z4801 Encounter for change or removal of surgical wound dressing: Secondary | ICD-10-CM | POA: Diagnosis not present

## 2020-10-18 DIAGNOSIS — M109 Gout, unspecified: Secondary | ICD-10-CM | POA: Diagnosis not present

## 2020-10-18 DIAGNOSIS — C7B02 Secondary carcinoid tumors of liver: Secondary | ICD-10-CM | POA: Diagnosis not present

## 2020-10-18 DIAGNOSIS — Z01818 Encounter for other preprocedural examination: Secondary | ICD-10-CM | POA: Diagnosis not present

## 2020-10-18 DIAGNOSIS — Z20822 Contact with and (suspected) exposure to covid-19: Secondary | ICD-10-CM | POA: Insufficient documentation

## 2020-10-18 DIAGNOSIS — E7800 Pure hypercholesterolemia, unspecified: Secondary | ICD-10-CM | POA: Diagnosis not present

## 2020-10-18 DIAGNOSIS — H9190 Unspecified hearing loss, unspecified ear: Secondary | ICD-10-CM | POA: Diagnosis not present

## 2020-10-18 DIAGNOSIS — I2510 Atherosclerotic heart disease of native coronary artery without angina pectoris: Secondary | ICD-10-CM | POA: Diagnosis not present

## 2020-10-18 LAB — SARS CORONAVIRUS 2 (TAT 6-24 HRS): SARS Coronavirus 2: NEGATIVE

## 2020-10-18 NOTE — Telephone Encounter (Signed)
IR.  Received call from Inez Catalina, RN at [facility] regarding patient leaking from recent paracentesis site. She states patient had an abdominal pleurX placed in IR 10/07/2020, most recent paracentesis 09/23/2020. She states pleurX is working, however patient has been leaking from recent paracentesis site (on opposite side of abdomen)- requesting recommendations. Explained that it is too far out to place surgical glue. Recommend wound care management- dressing changes, ostomy bag placement. Recommend continued use of pleruX. Offered to bring patient in for evaluation, however per RN patient currently quarantining for upcoming procedure.  All questions answered and concerns addressed.   Bea Graff Lyrick Lagrand, PA-C 10/18/2020, 3:56 PM

## 2020-10-18 NOTE — Telephone Encounter (Signed)
Inez Catalina, home health nurse from [place] called and stated,"the pleural site from the last paracentesis, next to the pleurex catheter, is draining copious amounts of fluid." I told her that Dr. Marin Olp was out of the office this afternoon. She stated,"I called and talked to the NP at Interventional Radiology and she gave me an order to put a colostomy bag over the site." I told her I would give this message to Dr. Marin Olp. Betty's phone number is [phone].

## 2020-10-19 ENCOUNTER — Other Ambulatory Visit (HOSPITAL_COMMUNITY): Payer: Self-pay | Admitting: Radiology

## 2020-10-19 ENCOUNTER — Telehealth: Payer: Self-pay | Admitting: *Deleted

## 2020-10-19 ENCOUNTER — Other Ambulatory Visit: Payer: Self-pay

## 2020-10-19 ENCOUNTER — Ambulatory Visit (HOSPITAL_COMMUNITY)
Admission: RE | Admit: 2020-10-19 | Discharge: 2020-10-19 | Disposition: A | Discharge status: Home / Self Care | Payer: Medicare HMO | Source: Ambulatory Visit | Attending: Radiology | Admitting: Radiology

## 2020-10-19 DIAGNOSIS — R188 Other ascites: Secondary | ICD-10-CM

## 2020-10-19 DIAGNOSIS — D3A098 Benign carcinoid tumors of other sites: Secondary | ICD-10-CM | POA: Diagnosis not present

## 2020-10-19 HISTORY — PX: IR PARACENTESIS: IMG2679

## 2020-10-19 NOTE — Telephone Encounter (Signed)
Call received from patient's wife stating that pt has a "small hole in his skin on his abdomen to the right side of his pleurx catheter that is draining large amounts of fluid."  Dr. Marin Olp notified.  Call placed to IR at [facility] and order received for pt to come in today at 11:30AM to be assessed for dermabond per K. Allred PA.  Pt.'s wife notified and states that they will be at [facility] IR at 11:30AM.

## 2020-10-19 NOTE — Procedures (Signed)
Approximately 2.7 L of hazy yellow fluid were removed today from patient's right lower abdominal tunneled peritoneal drain.  Dermabond and Steri-Strips were applied to leaking dermatotomy site below drain entry point.  New gauze dressings applied over area. EBL none.

## 2020-10-20 ENCOUNTER — Encounter (HOSPITAL_COMMUNITY): Payer: Self-pay | Admitting: Gastroenterology

## 2020-10-20 DIAGNOSIS — M109 Gout, unspecified: Secondary | ICD-10-CM | POA: Diagnosis not present

## 2020-10-20 DIAGNOSIS — C7B02 Secondary carcinoid tumors of liver: Secondary | ICD-10-CM | POA: Diagnosis not present

## 2020-10-20 DIAGNOSIS — E7800 Pure hypercholesterolemia, unspecified: Secondary | ICD-10-CM | POA: Diagnosis not present

## 2020-10-20 DIAGNOSIS — D3A098 Benign carcinoid tumors of other sites: Secondary | ICD-10-CM | POA: Diagnosis not present

## 2020-10-20 DIAGNOSIS — I119 Hypertensive heart disease without heart failure: Secondary | ICD-10-CM | POA: Diagnosis not present

## 2020-10-20 DIAGNOSIS — I2510 Atherosclerotic heart disease of native coronary artery without angina pectoris: Secondary | ICD-10-CM | POA: Diagnosis not present

## 2020-10-20 DIAGNOSIS — E1121 Type 2 diabetes mellitus with diabetic nephropathy: Secondary | ICD-10-CM | POA: Diagnosis not present

## 2020-10-20 DIAGNOSIS — Z4801 Encounter for change or removal of surgical wound dressing: Secondary | ICD-10-CM | POA: Diagnosis not present

## 2020-10-20 DIAGNOSIS — H9190 Unspecified hearing loss, unspecified ear: Secondary | ICD-10-CM | POA: Diagnosis not present

## 2020-10-21 ENCOUNTER — Ambulatory Visit (HOSPITAL_COMMUNITY): Payer: Medicare HMO | Admitting: Certified Registered Nurse Anesthetist

## 2020-10-21 ENCOUNTER — Other Ambulatory Visit: Payer: Self-pay

## 2020-10-21 ENCOUNTER — Ambulatory Visit (HOSPITAL_COMMUNITY)
Admission: RE | Admit: 2020-10-21 | Discharge: 2020-10-21 | Disposition: A | Discharge status: Home / Self Care | Payer: Medicare HMO | Attending: Gastroenterology | Admitting: Gastroenterology

## 2020-10-21 ENCOUNTER — Encounter (HOSPITAL_COMMUNITY)
Admission: RE | Disposition: A | Discharge status: Home / Self Care | Payer: Self-pay | Source: Home / Self Care | Attending: Gastroenterology

## 2020-10-21 ENCOUNTER — Other Ambulatory Visit: Payer: Self-pay | Admitting: Physician Assistant

## 2020-10-21 ENCOUNTER — Encounter (HOSPITAL_COMMUNITY): Payer: Self-pay | Admitting: Gastroenterology

## 2020-10-21 DIAGNOSIS — K3189 Other diseases of stomach and duodenum: Secondary | ICD-10-CM | POA: Diagnosis not present

## 2020-10-21 DIAGNOSIS — K209 Esophagitis, unspecified without bleeding: Secondary | ICD-10-CM | POA: Insufficient documentation

## 2020-10-21 DIAGNOSIS — I8500 Esophageal varices without bleeding: Secondary | ICD-10-CM | POA: Diagnosis not present

## 2020-10-21 DIAGNOSIS — C7A8 Other malignant neuroendocrine tumors: Secondary | ICD-10-CM | POA: Diagnosis not present

## 2020-10-21 DIAGNOSIS — I8510 Secondary esophageal varices without bleeding: Secondary | ICD-10-CM | POA: Diagnosis not present

## 2020-10-21 DIAGNOSIS — Z87891 Personal history of nicotine dependence: Secondary | ICD-10-CM | POA: Diagnosis not present

## 2020-10-21 DIAGNOSIS — K766 Portal hypertension: Secondary | ICD-10-CM | POA: Diagnosis not present

## 2020-10-21 DIAGNOSIS — R935 Abnormal findings on diagnostic imaging of other abdominal regions, including retroperitoneum: Secondary | ICD-10-CM | POA: Diagnosis present

## 2020-10-21 DIAGNOSIS — D3A098 Benign carcinoid tumors of other sites: Secondary | ICD-10-CM

## 2020-10-21 DIAGNOSIS — K449 Diaphragmatic hernia without obstruction or gangrene: Secondary | ICD-10-CM | POA: Insufficient documentation

## 2020-10-21 DIAGNOSIS — D378 Neoplasm of uncertain behavior of other specified digestive organs: Secondary | ICD-10-CM

## 2020-10-21 DIAGNOSIS — C787 Secondary malignant neoplasm of liver and intrahepatic bile duct: Secondary | ICD-10-CM | POA: Diagnosis not present

## 2020-10-21 DIAGNOSIS — K2950 Unspecified chronic gastritis without bleeding: Secondary | ICD-10-CM | POA: Insufficient documentation

## 2020-10-21 DIAGNOSIS — K869 Disease of pancreas, unspecified: Secondary | ICD-10-CM

## 2020-10-21 DIAGNOSIS — D490 Neoplasm of unspecified behavior of digestive system: Secondary | ICD-10-CM | POA: Diagnosis not present

## 2020-10-21 HISTORY — PX: BIOPSY: SHX5522

## 2020-10-21 HISTORY — PX: EUS: SHX5427

## 2020-10-21 HISTORY — PX: FINE NEEDLE ASPIRATION: SHX5430

## 2020-10-21 HISTORY — PX: ESOPHAGOGASTRODUODENOSCOPY (EGD) WITH PROPOFOL: SHX5813

## 2020-10-21 SURGERY — ESOPHAGOGASTRODUODENOSCOPY (EGD) WITH PROPOFOL
Anesthesia: Monitor Anesthesia Care

## 2020-10-21 MED ORDER — PROPOFOL 500 MG/50ML IV EMUL
INTRAVENOUS | Status: DC | PRN
  Administered 2020-10-21: 125 ug/kg/min via INTRAVENOUS

## 2020-10-21 MED ORDER — PHENYLEPHRINE 40 MCG/ML (10ML) SYRINGE FOR IV PUSH (FOR BLOOD PRESSURE SUPPORT)
PREFILLED_SYRINGE | INTRAVENOUS | Status: DC | PRN
  Administered 2020-10-21 (×2): 80 ug via INTRAVENOUS

## 2020-10-21 MED ORDER — LIDOCAINE 2% (20 MG/ML) 5 ML SYRINGE
INTRAMUSCULAR | Status: DC | PRN
  Administered 2020-10-21: 60 mg via INTRAVENOUS

## 2020-10-21 MED ORDER — PROPOFOL 10 MG/ML IV BOLUS
INTRAVENOUS | Status: DC | PRN
  Administered 2020-10-21: 20 mg via INTRAVENOUS

## 2020-10-21 MED ORDER — LACTATED RINGERS IV SOLN: INTRAVENOUS | Status: DC

## 2020-10-21 MED ORDER — OMEPRAZOLE 20 MG PO CPDR: 20.0000 mg | DELAYED_RELEASE_CAPSULE | Freq: Two times a day (BID) | ORAL | 3 refills | Status: AC

## 2020-10-21 MED ORDER — SODIUM CHLORIDE 0.9 % IV SOLN: INTRAVENOUS | Status: DC

## 2020-10-21 NOTE — Discharge Instructions (Signed)
YOU HAD AN ENDOSCOPIC PROCEDURE TODAY: Refer to the procedure report and other information in the discharge instructions given to you for any specific questions about what was found during the examination. If this information does not answer your questions, please call [facility] office at [phone] to clarify.  ° °YOU SHOULD EXPECT: Some feelings of bloating in the abdomen. Passage of more gas than usual. Walking can help get rid of the air that was put into your GI tract during the procedure and reduce the bloating. If you had a lower endoscopy (such as a colonoscopy or flexible sigmoidoscopy) you may notice spotting of blood in your stool or on the toilet paper. Some abdominal soreness may be present for a day or two, also. ° °DIET: Your first meal following the procedure should be a light meal and then it is ok to progress to your normal diet. A half-sandwich or bowl of soup is an example of a good first meal. Heavy or fried foods are harder to digest and may make you feel nauseous or bloated. Drink plenty of fluids but you should avoid alcoholic beverages for 24 hours. If you had a esophageal dilation, please see attached instructions for diet.   ° °ACTIVITY: Your care partner should take you home directly after the procedure. You should plan to take it easy, moving slowly for the rest of the day. You can resume normal activity the day after the procedure however YOU SHOULD NOT DRIVE, use power tools, machinery or perform tasks that involve climbing or major physical exertion for 24 hours (because of the sedation medicines used during the test).  ° °SYMPTOMS TO REPORT IMMEDIATELY: °A gastroenterologist can be reached at any hour. Please call [phone]  for any of the following symptoms:  °Following lower endoscopy (colonoscopy, flexible sigmoidoscopy) °Excessive amounts of blood in the stool  °Significant tenderness, worsening of abdominal pains  °Swelling of the abdomen that is new, acute  °Fever of 100° or  higher  °Following upper endoscopy (EGD, EUS, ERCP, esophageal dilation) °Vomiting of blood or coffee ground material  °New, significant abdominal pain  °New, significant chest pain or pain under the shoulder blades  °Painful or persistently difficult swallowing  °New shortness of breath  °Black, tarry-looking or red, bloody stools ° °FOLLOW UP:  °If any biopsies were taken you will be contacted by phone or by letter within the next 1-3 weeks. Call [phone]  if you have not heard about the biopsies in 3 weeks.  °Please also call with any specific questions about appointments or follow up tests. ° °

## 2020-10-21 NOTE — Anesthesia Postprocedure Evaluation (Signed)
Anesthesia Post Note  Patient: Bruce Gray  Procedure(s) Performed: UPPER ENDOSCOPIC ULTRASOUND (EUS) RADIAL (N/A ) ESOPHAGOGASTRODUODENOSCOPY (EGD) WITH PROPOFOL (N/A ) FINE NEEDLE ASPIRATION (FNA) LINEAR BIOPSY     Patient location during evaluation: Endoscopy Anesthesia Type: MAC Level of consciousness: awake and alert Pain management: pain level controlled Vital Signs Assessment: post-procedure vital signs reviewed and stable Respiratory status: spontaneous breathing, nonlabored ventilation and respiratory function stable Cardiovascular status: blood pressure returned to baseline and stable Postop Assessment: no apparent nausea or vomiting Anesthetic complications: no   No complications documented.  Last Vitals:  Vitals:   10/21/20 1020 10/21/20 1030  BP: (!) 112/54 (!) 127/56  Pulse: 62 (!) 59  Resp: (!) 34 (!) 22  Temp:    SpO2: 97% 98%    Last Pain:  Vitals:   10/21/20 1030  TempSrc:   PainSc: 0-No pain                 Lynda Rainwater

## 2020-10-21 NOTE — Anesthesia Preprocedure Evaluation (Signed)
Anesthesia Evaluation  Patient identified by MRN, date of birth, ID band Patient awake    Reviewed: Allergy & Precautions, H&P , NPO status , Patient's Chart, lab work & pertinent test results  Airway Mallampati: II   Neck ROM: full    Dental   Pulmonary sleep apnea , former smoker,    breath sounds clear to auscultation       Cardiovascular hypertension, + CAD and + CABG   Rhythm:regular Rate:Normal     Neuro/Psych    GI/Hepatic   Endo/Other  diabetes, Type 2  Renal/GU Renal InsufficiencyRenal disease     Musculoskeletal   Abdominal   Peds  Hematology   Anesthesia Other Findings   Reproductive/Obstetrics                             Anesthesia Physical Anesthesia Plan  ASA: III  Anesthesia Plan: MAC   Post-op Pain Management:    Induction: Intravenous  PONV Risk Score and Plan: 1 and Propofol infusion and Treatment may vary due to age or medical condition  Airway Management Planned: Nasal Cannula  Additional Equipment:   Intra-op Plan:   Post-operative Plan:   Informed Consent: I have reviewed the patients History and Physical, chart, labs and discussed the procedure including the risks, benefits and alternatives for the proposed anesthesia with the patient or authorized representative who has indicated his/her understanding and acceptance.       Plan Discussed with: CRNA, Anesthesiologist and Surgeon  Anesthesia Plan Comments:         Anesthesia Quick Evaluation

## 2020-10-21 NOTE — Transfer of Care (Signed)
Immediate Anesthesia Transfer of Care Note  Patient: Bruce Gray  Procedure(s) Performed: UPPER ENDOSCOPIC ULTRASOUND (EUS) RADIAL (N/A ) ESOPHAGOGASTRODUODENOSCOPY (EGD) WITH PROPOFOL (N/A ) FINE NEEDLE ASPIRATION (FNA) LINEAR BIOPSY  Patient Location: Endoscopy Unit  Anesthesia Type:MAC  Level of Consciousness: drowsy and patient cooperative  Airway & Oxygen Therapy: Patient Spontanous Breathing and Patient connected to nasal cannula oxygen  Post-op Assessment: Report given to RN and Post -op Vital signs reviewed and stable  Post vital signs: Reviewed and stable  Last Vitals:  Vitals Value Taken Time  BP 102/68   Temp    Pulse 65 10/21/20 1007  Resp 13 10/21/20 1007  SpO2 100 % 10/21/20 1007  Vitals shown include unvalidated device data.  Last Pain:  Vitals:   10/21/20 0805  TempSrc: Oral  PainSc: 0-No pain         Complications: No complications documented.

## 2020-10-21 NOTE — H&P (Signed)
GASTROENTEROLOGY PROCEDURE H&P NOTE   Primary Care Physician: Aretta Nip, MD  HPI: Bruce Gray is a 79 y.o. male who presents for EGD/EUS with attempt at FNB of pancreatic mass to confirm that it is NET rather than Adenocarcinoma (all pathology consistent with metastatic NET but per Oncology is not acting as such - with abnormal findings on PET-CT as well).  Past Medical History:  Diagnosis Date  . CAD (coronary artery disease)   . 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 Complex sleep apnea syndrome    does not use a cpap-cannot  . 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 Gout   . Heart disease   . High cholesterol   . HOH (hard of hearing)   . Hypertension   . Liver disease   . Mild renal insufficiency   . 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 CARDIAC CATHETERIZATION  86,2010  . CARDIOVASCULAR STRESS TEST  2008  . CHOLECYSTECTOMY  2011  . COLONOSCOPY    . CORONARY ARTERY BYPASS GRAFT  1986  . DOPPLER ECHOCARDIOGRAPHY     mild concentric LVH with mild grade 1 diastolic dysfunction with normal tissue doppler parameters  . HEMORRHOID SURGERY  2007  . INSERTION PROSTATE RADIATION SEED  2013  . IR PARACENTESIS  08/16/2020  . IR PARACENTESIS  09/09/2020  . IR PARACENTESIS  09/23/2020  . IR PARACENTESIS  10/19/2020  . IR PERC TUN PERIT CATH WO PORT S&I Dartha Lodge  10/07/2020  . IR RADIOLOGIST EVAL & MGMT  07/20/2020  . KNEE ARTHROSCOPY WITH MEDIAL MENISECTOMY Left 08/20/2014   Procedure: LEFT KNEE ARTHROSCOPY WITH DEBRIDEMENT/SHAVING (CHONDROPLASTY), MEDIAL MENISECTOMY, EXCISION OF PLICA;  Surgeon: Ninetta Lights, MD;  Location: [facility];  Service: Orthopedics;  Laterality: Left;  . TONSILLECTOMY     Current Facility-Administered Medications  Medication Dose Route Frequency Provider Last Rate Last Admin  . 0.9 %  sodium chloride infusion   Intravenous  Continuous Mansouraty, Telford Nab., MD       Allergies  Allergen Reactions  . Codeine Nausea And Vomiting  . Lansoprazole Diarrhea and Nausea And Vomiting   Family History  Problem Relation Age of Onset  . Cancer - Other Mother        breast  . Stroke Father    Social History   Socioeconomic History  . Marital status: Married    Spouse name: Psychologist, occupational  . Number of children: 2  . Years of education: 20  . Highest education level: Not on file  Occupational History  . Occupation: retired  Tobacco Use  . Smoking status: Former Smoker    Quit date: 07/03/1958    Years since quitting: 62.3  . Smokeless tobacco: Never Used  Vaping Use  . Vaping Use: Never used  Substance and Sexual Activity  . Alcohol use: No  . Drug use: No  . Sexual activity: Not on file  Other Topics Concern  . Not on file  Social History Narrative  . Not on file   Social Determinants of Health   Financial Resource Strain:   . Difficulty of Paying Living Expenses: Not on file  Food Insecurity:   . Worried About Charity fundraiser in the Last Year: Not on file  . Ran Out of Food in the Last Year: Not on file  Transportation Needs:   . Lack  of Transportation (Medical): Not on file  . Lack of Transportation (Non-Medical): Not on file  Physical Activity:   . Days of Exercise per Week: Not on file  . Minutes of Exercise per Session: Not on file  Stress:   . Feeling of Stress : Not on file  Social Connections:   . Frequency of Communication with Friends and Family: Not on file  . Frequency of Social Gatherings with Friends and Family: Not on file  . Attends Religious Services: Not on file  . Active Member of Clubs or Organizations: Not on file  . Attends Archivist Meetings: Not on file  . Marital Status: Not on file  Intimate Partner Violence:   . Fear of Current or Ex-Partner: Not on file  . Emotionally Abused: Not on file  . Physically Abused: Not on file  . Sexually Abused: Not  on file    Physical Exam: Vital signs in last 24 hours:     GEN: NAD EYE: Sclerae anicteric ENT: MMM CV: Non-tachycardic GI: Soft, NT/ND NEURO:  Alert & Oriented x 3  Lab Results: No results for input(s): WBC, HGB, HCT, PLT in the last 72 hours. BMET No results for input(s): NA, K, CL, CO2, GLUCOSE, BUN, CREATININE, CALCIUM in the last 72 hours. LFT No results for input(s): PROT, ALBUMIN, AST, ALT, ALKPHOS, BILITOT, BILIDIR, IBILI in the last 72 hours. PT/INR No results for input(s): LABPROT, INR in the last 72 hours.   Impression / Plan: This is a 79 y.o.male who presents for EGD/EUS with attempt at FNB of pancreatic mass to confirm that it is NET rather than Adenocarcinoma (all pathology consistent with metastatic NET but per Oncology is not acting as such - with abnormal findings on PET-CT as well).  The risks of an EUS including intestinal perforation, bleeding, infection, aspiration, and medication effects were discussed as was the possibility it may not give a definitive diagnosis if a biopsy is performed.  When a biopsy of the pancreas is done as part of the EUS, there is an additional risk of pancreatitis at the rate of about 1-2%.  It was explained that procedure related pancreatitis is typically mild, although it can be severe and even life threatening, which is why we do not perform random pancreatic biopsies and only biopsy a lesion/area we feel is concerning enough to warrant the risk.  The risks and benefits of endoscopic evaluation were discussed with the patient; these include but are not limited to the risk of perforation, infection, bleeding, missed lesions, lack of diagnosis, severe illness requiring hospitalization, as well as anesthesia and sedation related illnesses.  The patient is agreeable to proceed.    Justice Britain, MD [facility] Office # [phone]

## 2020-10-21 NOTE — Op Note (Signed)
[facility] Patient Name: Bruce Gray Procedure Date : 10/21/2020 MRN: [id number] Attending MD: Justice Britain , MD Date of Birth: 24-Sep-1941 CSN: [id number] Age: 79 Admit Type: Inpatient Procedure:                Upper EUS Indications:              Abnormal abdominal PET scan, Pancreatic Gastrinoma Providers:                Justice Britain, MD, Baird Cancer, RN, Cletis Athens, Technician Referring MD:             Rudell Cobb. Marin Olp MD, MD, Wonda Horner, MD,                            Arbyrd Rankins Medicines:                Monitored Anesthesia Care Complications:            No immediate complications. Estimated Blood Loss:     Estimated blood loss was minimal. Procedure:                Pre-Anesthesia Assessment:                           - Prior to the procedure, a History and Physical                            was performed, and patient medications and                            allergies were reviewed. The patient's tolerance of                            previous anesthesia was also reviewed. The risks                            and benefits of the procedure and the sedation                            options and risks were discussed with the patient.                            All questions were answered, and informed consent                            was obtained. Prior Anticoagulants: The patient has                            taken no previous anticoagulant or antiplatelet                            agents. ASA Grade Assessment: III - A patient with  severe systemic disease. After reviewing the risks                            and benefits, the patient was deemed in                            satisfactory condition to undergo the procedure.                           After obtaining informed consent, the endoscope was                            passed under direct vision. Throughout the                             procedure, the patient's blood pressure, pulse, and                            oxygen saturations were monitored continuously. The                            [id number]-[id number] ([id number]) Olympus gastroscope was                            introduced through the mouth, and advanced to the                            second part of duodenum. The [id number]-[id number] ([id number])                            Olympus duodenoscope was introduced through the                            mouth, and advanced to the second part of duodenum.                            The [id number] ([id number]) Linear was introduced                            through the mouth, and advanced to the duodenum for                            ultrasound examination from the stomach and                            duodenum. The upper EUS was accomplished without                            difficulty. The patient tolerated the procedure. Scope In: Scope Out: Findings:      ENDOSCOPIC FINDING: :      No gross lesions were noted in the proximal esophagus and in the mid       esophagus.      Grade I, grade II varices were found in the distal  esophagus.      LA Grade B (one or more mucosal breaks greater than 5 mm, not extending       between the tops of two mucosal folds) esophagitis with no bleeding was       found in the distal esophagus.      A 3 cm hiatal hernia was present.      Patchy moderate inflammation characterized by erosions, erythema and       granularity was found at the incisura, in the gastric antrum and in the       prepyloric region of the stomach.      Mild portal hypertensive gastropathy was found in the entire examined       stomach.      No gross lesions were noted in the entire examined stomach. Biopsies       were taken with a cold forceps for histology and Helicobacter pylori       testing.      No gross lesions were noted in the duodenal bulb, in the first portion       of the duodenum and in the second portion  of the duodenum.      The ampulla was normal.      ENDOSONOGRAPHIC FINDING: :      An irregular mass was identified in the pancreatic tail. The mass was       hypoechoic. The mass measured 27 mm by 23 mm in maximal cross-sectional       diameter. The outer margins were irregular. There was sonographic       evidence suggesting invasion into the splenic artery (manifested by       invasion) as well as splenic collaterals being present. Fine needle       biopsy was performed. Color Doppler imaging was utilized prior to needle       puncture to confirm a lack of significant vascular structures within the       needle path. Seven passes were made with the 22 gauge ultrasound core       biopsy needle using a transgastric approach (The first 3 slides did not       have enough alcohol in case so there is chance they were not going to       have adequacy - thus reason for increased biopsies more than normal).       Visible cores of tissue were obtained. Preliminary cytologic examination       and touch preps were performed. Final cytology results are pending.      Pancreatic parenchymal abnormalities were noted in the entire pancreas.       These consisted of lobularity without honeycombing and hyperechoic       strands.      The pancreatic duct was normal throughout (PDH - 1.2 mm, PDN - 1.5 mm,       PDB - 0.5 mm, PDT - 0.5 mm) though within the pancreatic tail it was not       greatly seen due to the mass noted above.      There was no sign of significant endosonographic abnormality in the       common bile duct (2.6 mm) and in the common hepatic duct (4.7 mm). No       stones, no biliary sludge and ducts of normal caliber were identified.      Endosonographic imaging of the ampulla showed no extrinsic compression,  intramural (subepithelial) lesion, mass, varices or wall thickening.      An irregular mass was identified endosonographically in the visualized       portion of the liver. The  mass was heterogenous - consistent with known       metastatic disease.      The celiac region was visualized. Impression:               EGD Impression:                           - No gross lesions in esophagus proximally. 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 LA Grade                            B esophagitis with no bleeding distally.                           - 3 cm hiatal hernia.                           - Gastritis. Portal hypertensive gastropathy. No                            other gross lesions in the stomach. Biopsied.                           - No gross lesions in the duodenal bulb, in the                            first portion of the duodenum and in the second                            portion of the duodenum.                           - Normal ampulla.                           EUS Impression:                           - A mass was identified in the pancreatic tail.                            Fine needle biopsy performed to rule out that this                            is not an adenocarcinoma as it has been theorized                            as the primary Neuroendocrine tumor for this  individual.                           - Pancreatic parenchymal abnormalities consisting                            of lobularity and hyperechoic strands were noted in                            the entire pancreas - Not consistent with diagnosis                            of chronic pancreatitis.                           - There was no sign of significant pathology in the                            common bile duct and in the common hepatic duct.                           - A mass was found in the visualized portion of the                            liver. A tissue diagnosis was obtained prior to                            this exam. This is of a metastatic neuroendocrine                            tumor. Recommendation:            - The patient will be observed post-procedure,                            until all discharge criteria are met.                           - Discharge patient to home.                           - Patient has a contact number available for                            emergencies. The signs and symptoms of potential                            delayed complications were discussed with the                            patient. Return to normal activities tomorrow.                            Written discharge instructions were provided to the  patient.                           - Low fat diet for 1 week.                           - Observe patient's clinical course.                           - Await cytology results and await path results.                           - Monitor for signs/symptoms of pancreatitis,                            bleeding, perforation, and infection. If issues                            please call our number to get further assistance as                            needed.                           - Recommend increasing PPI to twice daily while                            awaiting biopsies.                           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 Recommend that he follow up with his                            primary GI team ([facility] and whomever will be                            taking over Dr. Estell Harpin patients) for further                            titration of diuretics as needed. Also needs                            consideration of betablockade in setting of his                            portal hypertension changes and varices in future.                           - The findings and recommendations were discussed                            with the patient.                           -  The findings and recommendations were discussed                            with the  patient's family. Procedure Code(s):        --- Professional ---                           [phone], Esophagogastroduodenoscopy, flexible,                            transoral; with transendoscopic ultrasound-guided                            intramural or transmural fine needle                            aspiration/biopsy(s), (includes endoscopic                            ultrasound examination limited to the esophagus,                            stomach or duodenum, and adjacent structures) Diagnosis Code(s):        --- Professional ---                           I85.00, Esophageal varices without bleeding                           K20.90, Esophagitis, unspecified without bleeding                           K44.9, Diaphragmatic hernia without obstruction or                            gangrene                           K29.70, Gastritis, unspecified, without bleeding                           K76.6, Portal hypertension                           K31.89, Other diseases of stomach and duodenum                           K86.89, Other specified diseases of pancreas                           K86.9, Disease of pancreas, unspecified                           C78.7, Secondary malignant neoplasm of liver and                            intrahepatic bile duct  C7A.8, Other malignant neuroendocrine tumors                           D37.8, Neoplasm of uncertain behavior of other                            specified digestive organs                           R93.5, Abnormal findings on diagnostic imaging of                            other abdominal regions, including retroperitoneum CPT copyright 2019 [employer]. All rights reserved. The codes documented in this report are preliminary and upon coder review may  be revised to meet current compliance requirements. Justice Britain, MD 10/21/2020 10:36:36 AM Number of Addenda: 0

## 2020-10-24 ENCOUNTER — Other Ambulatory Visit: Payer: Self-pay

## 2020-10-24 ENCOUNTER — Other Ambulatory Visit: Payer: Self-pay | Admitting: Radiology

## 2020-10-24 ENCOUNTER — Encounter: Payer: Self-pay | Admitting: Gastroenterology

## 2020-10-24 ENCOUNTER — Ambulatory Visit (HOSPITAL_COMMUNITY)
Admission: RE | Admit: 2020-10-24 | Discharge: 2020-10-24 | Disposition: A | Discharge status: Home / Self Care | Payer: Medicare HMO | Source: Ambulatory Visit | Attending: Radiology | Admitting: Radiology

## 2020-10-24 ENCOUNTER — Encounter (HOSPITAL_COMMUNITY): Payer: Self-pay | Admitting: Gastroenterology

## 2020-10-24 DIAGNOSIS — D3A098 Benign carcinoid tumors of other sites: Secondary | ICD-10-CM

## 2020-10-24 DIAGNOSIS — R188 Other ascites: Secondary | ICD-10-CM | POA: Diagnosis not present

## 2020-10-24 LAB — SURGICAL PATHOLOGY

## 2020-10-24 MED ORDER — LIDOCAINE HCL 1 % IJ SOLN
INTRAMUSCULAR | Status: AC
  Filled 2020-10-24: qty 20

## 2020-10-24 NOTE — Procedures (Signed)
Patient reporting leaking around the pleurex catheter exit site. No issues with the previously leaking dermatotomy site. Ultrasound-guided  therapeutic paracentesis  Via the patient's right sided peritoneal pleurex catheter performed yielding 1.9 liters of amber colored fluid.. No immediate complications. EBL is none. New dressing applied.  Recommend patient performed paracentesis daily until dry  X 1 week to allow for exit site to heal. This was communicated to the patient who verbalized understanding and is in agreement with the plan of care.

## 2020-10-25 LAB — CYTOLOGY - NON PAP

## 2020-10-26 DIAGNOSIS — D3A098 Benign carcinoid tumors of other sites: Secondary | ICD-10-CM | POA: Diagnosis not present

## 2020-10-26 DIAGNOSIS — E7800 Pure hypercholesterolemia, unspecified: Secondary | ICD-10-CM | POA: Diagnosis not present

## 2020-10-26 DIAGNOSIS — H9190 Unspecified hearing loss, unspecified ear: Secondary | ICD-10-CM | POA: Diagnosis not present

## 2020-10-26 DIAGNOSIS — C7B02 Secondary carcinoid tumors of liver: Secondary | ICD-10-CM | POA: Diagnosis not present

## 2020-10-26 DIAGNOSIS — I119 Hypertensive heart disease without heart failure: Secondary | ICD-10-CM | POA: Diagnosis not present

## 2020-10-26 DIAGNOSIS — M109 Gout, unspecified: Secondary | ICD-10-CM | POA: Diagnosis not present

## 2020-10-26 DIAGNOSIS — Z4801 Encounter for change or removal of surgical wound dressing: Secondary | ICD-10-CM | POA: Diagnosis not present

## 2020-10-26 DIAGNOSIS — I2510 Atherosclerotic heart disease of native coronary artery without angina pectoris: Secondary | ICD-10-CM | POA: Diagnosis not present

## 2020-10-26 DIAGNOSIS — E1121 Type 2 diabetes mellitus with diabetic nephropathy: Secondary | ICD-10-CM | POA: Diagnosis not present

## 2020-11-01 ENCOUNTER — Telehealth: Payer: Self-pay

## 2020-11-01 ENCOUNTER — Inpatient Hospital Stay (HOSPITAL_BASED_OUTPATIENT_CLINIC_OR_DEPARTMENT_OTHER): Payer: Medicare HMO | Admitting: Hematology & Oncology

## 2020-11-01 ENCOUNTER — Inpatient Hospital Stay: Payer: Medicare HMO

## 2020-11-01 ENCOUNTER — Telehealth: Payer: Self-pay | Admitting: *Deleted

## 2020-11-01 ENCOUNTER — Encounter: Payer: Self-pay | Admitting: Hematology & Oncology

## 2020-11-01 ENCOUNTER — Inpatient Hospital Stay: Payer: Medicare HMO | Attending: Hematology & Oncology

## 2020-11-01 ENCOUNTER — Other Ambulatory Visit: Payer: Self-pay

## 2020-11-01 VITALS — BP 96/57 | HR 82 | Temp 97.7°F | Resp 16 | Wt 109.0 lb

## 2020-11-01 DIAGNOSIS — R188 Other ascites: Secondary | ICD-10-CM | POA: Insufficient documentation

## 2020-11-01 DIAGNOSIS — E340 Carcinoid syndrome: Secondary | ICD-10-CM | POA: Diagnosis not present

## 2020-11-01 DIAGNOSIS — D3A098 Benign carcinoid tumors of other sites: Secondary | ICD-10-CM

## 2020-11-01 DIAGNOSIS — C7A098 Malignant carcinoid tumors of other sites: Secondary | ICD-10-CM | POA: Diagnosis not present

## 2020-11-01 DIAGNOSIS — K766 Portal hypertension: Secondary | ICD-10-CM | POA: Diagnosis not present

## 2020-11-01 DIAGNOSIS — C7B02 Secondary carcinoid tumors of liver: Secondary | ICD-10-CM | POA: Insufficient documentation

## 2020-11-01 LAB — CMP (CANCER CENTER ONLY)
ALT: 24 U/L (ref 0–44)
AST: 37 U/L (ref 15–41)
Albumin: 3.4 g/dL — ABNORMAL LOW (ref 3.5–5.0)
Alkaline Phosphatase: 259 U/L — ABNORMAL HIGH (ref 38–126)
Anion gap: 11 (ref 5–15)
BUN: 65 mg/dL — ABNORMAL HIGH (ref 8–23)
CO2: 21 mmol/L — ABNORMAL LOW (ref 22–32)
Calcium: 9.6 mg/dL (ref 8.9–10.3)
Chloride: 101 mmol/L (ref 98–111)
Creatinine: 2.12 mg/dL — ABNORMAL HIGH (ref 0.61–1.24)
GFR, Estimated: 31 mL/min — ABNORMAL LOW (ref >60)
Glucose, Bld: 124 mg/dL — ABNORMAL HIGH (ref 70–99)
Potassium: 5.1 mmol/L (ref 3.5–5.1)
Sodium: 133 mmol/L — ABNORMAL LOW (ref 135–145)
Total Bilirubin: 1 mg/dL (ref 0.3–1.2)
Total Protein: 6.5 g/dL (ref 6.5–8.1)

## 2020-11-01 LAB — CBC WITH DIFFERENTIAL (CANCER CENTER ONLY)
Abs Immature Granulocytes: 0.24 10*3/uL — ABNORMAL HIGH (ref 0.00–0.07)
Basophils Absolute: 0 10*3/uL (ref 0.0–0.1)
Basophils Relative: 0 %
Eosinophils Absolute: 0 10*3/uL (ref 0.0–0.5)
Eosinophils Relative: 0 %
HCT: 44.1 % (ref 39.0–52.0)
Hemoglobin: 15 g/dL (ref 13.0–17.0)
Immature Granulocytes: 1 %
Lymphocytes Relative: 9 %
Lymphs Abs: 1.7 10*3/uL (ref 0.7–4.0)
MCH: 34.1 pg — ABNORMAL HIGH (ref 26.0–34.0)
MCHC: 34 g/dL (ref 30.0–36.0)
MCV: 100.2 fL — ABNORMAL HIGH (ref 80.0–100.0)
Monocytes Absolute: 2.6 10*3/uL — ABNORMAL HIGH (ref 0.1–1.0)
Monocytes Relative: 14 %
Neutro Abs: 13.9 10*3/uL — ABNORMAL HIGH (ref 1.7–7.7)
Neutrophils Relative %: 76 %
Platelet Count: 359 10*3/uL (ref 150–400)
RBC: 4.4 MIL/uL (ref 4.22–5.81)
RDW: 13.9 % (ref 11.5–15.5)
WBC Count: 18.5 10*3/uL — ABNORMAL HIGH (ref 4.0–10.5)
nRBC: 0 % (ref 0.0–0.2)

## 2020-11-01 LAB — PREALBUMIN: Prealbumin: 10 mg/dL — ABNORMAL LOW (ref 18–38)

## 2020-11-01 LAB — LACTATE DEHYDROGENASE: LDH: 145 U/L (ref 98–192)

## 2020-11-01 NOTE — Progress Notes (Signed)
Hematology and Oncology Follow Up Visit  Bruce Gray [id number] 1941-11-03 79 y.o. 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  Unfortunately, he continues to decline.  His weight is now down to 109 pounds.  No matter what we have done, he still has progressed.  I am not sure if this is all from the carcinoid.  I suspect that he just has poor liver function.  Is not like he has cirrhosis or liver failure.  He just has this refractory ascites.  He now has a peritoneal catheter in.  His wife opens up every day to drain it.  Thankfully, the drainage has become a little bit less right now.  He is tired.  He is more confused.  He has less stamina.  Everything is pointing to the fact that his body is not handling this situation at all.  I really think that the Somatuline needs to be discontinued.  It just is not helping [place].  His last chromogranin A level was almost 1900.  We actually did do a biopsy of the pancreas to make sure that there is no other malignancy.  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  I am unsure as to why he just has not responded to Somatuline.  He is not a candidate for any additional therapy.  I had a long talk with he and his wife today.  We talked about comfort care and end-of-life issues.  I really think we need to get Hospice involved.  We will see about helping them out with Hospice.  I really think this would be a good idea.  I just want to make sure that he has some quality of life.  Hopefully, Hospice will be able to help with this.  I did discuss with him his desire for life support.  He DOES NOT want to be kept alive on a machine.  He has a very  strong faith.  As such, he is a DO NOT RESUSCITATE which I totally agree with.  I just think that his body weight is gone down so much that his heart is not can be able to handle the stress of such a low body weight.  His kidneys are slowly failing him.  His appetite comes and goes.  I really do not think he is going to eat all that much.  I must say this is been an incredibly frustrating situation which he has had a tumor that people should live with for years and yet he has had no response and yet has declined relatively quickly.  Thankfully, there is no problems with pain.  He has had no problems with bowel or bladder incontinence.  There is no diarrhea.  He has had no cough or shortness of breath.  There is no bleeding.  Overall, I would say his performance status is probably ECOG 3 at best.    Medications:  Current Outpatient Medications:  .  acetaminophen (TYLENOL) 325 MG tablet, Take 650 mg by mouth every 6 (six) hours as needed for moderate pain or headache., Disp: , Rfl:  .  docusate sodium (COLACE) 100 MG capsule, Take 100 mg by mouth  daily as needed for mild constipation. , Disp: , Rfl:  .  FLUAD QUADRIVALENT 0.5 ML injection, , Disp: , Rfl:  .  lanreotide acetate (SOMATULINE DEPOT) 120 MG/0.5ML injection, Inject 120 mg into the skin every 14 (fourteen) days. Inject as a deep subcutaneous injection in the superior external quadrant of the buttock. Alternate injection site between the right and left sides. (Patient taking differently: Inject 120 mg into the skin every 28 (twenty-eight) days. Inject as a deep subcutaneous injection in the superior external quadrant of the buttock. Alternate injection site between the right and left sides.), Disp: 0.5 mL, Rfl: 4 .  omeprazole (PRILOSEC) 20 MG capsule, Take 1 capsule (20 mg total) by mouth 2 (two) times daily before a meal., Disp: 60 capsule, Rfl: 3 .  traMADol (ULTRAM) 50 MG tablet, Take 1 tablet (50 mg total) by mouth every 6 (six)  hours as needed for moderate pain (every 8 hours)., Disp: 30 tablet, Rfl: 1  Allergies:  Allergies  Allergen Reactions  . Codeine Nausea And Vomiting  . Lansoprazole Diarrhea and Nausea And Vomiting    Past Medical History, Surgical history, Social history, and Family History were reviewed and updated.  Review of Systems: Review of Systems  Constitutional: Negative.   HENT:  Negative.   Eyes: Negative.   Respiratory: Negative.   Cardiovascular: Negative.   Gastrointestinal: Positive for diarrhea.  Endocrine: Negative.   Genitourinary: Negative.    Musculoskeletal: Negative.   Skin: Negative.   Neurological: Negative.   Hematological: Negative.   Psychiatric/Behavioral: Negative.     Physical Exam:  weight is 109 lb (49.4 kg). His oral temperature is 97.7 F (36.5 C). His blood pressure is 96/57 (abnormal) and his pulse is 82. His respiration is 16 and oxygen saturation is 99%.   Wt Readings from Last 3 Encounters:  11/01/20 109 lb (49.4 kg)  10/21/20 118 lb (53.5 kg)  10/07/20 124 lb 12.8 oz (56.6 kg)    Physical Exam Vitals reviewed.  Constitutional:      Comments: This is a very thin almost cachectic white male.  His abdomen is not as distended.  He has muscle atrophy in upper and lower extremities.  He has temporal muscle wasting.  HENT:     Head: Normocephalic and atraumatic.  Eyes:     Pupils: Pupils are equal, round, and reactive to light.  Cardiovascular:     Rate and Rhythm: Normal rate and regular rhythm.     Heart sounds: Normal heart sounds.  Pulmonary:     Effort: Pulmonary effort is normal.     Breath sounds: Normal breath sounds.  Abdominal:     General: Bowel sounds are normal.     Palpations: Abdomen is soft.     Comments: His abdomen is not as distended.  There is a small fluid wave.  There is no guarding or rebound tenderness.  There is no obvious hepatosplenomegaly.  Musculoskeletal:        General: No tenderness or deformity. Normal range  of motion.     Cervical back: Normal range of motion.     Comments: Muscles in upper and lower extremities show atrophy that is symmetric.  He has decent range of motion.  He has decent strength although decreased symmetrically.  Lymphadenopathy:     Cervical: No cervical adenopathy.  Skin:    General: Skin is warm and dry.     Findings: No erythema or rash.  Neurological:     Mental Status: He is  alert and oriented to person, place, and time.  Psychiatric:        Behavior: Behavior normal.        Thought Content: Thought content normal.        Judgment: Judgment normal.    Lab Results  Component Value Date   WBC 18.5 (H) 11/01/2020   HGB 15.0 11/01/2020   HCT 44.1 11/01/2020   MCV 100.2 (H) 11/01/2020   PLT 359 11/01/2020     Chemistry      Component Value Date/Time   NA 133 (L) 11/01/2020 1326   NA 139 02/03/2018 0843   K 5.1 11/01/2020 1326   CL 101 11/01/2020 1326   CO2 21 (L) 11/01/2020 1326   BUN 65 (H) 11/01/2020 1326   BUN 22 02/03/2018 0843   CREATININE 2.12 (H) 11/01/2020 1326      Component Value Date/Time   CALCIUM 9.6 11/01/2020 1326   ALKPHOS 259 (H) 11/01/2020 1326   AST 37 11/01/2020 1326   ALT 24 11/01/2020 1326   BILITOT 1.0 11/01/2020 1326       Impression and Plan: Mr. Montminy is a very nice 79 year old white male.  He has metastatic carcinoid.  By the pathology, this is a well differentiated tumor but it is certainly not behaving that way.  At this point, we have to really focus on his quality of life and end-of-life issues.  Again I want him to have some quality of life.  I am glad that the peritoneal catheter is in place so that the fluid can be drained at home.  I think that Hospice will really help out.  He wants to stay at home.  His wife is very helpful and she really has done a fantastic job helping to take care of him.  I just hate the fact that he has not responded to treatment.  We had a very good prayer at the end of our meeting.  His  faith remains incredibly strong.  He is not worried about passing on because he knows where he is going.  He knows that up in [place] he will be healed.  I would like to see him back in about 3 weeks if possible.  It certainly would not surprise me if he cannot make the appointment at that time.     Volanda Napoleon, MD 11/23/20212:20 PM

## 2020-11-01 NOTE — Telephone Encounter (Signed)
No 11/01/20 LOS entered.... AOM °

## 2020-11-01 NOTE — Progress Notes (Signed)
..  The following Assist/Replace Program for Somatuline [place] from [employer] has been terminated due to Dr. Marin Olp discontinuing medication.  Last DOS:10/03/2020

## 2020-11-01 NOTE — Telephone Encounter (Signed)
Hospice referral called to [facility] per order of Dr. Ennever.  

## 2020-11-02 ENCOUNTER — Encounter: Payer: Self-pay | Admitting: *Deleted

## 2020-11-02 DIAGNOSIS — I119 Hypertensive heart disease without heart failure: Secondary | ICD-10-CM | POA: Diagnosis not present

## 2020-11-02 DIAGNOSIS — I2510 Atherosclerotic heart disease of native coronary artery without angina pectoris: Secondary | ICD-10-CM | POA: Diagnosis not present

## 2020-11-02 DIAGNOSIS — E1121 Type 2 diabetes mellitus with diabetic nephropathy: Secondary | ICD-10-CM | POA: Diagnosis not present

## 2020-11-02 DIAGNOSIS — D3A098 Benign carcinoid tumors of other sites: Secondary | ICD-10-CM | POA: Diagnosis not present

## 2020-11-02 DIAGNOSIS — M109 Gout, unspecified: Secondary | ICD-10-CM | POA: Diagnosis not present

## 2020-11-02 DIAGNOSIS — Z4801 Encounter for change or removal of surgical wound dressing: Secondary | ICD-10-CM | POA: Diagnosis not present

## 2020-11-02 DIAGNOSIS — E7800 Pure hypercholesterolemia, unspecified: Secondary | ICD-10-CM | POA: Diagnosis not present

## 2020-11-02 DIAGNOSIS — C7B02 Secondary carcinoid tumors of liver: Secondary | ICD-10-CM | POA: Diagnosis not present

## 2020-11-02 DIAGNOSIS — H9190 Unspecified hearing loss, unspecified ear: Secondary | ICD-10-CM | POA: Diagnosis not present

## 2020-11-02 LAB — CHROMOGRANIN A: Chromogranin A (ng/mL): 3595 ng/mL — ABNORMAL HIGH (ref 0.0–101.8)

## 2020-11-02 NOTE — Progress Notes (Signed)
Oncology Nurse Navigator Documentation  Oncology Nurse Navigator Flowsheets 11/02/2020  Abnormal Finding Date -  Confirmed Diagnosis Date -  Diagnosis Status -  Planned Course of Treatment -  Phase of Treatment -  Hormonal Therapy Pending- Reason: -  Hormonal Actual Start Date: -  Navigator Follow Up Date: 11/21/2020  Navigator Follow Up Reason: Follow-up Appointment  Navigator Location [facility]  Referral Date to RadOnc/MedOnc -  Navigator Encounter Type Appt/Treatment Plan Review  Telephone -  Treatment Initiated Date -  Patient Visit Type MedOnc  Treatment Phase Post-Tx Follow-up  Barriers/Navigation Needs Anxiety;Family Concerns;Morbidities/Frailty;Pain  Education -  Interventions -  Acuity Level 2-Minimal Needs (1-2 Barriers Identified)  Referrals -  Coordination of Care -  Education Method -  Support Groups/Services Friends and Family  Time Spent with Patient 15

## 2020-11-07 ENCOUNTER — Encounter: Payer: Self-pay | Admitting: *Deleted

## 2020-11-07 NOTE — Progress Notes (Signed)
Oncology Nurse Navigator Documentation  Oncology Nurse Navigator Flowsheets 11/07/2020  Abnormal Finding Date -  Confirmed Diagnosis Date -  Diagnosis Status -  Planned Course of Treatment -  Phase of Treatment -  Hormonal Therapy Pending- Reason: -  Hormonal Actual Start Date: -  Navigator Follow Up Date: 11/21/2020  Navigator Follow Up Reason: Follow-up Appointment  Navigator Location [facility]  Referral Date to RadOnc/MedOnc -  Navigator Encounter Type Telephone  Telephone Patient Update  Treatment Initiated Date -  Patient Visit Type MedOnc  Treatment Phase Post-Tx Follow-up  Barriers/Navigation Needs Anxiety;Family Concerns;Morbidities/Frailty;Pain  Education -  Interventions Psycho-Social Support  Acuity Level 2-Minimal Needs (1-2 Barriers Identified)  Referrals -  Coordination of Care -  Education Method -  Support Groups/Services Friends and Family  Time Spent with Patient 73

## 2020-11-08 ENCOUNTER — Other Ambulatory Visit: Payer: Self-pay | Admitting: *Deleted

## 2020-11-08 MED ORDER — TRAMADOL HCL 50 MG PO TABS: 50.0000 mg | ORAL_TABLET | Freq: Four times a day (QID) | ORAL | 1 refills | Status: AC | PRN

## 2020-11-11 ENCOUNTER — Other Ambulatory Visit: Payer: Self-pay

## 2020-11-11 MED ORDER — BACLOFEN 10 MG PO TABS: 10.0000 mg | ORAL_TABLET | Freq: Three times a day (TID) | ORAL | 0 refills | Status: AC

## 2020-11-14 ENCOUNTER — Telehealth: Payer: Self-pay | Admitting: Hematology & Oncology

## 2020-11-15 ENCOUNTER — Encounter: Payer: Self-pay | Admitting: *Deleted

## 2020-11-15 NOTE — Progress Notes (Signed)
Oncology Nurse Navigator Documentation  Oncology Nurse Navigator Flowsheets 11/15/2020  Abnormal Finding Date -  Confirmed Diagnosis Date -  Diagnosis Status -  Planned Course of Treatment -  Phase of Treatment -  Hormonal Therapy Pending- Reason: -  Hormonal Actual Start Date: -  Navigator Follow Up Date: -  Navigator Follow Up Reason: -  Navigation Complete Date: 11/15/2020  Post Navigation: Continue to Follow Patient? No  Reason Not Navigating Patient: Hospice/Death  Navigator Location [facility]  Referral Date to RadOnc/MedOnc -  Navigator Encounter Type Telephone  Telephone Outgoing Call  Treatment Initiated Date -  Patient Visit Type MedOnc  Treatment Phase Post-Tx Follow-up  Barriers/Navigation Needs -  Education -  Interventions Psycho-Social Support  Acuity Level 2-Minimal Needs (1-2 Barriers Identified)  Referrals -  Coordination of Care -  Education Method -  Support Groups/Services Friends and Family  Time Spent with Patient 30

## 2020-11-21 ENCOUNTER — Other Ambulatory Visit: Payer: Medicare HMO

## 2020-11-21 ENCOUNTER — Ambulatory Visit: Payer: Medicare HMO | Admitting: Hematology & Oncology

## 2020-12-10 NOTE — Telephone Encounter (Signed)
Patient's wife called and stated Bruce Gray passed away on December 03, 2020 @ 4:26AM

## 2020-12-10 DEATH — deceased

## 2020-12-18 IMAGING — US US PARACENTESIS
1 series · 6 of 6 positions shown · non-contrast
Comparison: none

INDICATION: Patient with history of metastatic carcinoid to the pancreas with
hepatic metastases, abdominal stent shin, and recurrent ascites.
Request is made for diagnostic and therapeutic paracentesis.

[Series 1: us paracentesis · 6 of 6 slices shown]
[im 1/6]
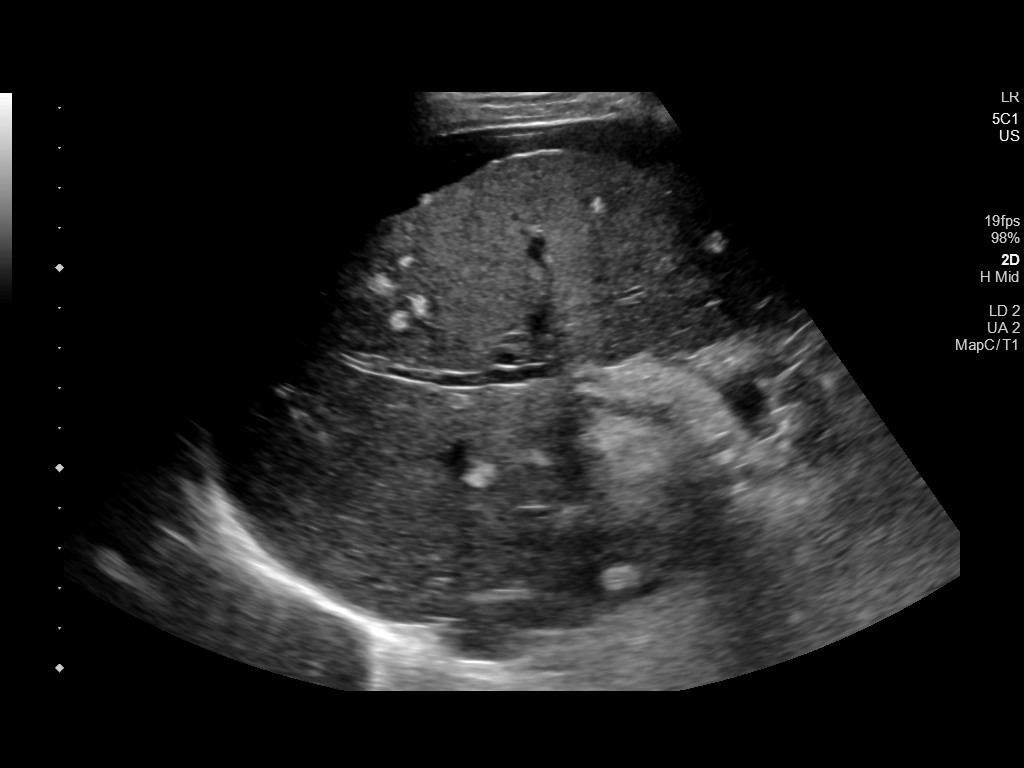
[im 2/6]
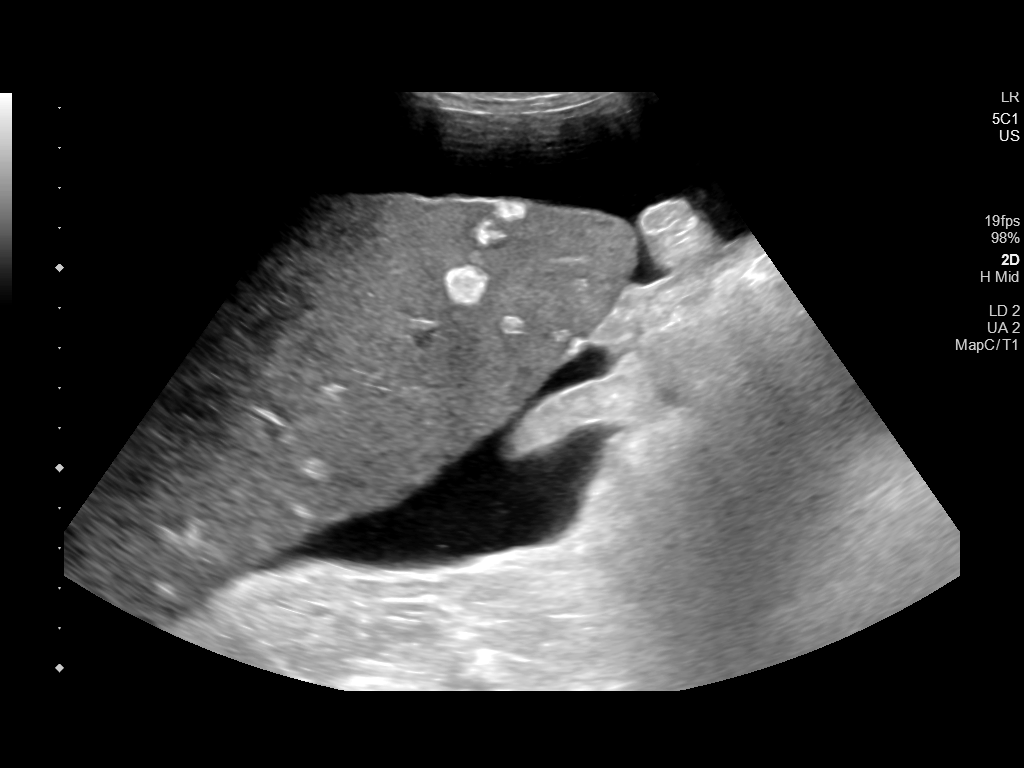
[im 3/6]
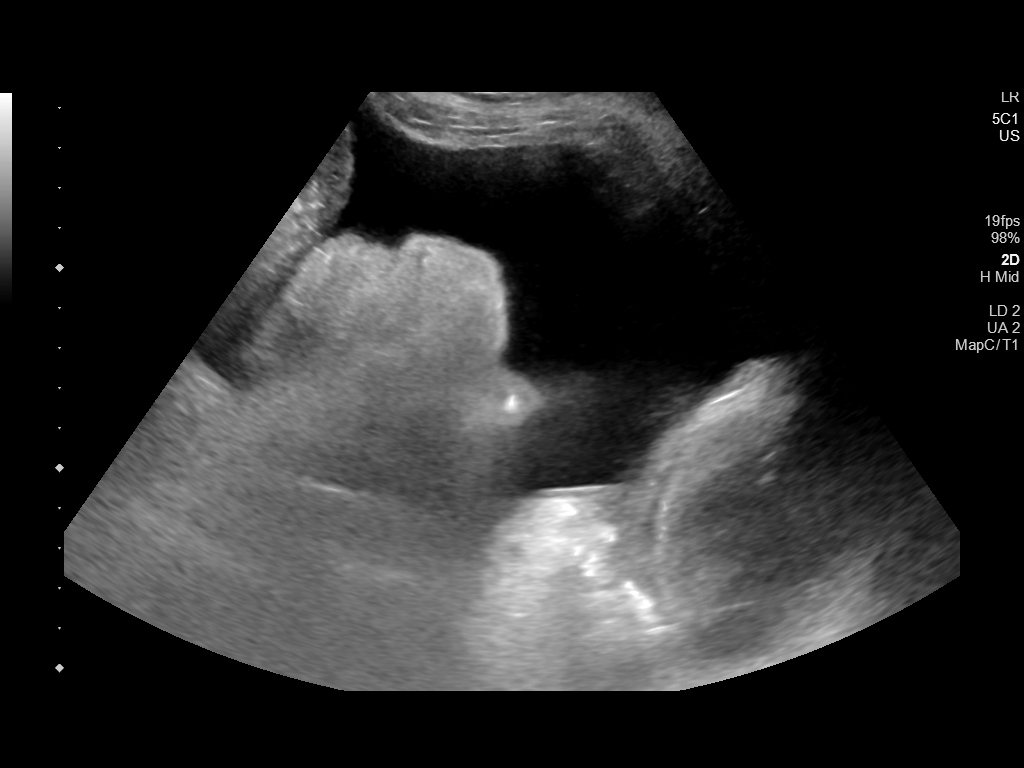
[im 4/6]
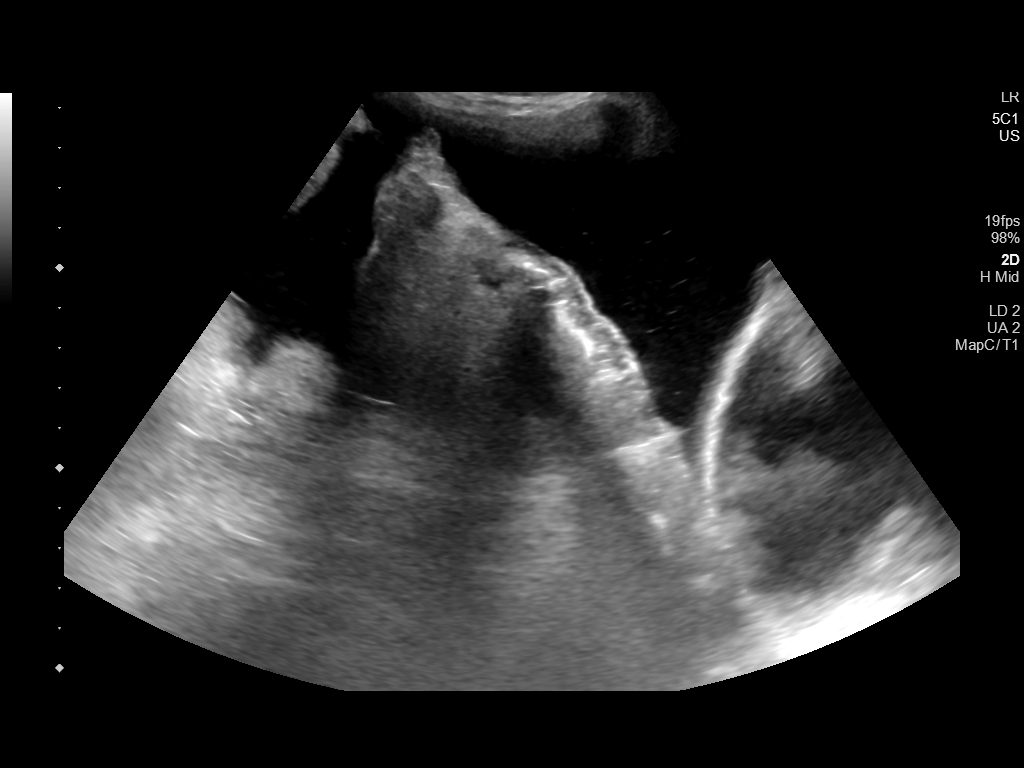
[im 5/6]
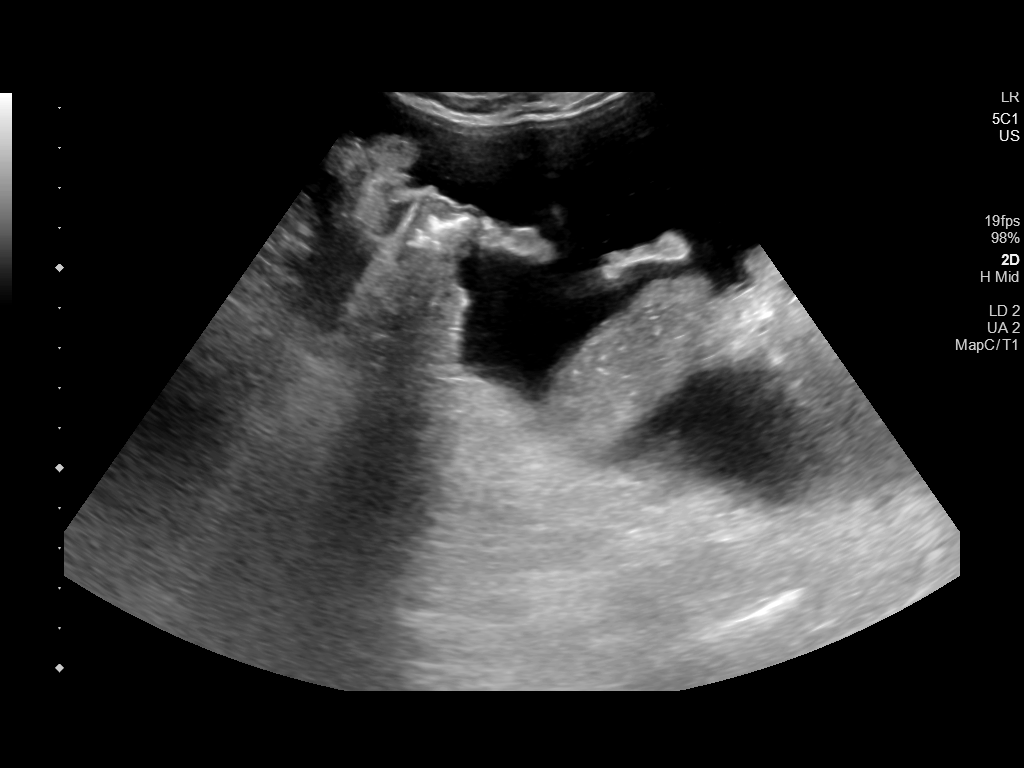
[im 6/6]
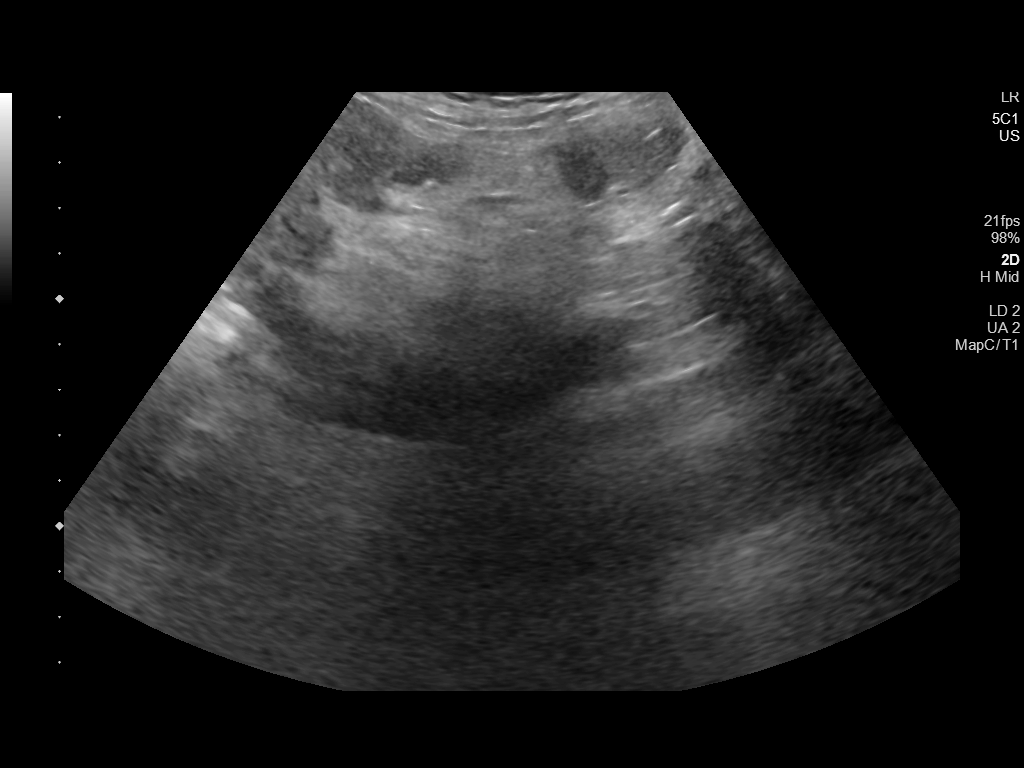

[6 of 6 positions shown; findings below may reference images not displayed]

EXAM:
ULTRASOUND GUIDED DIAGNOSTIC AND THERAPEUTIC PARACENTESIS

MEDICATIONS:
10 mL 1% lidocaine

COMPLICATIONS:
None immediate.

PROCEDURE:
Informed written consent was obtained from the patient after a
discussion of the risks, benefits and alternatives to treatment. A
timeout was performed prior to the initiation of the procedure.

Initial ultrasound scanning demonstrates a large amount of ascites
within the left lower abdominal quadrant. The left lower abdomen was
prepped and draped in the usual sterile fashion. 1% lidocaine was
used for local anesthesia.

Following this, a 19 gauge, 7-cm, Yueh catheter was introduced. An
ultrasound image was saved for documentation purposes. The
paracentesis was performed. The catheter was removed and a dressing
was applied. The patient tolerated the procedure well without
immediate post procedural complication.
FINDINGS: A total of approximately 5 L of hazy yellow fluid was removed.
Samples were sent to the laboratory as requested by the clinical
team.
IMPRESSION: Successful ultrasound-guided paracentesis yielding 5 L of peritoneal
fluid.

## 2021-01-09 IMAGING — US US PARACENTESIS
1 series · 13 of 25 positions shown · non-contrast
Comparison: none

INDICATION: Patient with pancreatic carcinoid carcinoma metastatic to the liver,
recurrent ascites. Request is made for therapeutic paracentesis.
Patient to receive albumin with procedure today.

[Series 1: us paracentesis · 13 of 26 slices shown]
[im 1/26]
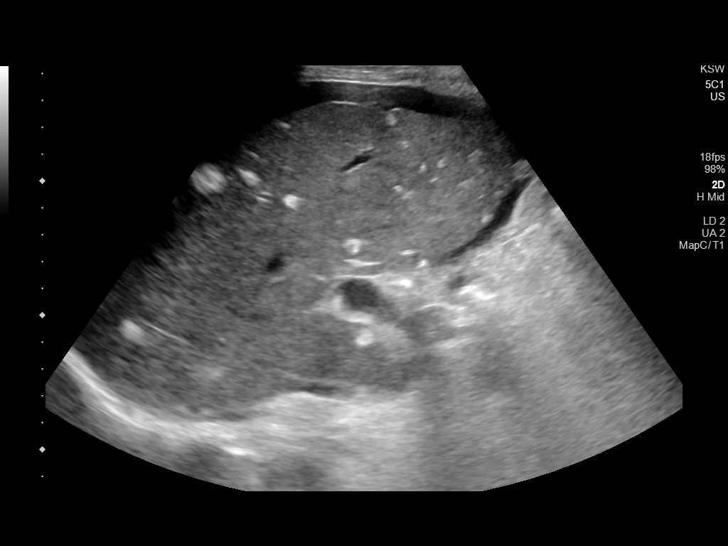
[im 3/26]
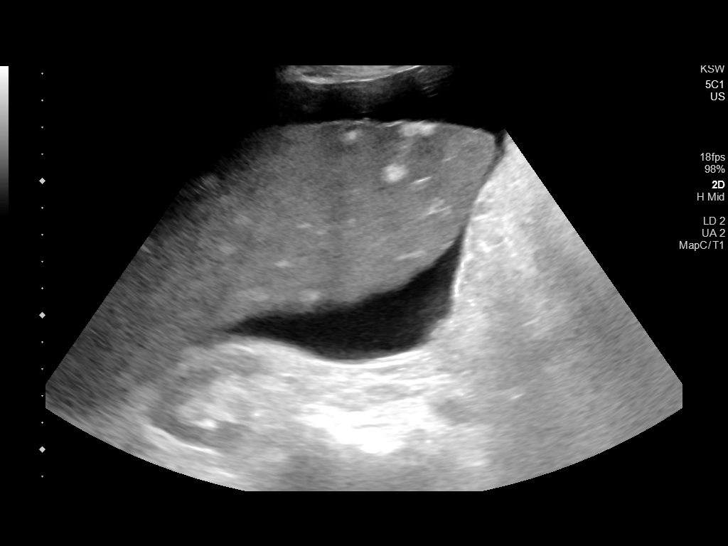
[im 5/26]
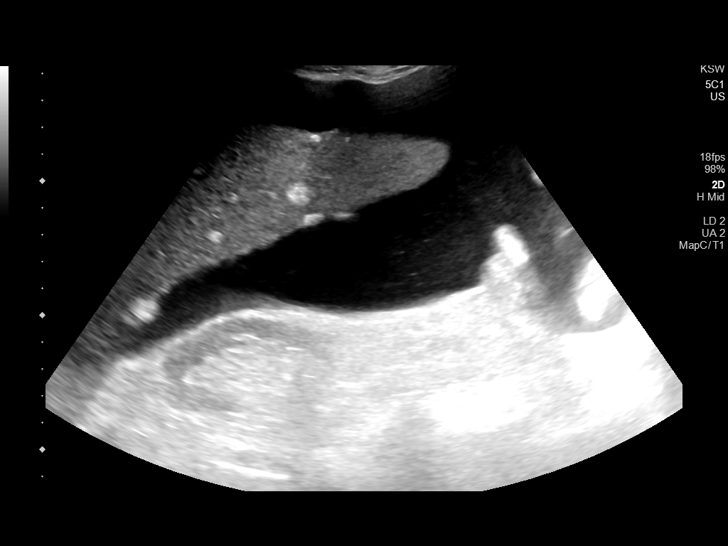
[im 7/26]
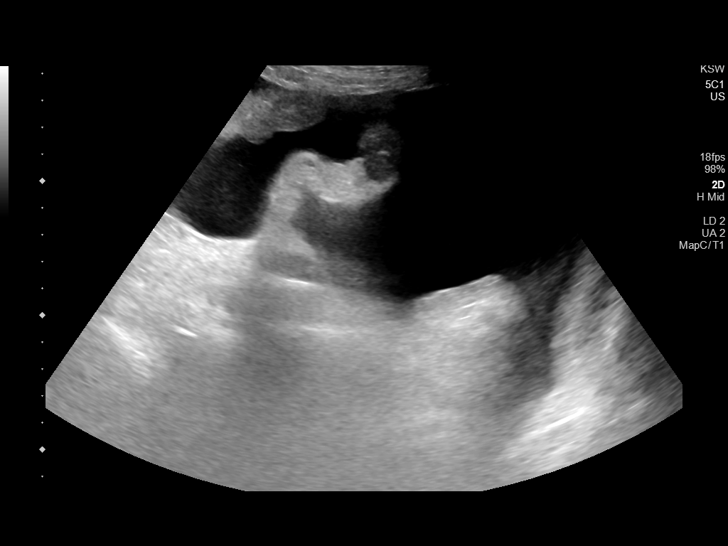
[im 9/26]
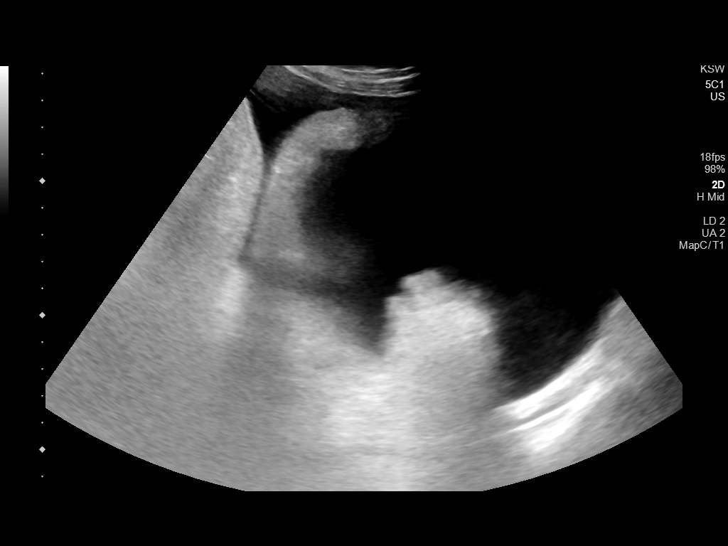
[im 11/26]
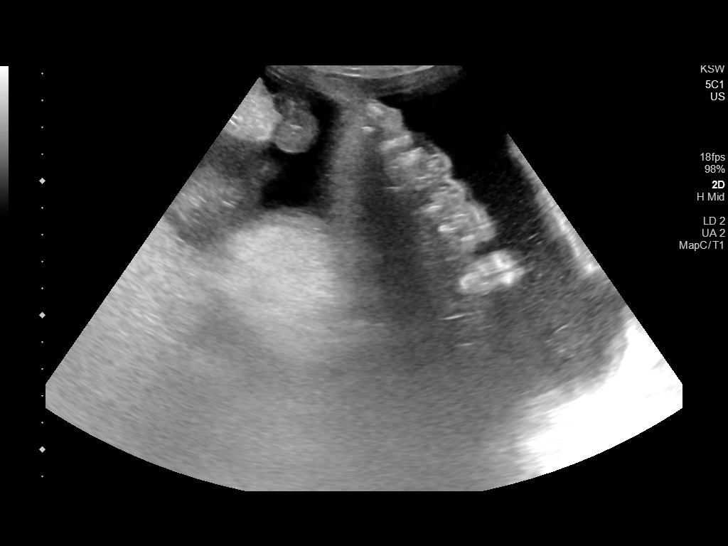
[im 13/26]
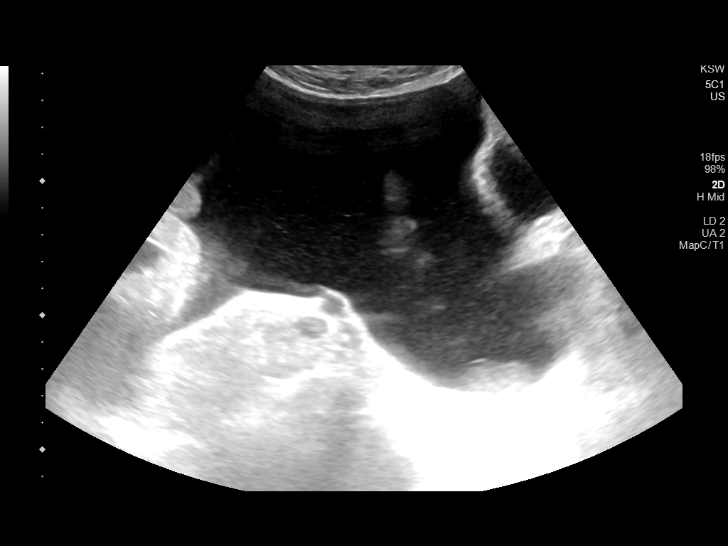
[im 15/26]
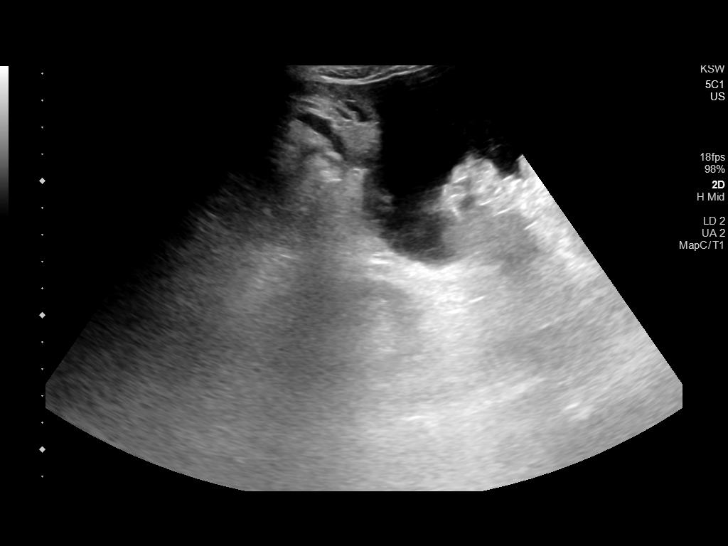
[im 17/26]
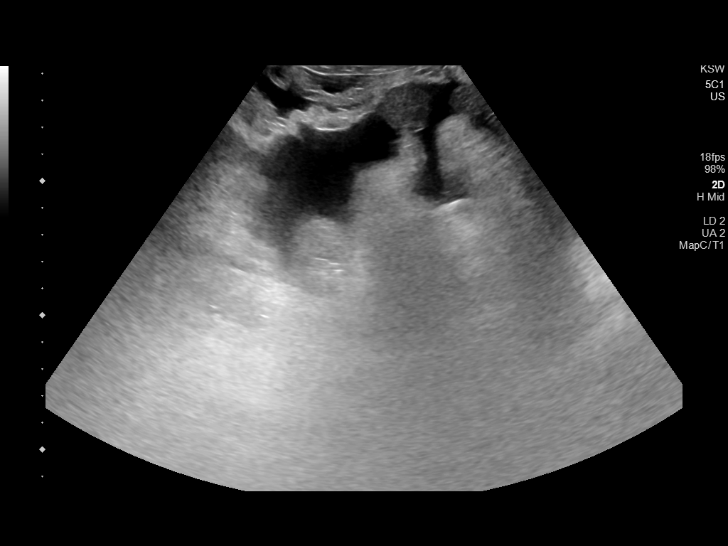
[im 19/26]
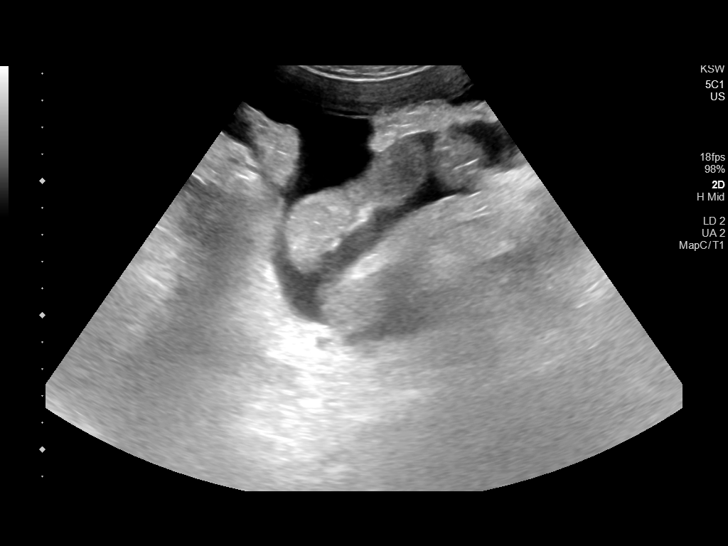
[im 21/26]
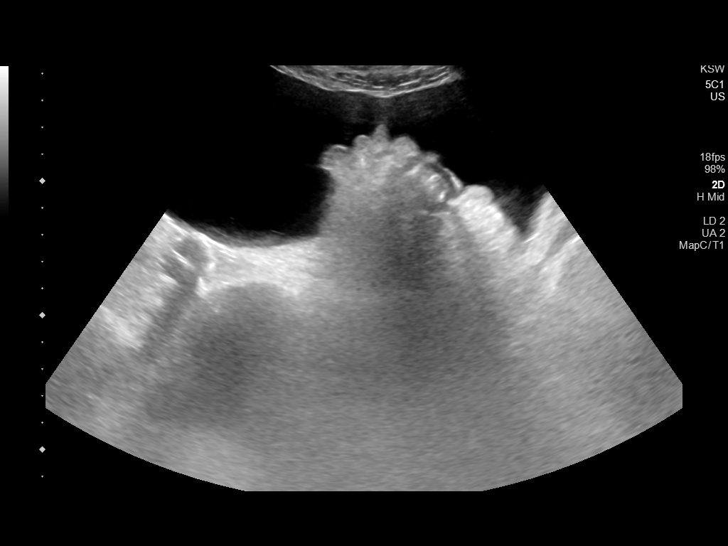
[im 23/26]
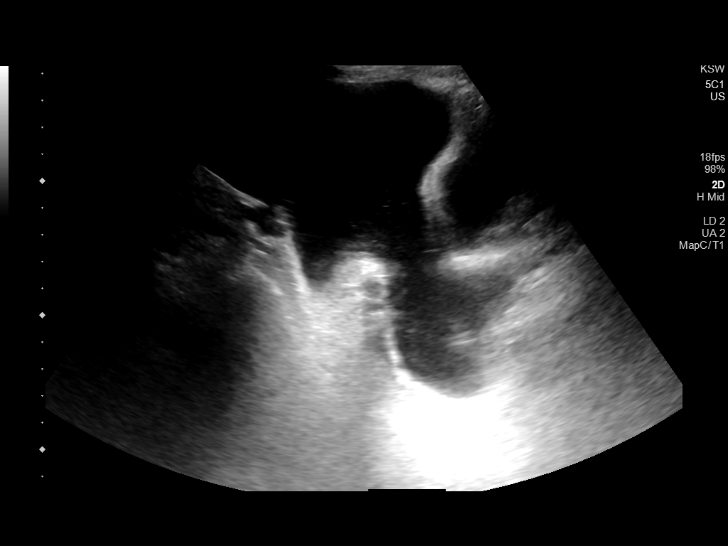
[im 26/26]
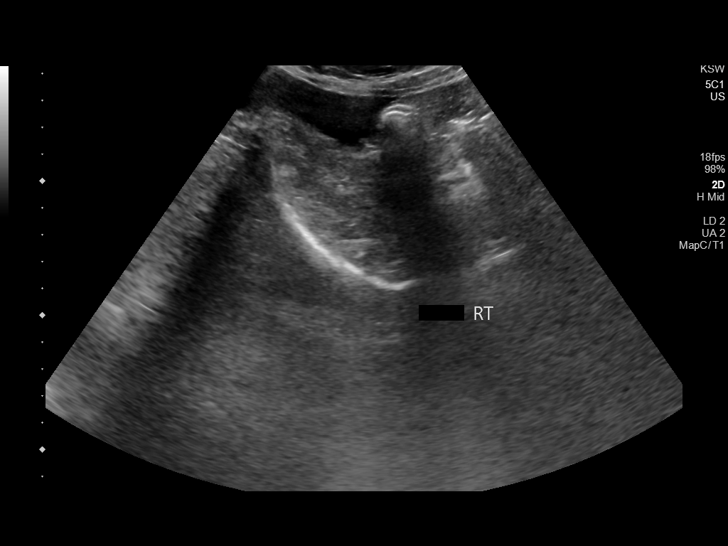

[13 of 25 positions shown; findings below may reference images not displayed]

EXAM:
ULTRASOUND GUIDED THERAPEUTIC PARACENTESIS

MEDICATIONS:
10 mL 1% lidocaine

COMPLICATIONS:
None immediate.

PROCEDURE:
Informed written consent was obtained from the patient after a
discussion of the risks, benefits and alternatives to treatment. A
timeout was performed prior to the initiation of the procedure.

Initial ultrasound scanning demonstrates a large amount of ascites
within the right lower abdominal quadrant. The right lower abdomen
was prepped and draped in the usual sterile fashion. 1% lidocaine
was used for local anesthesia.

Following this, a 19 gauge, 7-cm, Yueh catheter was introduced. An
ultrasound image was saved for documentation purposes. The
paracentesis was performed. The catheter was removed and a dressing
was applied. The patient tolerated the procedure well without
immediate post procedural complication.
Patient received post-procedure intravenous albumin; see nursing
notes for details.
FINDINGS: A total of approximately 3.5 liters of yellow fluid was removed.
IMPRESSION: Successful ultrasound-guided therapeutic paracentesis yielding
liters of peritoneal fluid.

## 2021-03-06 IMAGING — US US PARACENTESIS
1 series · 2 of 2 positions shown · non-contrast
Comparison: none

INDICATION: Patient with history of metastatic carcinoid tumor of the pancreas,
recurrent ascites. Request received for therapeutic paracentesis.

[Series 1: us paracentesis · 2 of 2 slices shown]
[im 1/2]
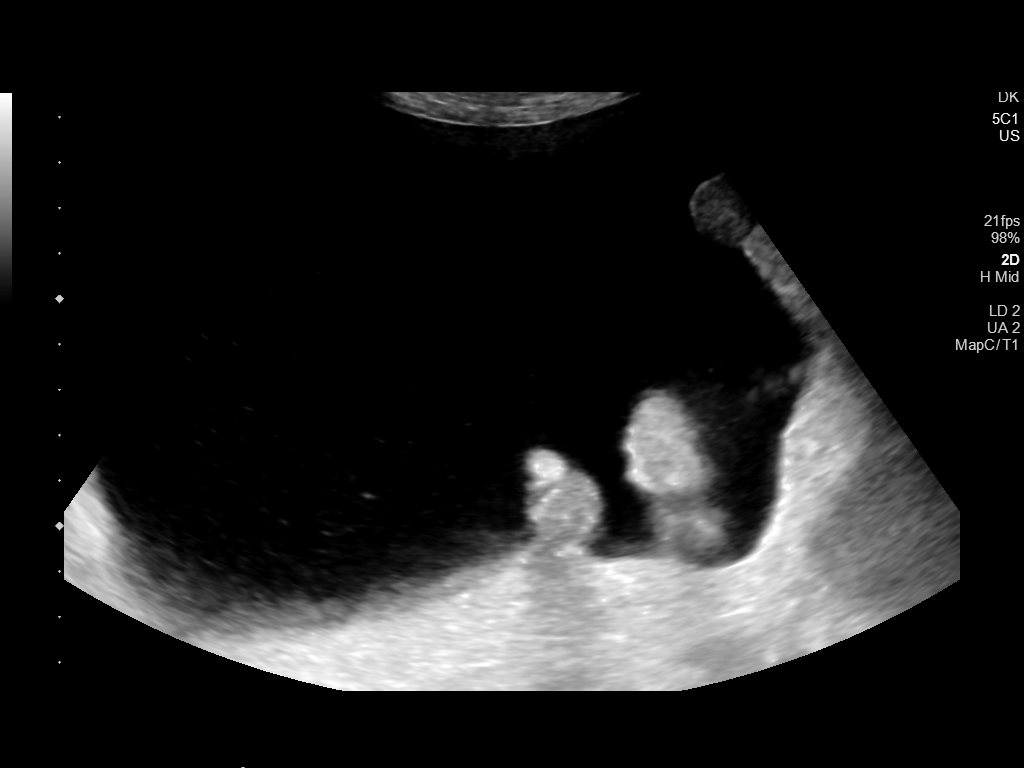
[im 2/2]
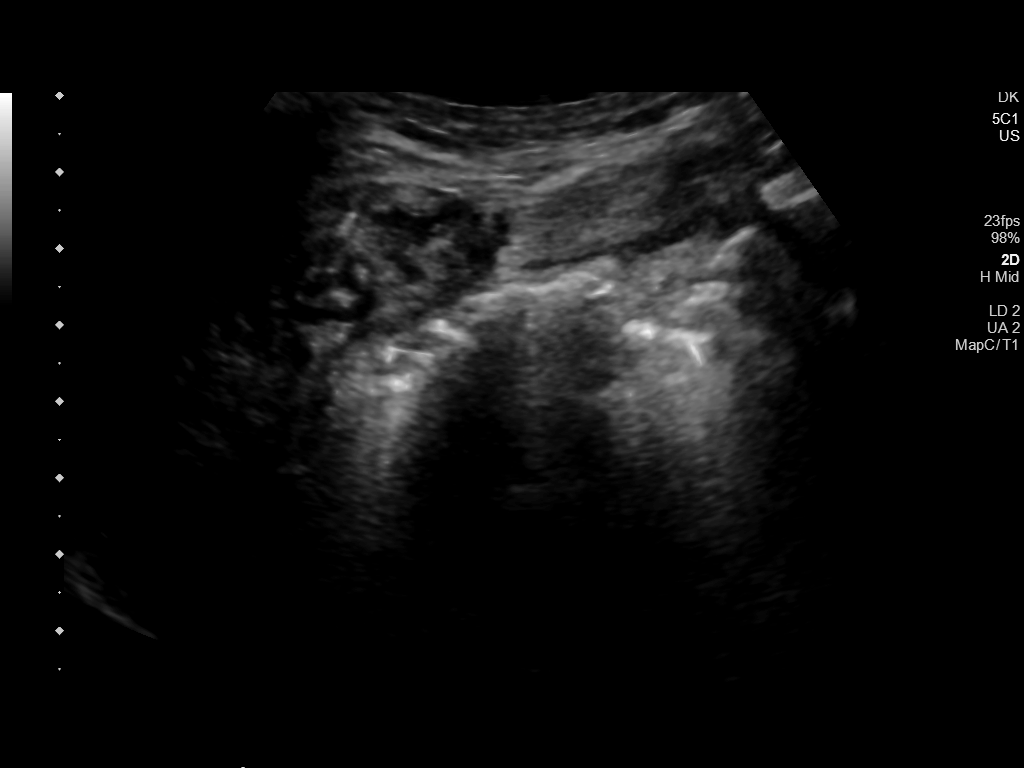

[2 of 2 positions shown; findings below may reference images not displayed]

EXAM:
ULTRASOUND GUIDED THERAPEUTIC PARACENTESIS

MEDICATIONS:
1% lidocaine to skin and subcutaneous tissue

COMPLICATIONS:
None immediate.

PROCEDURE:
Informed written consent was obtained from the patient after a
discussion of the risks, benefits and alternatives to treatment. A
timeout was performed prior to the initiation of the procedure.

Initial ultrasound scanning demonstrates a large amount of ascites
within the right lower abdominal quadrant. The right lower abdomen
was prepped and draped in the usual sterile fashion. 1% lidocaine
was used for local anesthesia.

Following this, a 19 gauge, 7-cm, Yueh catheter was introduced. An
ultrasound image was saved for documentation purposes. The
paracentesis was performed. The catheter was removed and a dressing
was applied. The patient tolerated the procedure well without
immediate post procedural complication.
FINDINGS: A total of approximately 5.2 liters of yellow fluid was removed.
IMPRESSION: Successful ultrasound-guided therapeutic paracentesis yielding
liters of peritoneal fluid.

## 2021-04-17 IMAGING — US IR ABSCESS DRAINAGE
1 series · 1 of 1 positions shown · non-contrast
Comparison: none

INDICATION: 79-year-old male with recurrent ascites referred for a tunneled
peritoneal catheter

[Series 1: ir abscess drainage · 1 of 1 slices shown]
[im 1/1]
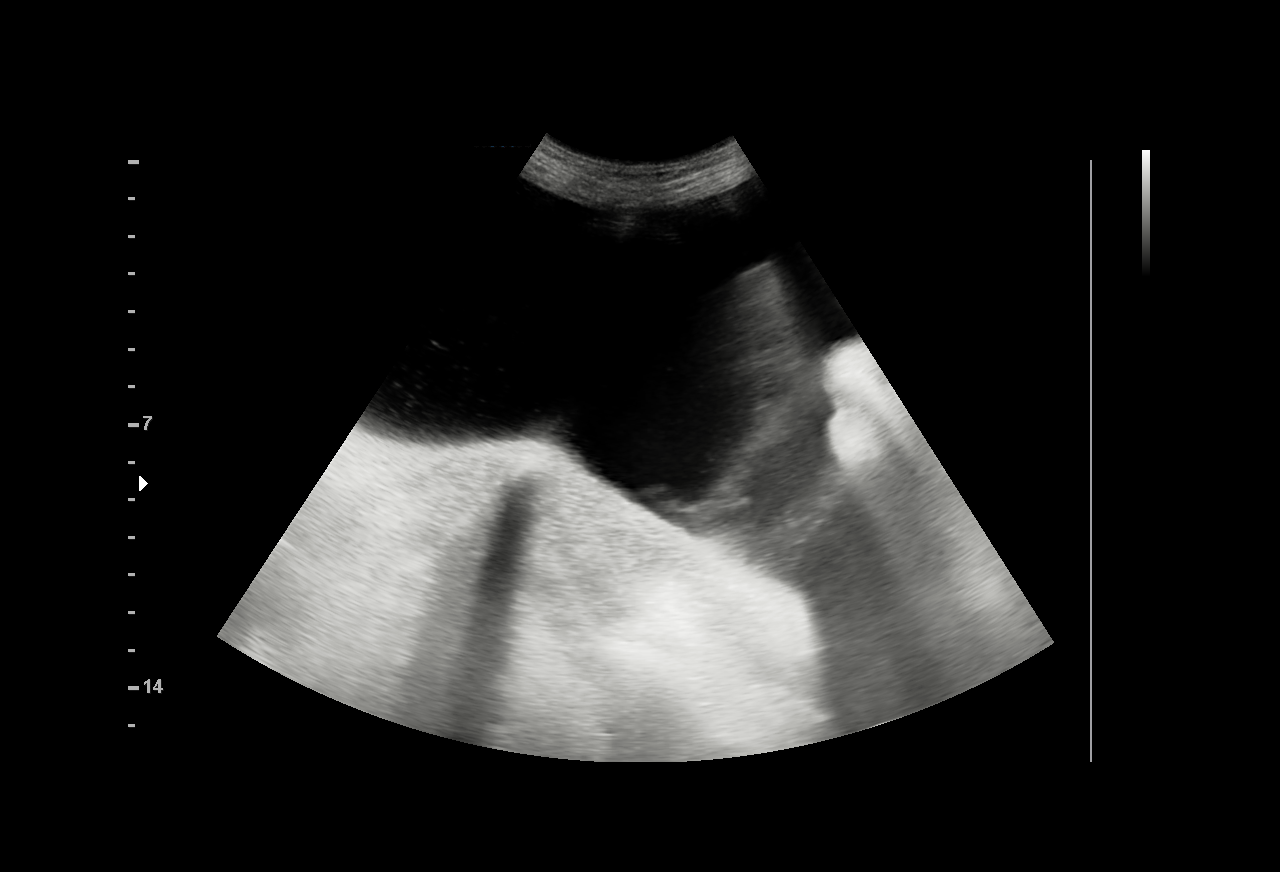

[1 of 1 positions shown; findings below may reference images not displayed]

EXAM:
IMAGE GUIDED TUNNELED PERITONEAL CATHETER

MEDICATIONS:
2 g Ancef

ANESTHESIA/SEDATION:
Fentanyl 25 mcg IV; Versed 1.0 mg IV

Moderate Sedation Time:  14 minutes

The patient was continuously monitored during the procedure by the
interventional radiology nurse under my direct supervision.

COMPLICATIONS:
None

PROCEDURE:
The procedure, risks, benefits, and alternatives were explained to
the patient and the patient's family. Specific risks that were
addressed included bleeding, infection, need for further procedure,
chance of delayed hemorrhage, cardiopulmonary collapse, death.
Questions regarding the procedure were encouraged and answered. The
patient understands and consents to the procedure.

The right abdominal wall was prepped with Betadine in a sterile
fashion, and a sterile drape was applied covering the operative
field. A sterile gown and sterile gloves were used for the
procedure. Local anesthesia was provided with 1% Lidocaine.
Ultrasound image documentation was performed.

After creating a small skin incision, a 19 gauge needle was advanced
into the peritoneal cavity under ultrasound guidance. A guide wire
was then advanced under fluoroscopy into the space. Access was
dilated serially and a 16-French peel-away sheath placed.

The skin and subcutaneous tissues were generously infiltrated with
1% lidocaine from the puncture site along the abdomen anteriorly. A
small stab incision was made with 11 blade scalpel at the insertion
site of the catheter, and the catheter was back tunneled to the site
at the puncture.

A tunneled peritoneal catheter was placed. This was tunneled from
the incision 5 cm anterior to the access to the access site. The
catheter was advanced through the peel-away sheath. The sheath was
then removed. Final catheter positioning was confirmed with a
fluoroscopic spot image.

The access incision was closed with subcutaneous 4-0 Vicryl and
Derma bond. Dermabond was applied to the catheterization incision.
Large volume paracentesis was performed through the new catheter
utilizing vacuum containers.

The patient tolerated the procedure well and remained
hemodynamically stable throughout.

No complications were encountered and no significant blood loss was
encountered.
IMPRESSION: Status post image guided placement of tunneled peritoneal drainage
catheter.
